# Patient Record
Sex: Female | Born: 1945 | Hispanic: Yes | Marital: Single | Smoking: Never smoker
Health system: Southern US, Community
[De-identification: ages and names within clinical notes are randomized; demographics above are authoritative.]

## PROBLEM LIST (undated history)

## (undated) DIAGNOSIS — E119 Type 2 diabetes mellitus without complications: Secondary | ICD-10-CM

## (undated) DIAGNOSIS — I10 Essential (primary) hypertension: Secondary | ICD-10-CM

## (undated) DIAGNOSIS — G40109 Localization-related (focal) (partial) symptomatic epilepsy and epileptic syndromes with simple partial seizures, not intractable, without status epilepticus: Secondary | ICD-10-CM

## (undated) DIAGNOSIS — E785 Hyperlipidemia, unspecified: Secondary | ICD-10-CM

## (undated) DIAGNOSIS — I5032 Chronic diastolic (congestive) heart failure: Secondary | ICD-10-CM

## (undated) DIAGNOSIS — J449 Chronic obstructive pulmonary disease, unspecified: Secondary | ICD-10-CM

## (undated) DIAGNOSIS — I495 Sick sinus syndrome: Secondary | ICD-10-CM

## (undated) DIAGNOSIS — I890 Lymphedema, not elsewhere classified: Secondary | ICD-10-CM

## (undated) DIAGNOSIS — I639 Cerebral infarction, unspecified: Secondary | ICD-10-CM

## (undated) HISTORY — PX: OTHER SURGICAL HISTORY: SHX169

## (undated) HISTORY — PX: APPENDECTOMY: SHX54

## (undated) HISTORY — PX: HERNIA REPAIR: SHX51

## (undated) HISTORY — PX: TONSILLECTOMY: SUR1361

## (undated) HISTORY — PX: BREAST SURGERY: SHX581

## (undated) HISTORY — PX: PACEMAKER IMPLANT: EP1218

---

## 2017-12-20 DIAGNOSIS — Z950 Presence of cardiac pacemaker: Secondary | ICD-10-CM | POA: Insufficient documentation

## 2017-12-20 DIAGNOSIS — Z8673 Personal history of transient ischemic attack (TIA), and cerebral infarction without residual deficits: Secondary | ICD-10-CM | POA: Insufficient documentation

## 2017-12-20 DIAGNOSIS — I10 Essential (primary) hypertension: Secondary | ICD-10-CM | POA: Insufficient documentation

## 2017-12-20 DIAGNOSIS — H409 Unspecified glaucoma: Secondary | ICD-10-CM | POA: Insufficient documentation

## 2017-12-20 DIAGNOSIS — G40909 Epilepsy, unspecified, not intractable, without status epilepticus: Secondary | ICD-10-CM | POA: Insufficient documentation

## 2017-12-20 DIAGNOSIS — R569 Unspecified convulsions: Secondary | ICD-10-CM | POA: Insufficient documentation

## 2017-12-20 DIAGNOSIS — G4730 Sleep apnea, unspecified: Secondary | ICD-10-CM | POA: Insufficient documentation

## 2018-12-12 ENCOUNTER — Other Ambulatory Visit: Payer: Self-pay | Admitting: Family Medicine

## 2018-12-12 DIAGNOSIS — Z13820 Encounter for screening for osteoporosis: Secondary | ICD-10-CM

## 2018-12-12 DIAGNOSIS — Z1231 Encounter for screening mammogram for malignant neoplasm of breast: Secondary | ICD-10-CM

## 2019-11-19 ENCOUNTER — Encounter: Payer: Medicare HMO | Attending: Physician Assistant | Admitting: Physician Assistant

## 2019-11-19 ENCOUNTER — Other Ambulatory Visit: Payer: Self-pay

## 2019-11-19 DIAGNOSIS — I872 Venous insufficiency (chronic) (peripheral): Secondary | ICD-10-CM | POA: Insufficient documentation

## 2019-11-19 DIAGNOSIS — L97822 Non-pressure chronic ulcer of other part of left lower leg with fat layer exposed: Secondary | ICD-10-CM | POA: Insufficient documentation

## 2019-11-19 DIAGNOSIS — L97812 Non-pressure chronic ulcer of other part of right lower leg with fat layer exposed: Secondary | ICD-10-CM | POA: Diagnosis not present

## 2019-11-19 DIAGNOSIS — E11622 Type 2 diabetes mellitus with other skin ulcer: Secondary | ICD-10-CM | POA: Diagnosis not present

## 2019-11-19 DIAGNOSIS — E669 Obesity, unspecified: Secondary | ICD-10-CM | POA: Insufficient documentation

## 2019-11-19 DIAGNOSIS — Z950 Presence of cardiac pacemaker: Secondary | ICD-10-CM | POA: Insufficient documentation

## 2019-11-19 DIAGNOSIS — Z6841 Body Mass Index (BMI) 40.0 and over, adult: Secondary | ICD-10-CM | POA: Insufficient documentation

## 2019-11-19 DIAGNOSIS — I110 Hypertensive heart disease with heart failure: Secondary | ICD-10-CM | POA: Insufficient documentation

## 2019-11-19 DIAGNOSIS — I890 Lymphedema, not elsewhere classified: Secondary | ICD-10-CM | POA: Insufficient documentation

## 2019-11-19 DIAGNOSIS — L97819 Non-pressure chronic ulcer of other part of right lower leg with unspecified severity: Secondary | ICD-10-CM | POA: Diagnosis present

## 2019-11-19 DIAGNOSIS — I5042 Chronic combined systolic (congestive) and diastolic (congestive) heart failure: Secondary | ICD-10-CM | POA: Insufficient documentation

## 2019-11-19 NOTE — Progress Notes (Signed)
SHUNTE, Grace Short ([id number]) Visit Report for 11/19/2019 Allergy List Details Patient Name: Grace, Short Date of Service: 11/19/2019 8:00 AM Medical Record Number: [id number] Patient Account Number: [id number] Date of Birth/Sex: 02-10-1946 (74 y.o. F) Treating RN: Huel Coventry Primary Care Eltha Tingley: Dala Dock Other Clinician: Referring Ellias Mcelreath: Dorothyann Peng Treating Ashtyn Freilich/Extender: STONE III, HOYT Weeks in Treatment: 0 Allergies Active Allergies Ativan Reaction: too sedating Allergy Notes Electronic Signature(s) Signed: 11/19/2019 11:49:13 AM By: Benna Dunks Entered By: Benna Dunks on 11/19/2019 09:16:18 Grace Short ([id number]) -------------------------------------------------------------------------------- Arrival Information Details Patient Name: Grace Short Date of Service: 11/19/2019 8:00 AM Medical Record Number: [id number] Patient Account Number: [id number] Date of Birth/Sex: 1945-05-26 (74 y.o. F) Treating RN: Huel Coventry Primary Care Massimiliano Rohleder: Dala Dock Other Clinician: Referring Rozetta Stumpp: Dorothyann Peng Treating Hansel Devan/Extender: Linwood Dibbles, HOYT Weeks in Treatment: 0 Visit Information Patient Arrived: Wheel Chair Arrival Time: 08:39 Accompanied By: friend Transfer Assistance: None Patient Identification Verified: Yes Secondary Verification Process Completed: Yes Electronic Signature(s) Signed: 11/19/2019 11:49:13 AM By: Benna Dunks Entered By: Benna Dunks on 11/19/2019 09:01:53 Grace Short ([id number]) -------------------------------------------------------------------------------- Clinic Level of Care Assessment Details Patient Name: Grace Short Date of Service: 11/19/2019 8:00 AM Medical Record Number: [id number] Patient Account Number: [id number] Date of Birth/Sex: Jun 03, 1945 (74 y.o. F) Treating RN: Tyler Aas Primary Care Ryon Layton: Dala Dock Other Clinician: Referring Dejan Angert: Dorothyann Peng Treating  Valicia Rief/Extender: Linwood Dibbles, HOYT Weeks in Treatment: 0 Clinic Level of Care Assessment Items TOOL 1 Quantity Score []  - Use when EandM and Procedure is performed on INITIAL visit 0 ASSESSMENTS - Nursing Assessment / Reassessment X - General Physical Exam (combine w/ comprehensive assessment (listed just below) when performed on new 1 20 pt. evals) X- 1 25 Comprehensive Assessment (HX, ROS, Risk Assessments, Wounds Hx, etc.) ASSESSMENTS - Wound and Skin Assessment / Reassessment []  - Dermatologic / Skin Assessment (not related to wound area) 0 ASSESSMENTS - Ostomy and/or Continence Assessment and Care []  - Incontinence Assessment and Management 0 []  - 0 Ostomy Care Assessment and Management (repouching, etc.) PROCESS - Coordination of Care []  - Simple Patient / Family Education for ongoing care 0 X- 1 20 Complex (extensive) Patient / Family Education for ongoing care X- 1 10 Staff obtains , Records, Test Results / Process Orders []  - 0 Staff telephones HHA, Nursing Homes / Clarify orders / etc []  - 0 Routine Transfer to another Facility (non-emergent condition) []  - 0 Routine Hospital Admission (non-emergent condition) X- 1 15 New Admissions / / Ordering NPWT, Apligraf, etc. []  - 0 Emergency Hospital Admission (emergent condition) PROCESS - Special Needs []  - Pediatric / Minor Patient Management 0 []  - 0 Isolation Patient Management []  - 0 Hearing / Language / Visual special needs []  - 0 Assessment of Community assistance (transportation, D/C planning, etc.) []  - 0 Additional assistance / Altered mentation []  - 0 Support Surface(s) Assessment (bed, cushion, seat, etc.) INTERVENTIONS - Miscellaneous []  - External ear exam 0 []  - 0 Patient Transfer (multiple staff / / Similar devices) []  - 0 Simple Staple / Suture removal (25 or less) []  - 0 Complex Staple / Suture removal (26 or more) []  - 0 Hypo/Hyperglycemic  Management (do not check if billed separately) []  - 0 Ankle / Brachial Index (ABI) - do not check if billed separately Has the patient been seen at the hospital within the last three years: Yes Total Score: 90 Level Of Care: New/Established - Level 3 Grace Short, Grace V. ( )  Electronic Signature(s) Signed: 11/19/2019 4:44:41 PM By: Tyler Aas Entered By: Tyler Aas on 11/19/2019 10:17:11 Grace Short ([id number]) -------------------------------------------------------------------------------- Compression Therapy Details Patient Name: Grace Short Date of Service: 11/19/2019 8:00 AM Medical Record Number: [id number] Patient Account Number: [id number] Date of Birth/Sex: March 16, 1945 (74 y.o. F) Treating RN: Tyler Aas Primary Care Jakeira Seeman: Dala Dock Other Clinician: Referring Tison Leibold: Dorothyann Peng Treating Shelva Hetzer/Extender: Linwood Dibbles, HOYT Weeks in Treatment: 0 Compression Therapy Performed for Wound Assessment: Wound #1 Right,Distal,Medial Lower Leg Performed By: Clinician Tyler Aas, RN Compression Type: Henriette Combs Post Procedure Diagnosis Same as Pre-procedure Electronic Signature(s) Signed: 11/19/2019 4:44:41 PM By: Tyler Aas Entered By: Tyler Aas on 11/19/2019 10:16:20 Grace Short ([id number]) -------------------------------------------------------------------------------- Compression Therapy Details Patient Name: Grace Short Date of Service: 11/19/2019 8:00 AM Medical Record Number: [id number] Patient Account Number: [id number] Date of Birth/Sex: 07/28/1945 (74 y.o. F) Treating RN: Tyler Aas Primary Care Timea Breed: Dala Dock Other Clinician: Referring Leotha Westermeyer: Dorothyann Peng Treating Madia Carvell/Extender: Linwood Dibbles, HOYT Weeks in Treatment: 0 Compression Therapy Performed for Wound Assessment: Wound #2 Left,Distal,Medial Lower Leg Performed By: Clinician Tyler Aas, RN Compression Type: Henriette Combs Post  Procedure Diagnosis Same as Pre-procedure Electronic Signature(s) Signed: 11/19/2019 4:44:41 PM By: Tyler Aas Entered By: Tyler Aas on 11/19/2019 10:16:21 Grace Short ([id number]) -------------------------------------------------------------------------------- Encounter Discharge Information Details Patient Name: Grace Short Date of Service: 11/19/2019 8:00 AM Medical Record Number: [id number] Patient Account Number: [id number] Date of Birth/Sex: Jul 06, 1945 (74 y.o. F) Treating RN: Tyler Aas Primary Care Parlee Amescua: Dala Dock Other Clinician: Referring Chipper Koudelka: Dorothyann Peng Treating Adon Gehlhausen/Extender: Linwood Dibbles, HOYT Weeks in Treatment: 0 Encounter Discharge Information Items Discharge Condition: Stable Ambulatory Status: Wheelchair Discharge Destination: Home Transportation: Other Accompanied By: caregiver Schedule Follow-up Appointment: Yes Clinical Summary of Care: Electronic Signature(s) Signed: 11/19/2019 4:44:41 PM By: Tyler Aas Entered By: Tyler Aas on 11/19/2019 10:18:35 Grace Short ([id number]) -------------------------------------------------------------------------------- Lower Extremity Assessment Details Patient Name: Grace Short Date of Service: 11/19/2019 8:00 AM Medical Record Number: [id number] Patient Account Number: [id number] Date of Birth/Sex: 11/30/1945 (74 y.o. F) Treating RN: Huel Coventry Primary Care Maryln Eastham: Dala Dock Other Clinician: Referring Shelba Susi: Dorothyann Peng Treating Keniel Ralston/Extender: Linwood Dibbles, HOYT Weeks in Treatment: 0 Edema Assessment Assessed: [Left: Yes] [Right: Yes] Edema: [Left: Yes] [Right: Yes] Calf Left: Right: Point of Measurement: 24 cm From Medial Instep 55 cm 54 cm Ankle Left: Right: Point of Measurement: 12 cm From Medial Instep 44 cm 49 cm Vascular Assessment Pulses: Dorsalis Pedis Palpable: [Left:No] [Right:No] Doppler Audible: [Left:Inaudible]  [Right:Inaudible] Posterior Tibial Palpable: [Left:No Inaudible] [Right:No Inaudible] Notes Unable to obtain ABIs due to significant edema and inaudible doppler pulses. Has referral with vascular from her cardiologist. Electronic Signature(s) Signed: 11/19/2019 11:49:13 AM By: Benna Dunks Signed: 11/19/2019 4:28:55 PM By: Elliot Gurney, BSN, RN, CWS, Kim RN, BSN Entered By: Benna Dunks on 11/19/2019 09:15:26 Grace Short ([id number]) -------------------------------------------------------------------------------- Multi Wound Chart Details Patient Name: Grace Short Date of Service: 11/19/2019 8:00 AM Medical Record Number: [id number] Patient Account Number: [id number] Date of Birth/Sex: [birth year] (74 y.o. F) Treating RN: Tyler Aas Primary Care Christabel Camire: Dala Dock Other Clinician: Referring Rozalynn Buege: Dorothyann Peng Treating Kentrail Shew/Extender: Linwood Dibbles, HOYT Weeks in Treatment: 0 Vital Signs Height(in): 62 Pulse(bpm): 73 Weight(lbs): 300 Blood Pressure(mmHg): 205/83 Body Mass Index(BMI): 55 Temperature(F): 98.3 Respiratory Rate(breaths/min): 20 Photos: [N/A:N/A] Wound Location: Right, Distal, Medial Lower Leg Left, Distal, Medial Lower Leg N/A Wounding Event: Gradually Appeared Gradually Appeared N/A Primary Etiology: Lymphedema Lymphedema N/A Date Acquired: 11/12/2019 11/12/2019 N/A  Weeks of Treatment: 0 0 N/A Wound Status: Open Open N/A Measurements L x W x D (cm) 5.5x10x0.1 6x6x0.1 N/A Area (cm) : 43.197 28.274 N/A Volume (cm) : 4.32 2.827 N/A % Reduction in Area: -52.80% N/A N/A % Reduction in Volume: -52.80% N/A N/A Classification: Partial Thickness Partial Thickness N/A Exudate Amount: Large Large N/A Exudate Type: Serous Serous N/A Exudate Color: amber amber N/A Foul Odor After Cleansing: Yes Yes N/A Odor Anticipated Due to Product No No N/A Use: Wound Margin: Distinct, outline attached Distinct, outline attached N/A Granulation Amount: Small  (1-33%) Small (1-33%) N/A Granulation Quality: Pink Pink N/A Necrotic Amount: Large (67-100%) Large (67-100%) N/A Exposed Structures: Fat Layer (Subcutaneous Tissue): Fat Layer (Subcutaneous Tissue): N/A Yes Yes Fascia: No Fascia: No Tendon: No Tendon: No Muscle: No Muscle: No Joint: No Joint: No Bone: No Bone: No Epithelialization: Small (1-33%) Small (1-33%) N/A Treatment Notes Electronic Signature(s) Signed: 11/19/2019 4:44:41 PM By: Tyler Aas Entered By: Tyler Aas on 11/19/2019 10:00:19 Grace Short ([id number]) -------------------------------------------------------------------------------- Multi-Disciplinary Care Plan Details Patient Name: Grace Short Date of Service: 11/19/2019 8:00 AM Medical Record Number: [id number] Patient Account Number: [id number] Date of Birth/Sex: 1946/02/06 (74 y.o. F) Treating RN: Huel Coventry Primary Care Merline Perkin: Dala Dock Other Clinician: Referring Josefina Rynders: Dorothyann Peng Treating Jonne Rote/Extender: Linwood Dibbles, HOYT Weeks in Treatment: 0 Active Inactive Orientation to the Wound Care Program Nursing Diagnoses: Knowledge deficit related to the wound healing center program Goals: Patient/caregiver will verbalize understanding of the Wound Healing Center Program Date Initiated: 11/19/2019 Target Resolution Date: 11/26/2019 Goal Status: Active Interventions: Provide education on orientation to the wound center Notes: Wound/Skin Impairment Nursing Diagnoses: Impaired tissue integrity Knowledge deficit related to ulceration/compromised skin integrity Goals: Patient/caregiver will verbalize understanding of skin care regimen Date Initiated: 11/19/2019 Target Resolution Date: 12/03/2019 Goal Status: Active Interventions: Assess patient/caregiver ability to obtain necessary supplies Assess patient/caregiver ability to perform ulcer/skin care regimen upon admission and as needed Assess ulceration(s) every  visit Provide education on ulcer and skin care Treatment Activities: Skin care regimen initiated : 11/19/2019 Topical wound management initiated : 11/19/2019 Notes: Electronic Signature(s) Signed: 11/19/2019 4:28:55 PM By: Elliot Gurney, BSN, RN, CWS, Kim RN, BSN Signed: 11/19/2019 4:44:41 PM By: Tyler Aas Entered By: Tyler Aas on 11/19/2019 09:59:11 Grace Short ([id number]) -------------------------------------------------------------------------------- Pain Assessment Details Patient Name: Grace Short Date of Service: 11/19/2019 8:00 AM Medical Record Number: [id number] Patient Account Number: [id number] Date of Birth/Sex: [birth year] (74 y.o. F) Treating RN: Huel Coventry Primary Care Brennin Durfee: Dala Dock Other Clinician: Referring Stevi Hollinshead: Dorothyann Peng Treating Haifa Hatton/Extender: Linwood Dibbles, HOYT Weeks in Treatment: 0 Active Problems Location of Pain Severity and Description of Pain Patient Has Paino Yes Site Locations Rate the pain. Current Pain Level: 5 Pain Management and Medication Current Pain Management: Electronic Signature(s) Signed: 11/19/2019 11:49:13 AM By: Benna Dunks Signed: 11/19/2019 4:28:55 PM By: Elliot Gurney, BSN, RN, CWS, Kim RN, BSN Entered By: Benna Dunks on 11/19/2019 09:02:02 Grace Short ([id number]) -------------------------------------------------------------------------------- Patient/Caregiver Education Details Patient Name: Grace Short Date of Service: 11/19/2019 8:00 AM Medical Record Number: [id number] Patient Account Number: [id number] Date of Birth/Gender: 24-Oct-1945 (74 y.o. F) Treating RN: Tyler Aas Primary Care Physician: Dala Dock Other Clinician: Referring Physician: Dorothyann Peng Treating Physician/Extender: Skeet Simmer in Treatment: 0 Education Assessment Education Provided To: Patient and Caregiver Education Topics Provided Welcome To The Wound Care Center: Handouts: Welcome To The Wound  Care Center Methods: Explain/Verbal Responses: State content correctly Wound/Skin Impairment: Handouts: Caring for Your Ulcer  Methods: Demonstration Responses: State content correctly Electronic Signature(s) Signed: 11/19/2019 4:44:41 PM By: Tyler AasButler, Michelle Entered By: Tyler AasButler, Michelle on 11/19/2019 09:59:46 Grace Short, Grace Short V. ([id number]) -------------------------------------------------------------------------------- Wound Assessment Details Patient Name: Grace Short, Grace V. Date of Service: 11/19/2019 8:00 AM Medical Record Number: [id number] Patient Account Number: [id number] Date of Birth/Sex: 20-Mar-1945 (74 y.o. F) Treating RN: Huel CoventryWoody, Kim Primary Care Magnum Lunde: Dala DockMARKLY, LINDA Other Clinician: Referring Heitor Steinhoff: Dorothyann PengALLWOOD, DWAYNE Treating Addie Alonge/Extender: STONE III, HOYT Weeks in Treatment: 0 Wound Status Wound Number: 1 Primary Lymphedema Etiology: Wound Location: Right, Distal, Medial Lower Leg Wound Open Wounding Event: Gradually Appeared Status: Date Acquired: 11/12/2019 Notes: Stated her legs started weeping about a week ago and Weeks Of Treatment: 0 ulceration appeared. Clustered Wound: No Photos Wound Measurements Length: (cm) 5.5 Width: (cm) 10 Depth: (cm) 0.1 Area: (cm) 43.197 Volume: (cm) 4.32 % Reduction in Area: -52.8% % Reduction in Volume: -52.8% Epithelialization: Small (1-33%) Tunneling: No Undermining: No Wound Description Classification: Partial Thickness Wound Margin: Distinct, outline attached Exudate Amount: Large Exudate Type: Serous Exudate Color: amber Foul Odor After Cleansing: Yes Due to Product Use: No Slough/Fibrino Yes Wound Bed Granulation Amount: Small (1-33%) Exposed Structure Granulation Quality: Pink Fascia Exposed: No Necrotic Amount: Large (67-100%) Fat Layer (Subcutaneous Tissue) Exposed: Yes Necrotic Quality: Adherent Slough Tendon Exposed: No Muscle Exposed: No Joint Exposed: No Bone Exposed: No Treatment Notes Wound  #1 (Right, Distal, Medial Lower Leg) Notes scell, xsorb (abd to bilateral lower extremity folds prior to unna boot application, unna bilateral Electronic Signature(s) Signed: 11/19/2019 11:49:13 AM By: Tommy RainwaterLineberry, Carol Rentz, Marchel V. ([id number]) Signed: 11/19/2019 4:28:55 PM By: Elliot GurneyWoody, BSN, RN, CWS, Kim RN, BSN Entered By: Benna DunksLineberry, Carol on 11/19/2019 09:10:56 Grace Short, Grace V. ([id number]) -------------------------------------------------------------------------------- Wound Assessment Details Patient Name: Grace Short, Malayjah V. Date of Service: 11/19/2019 8:00 AM Medical Record Number: [id number] Patient Account Number: [id number] Date of Birth/Sex: 20-Mar-1945 (74 y.o. F) Treating RN: Huel CoventryWoody, Kim Primary Care Joellyn Grandt: Dala DockMARKLY, LINDA Other Clinician: Referring Anyssa Sharpless: Dorothyann PengALLWOOD, DWAYNE Treating Ricca Melgarejo/Extender: Linwood DibblesSTONE III, HOYT Weeks in Treatment: 0 Wound Status Wound Number: 2 Primary Lymphedema Etiology: Wound Location: Left, Distal, Medial Lower Leg Wound Status: Open Wounding Event: Gradually Appeared Notes: states weeping started aprox one week ago and Date Acquired: 11/12/2019 development of ulcer Weeks Of Treatment: 0 Clustered Wound: No Photos Wound Measurements Length: (cm) 6 Width: (cm) 6 Depth: (cm) 0.1 Area: (cm) 28.274 Volume: (cm) 2.827 % Reduction in Area: % Reduction in Volume: Epithelialization: Small (1-33%) Tunneling: No Undermining: No Wound Description Classification: Partial Thickness Wound Margin: Distinct, outline attached Exudate Amount: Large Exudate Type: Serous Exudate Color: amber Foul Odor After Cleansing: Yes Due to Product Use: No Slough/Fibrino Yes Wound Bed Granulation Amount: Small (1-33%) Exposed Structure Granulation Quality: Pink Fascia Exposed: No Necrotic Amount: Large (67-100%) Fat Layer (Subcutaneous Tissue) Exposed: Yes Necrotic Quality: Adherent Slough Tendon Exposed: No Muscle Exposed: No Joint Exposed: No Bone Exposed:  No Treatment Notes Wound #2 (Left, Distal, Medial Lower Leg) Notes scell, xsorb (abd to bilateral lower extremity folds prior to unna boot application, unna bilateral Electronic Signature(s) Signed: 11/19/2019 11:49:13 AM By: Tommy RainwaterLineberry, Carol Mcgeachy, Shali V. ([id number]) Signed: 11/19/2019 4:28:55 PM By: Elliot GurneyWoody, BSN, RN, CWS, Kim RN, BSN Entered By: Benna DunksLineberry, Carol on 11/19/2019 09:10:20 Grace Short, Grace V. ([id number]) -------------------------------------------------------------------------------- Vitals Details Patient Name: Grace Short, Grace V. Date of Service: 11/19/2019 8:00 AM Medical Record Number: [id number] Patient Account Number: [id number] Date of Birth/Sex: 20-Mar-1945 (74 y.o. F) Treating RN: Huel CoventryWoody, Kim Primary Care Sheran Newstrom: Dala DockMARKLY, LINDA Other Clinician: Referring Nadya Hopwood: Juliann ParesALLWOOD,  DWAYNE Treating Dametria Tuzzolino/Extender: STONE III, HOYT Weeks in Treatment: 0 Vital Signs Time Taken: 08:35 Temperature (F): 98.3 Height (in): 62 Pulse (bpm): 73 Source: Stated Respiratory Rate (breaths/min): 20 Weight (lbs): 300 Blood Pressure (mmHg): 205/83 Source: Stated Reference Range: 80 - 120 mg / dl Body Mass Index (BMI): 54.9 Electronic Signature(s) Signed: 11/19/2019 11:49:13 AM By: Benna Dunks Entered By: Benna Dunks on 11/19/2019 09:02:07

## 2019-11-19 NOTE — Progress Notes (Signed)
CREEDENCE, HEISS ([id number]) Visit Report for 11/19/2019 Abuse/Suicide Risk Screen Details Patient Name: Grace Short, Grace Short Date of Service: 11/19/2019 8:00 AM Medical Record Number: [id number] Patient Account Number: [id number] Date of Birth/Sex: 24-Sep-1945 (74 y.o. F) Treating RN: Huel Coventry Primary Care Kimoni Pickerill: Dala Dock Other Clinician: Referring Tyler Cubit: Dorothyann Peng Treating Areej Tayler/Extender: Linwood Dibbles, HOYT Weeks in Treatment: 0 Abuse/Suicide Risk Screen Items Answer ABUSE RISK SCREEN: Has anyone close to you tried to hurt or harm you recentlyo No Do you feel uncomfortable with anyone in your familyo No Has anyone forced you do things that you didnot want to doo No Electronic Signature(s) Signed: 11/19/2019 11:49:13 AM By: Benna Dunks Signed: 11/19/2019 4:28:55 PM By: Elliot Gurney, BSN, RN, CWS, Kim RN, BSN Entered By: Benna Dunks on 11/19/2019 09:29:33 Grace Short ([id number]) -------------------------------------------------------------------------------- Activities of Daily Living Details Patient Name: Grace Short Date of Service: 11/19/2019 8:00 AM Medical Record Number: [id number] Patient Account Number: [id number] Date of Birth/Sex: [birth year] (74 y.o. F) Treating RN: Huel Coventry Primary Care Thierno Hun: Dala Dock Other Clinician: Referring Elyce Zollinger: Dorothyann Peng Treating Tashera Montalvo/Extender: Linwood Dibbles, HOYT Weeks in Treatment: 0 Activities of Daily Living Items Answer Activities of Daily Living (Please select one for each item) Drive Automobile Not Able Take Medications Need Assistance Use Telephone Completely Able Care for Appearance Need Assistance Use Toilet Need Assistance Bath / Shower Need Assistance Dress Self Need Assistance Feed Self Completely Able Walk Need Assistance Get In / Out Bed Need Assistance Housework Need Assistance Prepare Meals Need Assistance Handle Money Need Assistance Shop for Self Need Assistance Electronic  Signature(s) Signed: 11/19/2019 11:49:13 AM By: Benna Dunks Signed: 11/19/2019 4:28:55 PM By: Elliot Gurney, BSN, RN, CWS, Kim RN, BSN Entered By: Benna Dunks on 11/19/2019 09:31:34 Grace Short ([id number]) -------------------------------------------------------------------------------- Education Screening Details Patient Name: Grace Short Date of Service: 11/19/2019 8:00 AM Medical Record Number: [id number] Patient Account Number: [id number] Date of Birth/Sex: 1945/08/12 (74 y.o. F) Treating RN: Huel Coventry Primary Care Carrol Hougland: Dala Dock Other Clinician: Referring Darik Massing: Dorothyann Peng Treating Charnee Turnipseed/Extender: Linwood Dibbles, HOYT Weeks in Treatment: 0 Learning Preferences/Education Level/Primary Language Learning Preference: Explanation Highest Education Level: High School Preferred Language: English Cognitive Barrier Language Barrier: No Translator Needed: No Memory Deficit: No Emotional Barrier: No Cultural/Religious Beliefs Affecting Medical Care: No Physical Barrier Impaired Vision: No Impaired Hearing: No Decreased Hand dexterity: No Knowledge/Comprehension Knowledge Level: Medium Comprehension Level: Medium Ability to understand written instructions: Medium Ability to understand verbal instructions: Medium Motivation Anxiety Level: Calm Cooperation: Cooperative Education Importance: Acknowledges Need Interest in Health Problems: Asks Questions Perception: Coherent Willingness to Engage in Self-Management Medium Activities: Readiness to Engage in Self-Management Medium Activities: Electronic Signature(s) Signed: 11/19/2019 11:49:13 AM By: Benna Dunks Signed: 11/19/2019 4:28:55 PM By: Elliot Gurney, BSN, RN, CWS, Kim RN, BSN Entered By: Benna Dunks on 11/19/2019 09:32:30 Grace Short ([id number]) -------------------------------------------------------------------------------- Fall Risk Assessment Details Patient Name: Grace Short Date of  Service: 11/19/2019 8:00 AM Medical Record Number: [id number] Patient Account Number: [id number] Date of Birth/Sex: 06-25-1945 (74 y.o. F) Treating RN: Huel Coventry Primary Care Christopherjame Carnell: Dala Dock Other Clinician: Referring Harlo Fabela: Dorothyann Peng Treating Sira Adsit/Extender: Linwood Dibbles, HOYT Weeks in Treatment: 0 Fall Risk Assessment Items Have you had 2 or more falls in the last 12 monthso 0 Yes Have you had any fall that resulted in injury in the last 12 monthso 0 Yes FALLS RISK SCREEN History of falling - immediate or within 3 months 0 No Secondary diagnosis (Do you have 2 or more medical  diagnoseso) 0 No Ambulatory aid None/bed rest/wheelchair/nurse 0 Yes Crutches/cane/walker 0 No Furniture 0 No Intravenous therapy Access/Saline/Heparin Lock 0 No Gait/Transferring Normal/ bed rest/ wheelchair 0 Yes Weak (short steps with or without shuffle, stooped but able to lift head while walking, may 10 Yes seek support from furniture) Impaired (short steps with shuffle, may have difficulty arising from chair, head down, impaired 0 No balance) Mental Status Oriented to own ability 0 Yes Electronic Signature(s) Signed: 11/19/2019 11:49:13 AM By: Benna Dunks Signed: 11/19/2019 4:28:55 PM By: Elliot Gurney, BSN, RN, CWS, Kim RN, BSN Entered By: Benna Dunks on 11/19/2019 09:33:28 Grace Short ([id number]) -------------------------------------------------------------------------------- Foot Assessment Details Patient Name: Grace Short Date of Service: 11/19/2019 8:00 AM Medical Record Number: [id number] Patient Account Number: [id number] Date of Birth/Sex: [birth year] (74 y.o. F) Treating RN: Huel Coventry Primary Care Anees Vanecek: Dala Dock Other Clinician: Referring Ahmoni Edge: Dorothyann Peng Treating Damien Batty/Extender: Linwood Dibbles, HOYT Weeks in Treatment: 0 Foot Assessment Items Site Locations + = Sensation present, - = Sensation absent, C = Callus, U = Ulcer R = Redness, W =  Warmth, M = Maceration, PU = Pre-ulcerative lesion F = Fissure, S = Swelling, D = Dryness Assessment Right: Left: Other Deformity: No No Prior Foot Ulcer: No No Prior Amputation: No No Charcot Joint: No No Ambulatory Status: Ambulatory With Help Assistance Device: Walker Gait: Surveyor, mining) Signed: 11/19/2019 11:49:13 AM By: Benna Dunks Signed: 11/19/2019 4:28:55 PM By: Elliot Gurney, BSN, RN, CWS, Kim RN, BSN Entered By: Benna Dunks on 11/19/2019 09:35:59 Grace Short ([id number]) -------------------------------------------------------------------------------- Nutrition Risk Screening Details Patient Name: Grace Short Date of Service: 11/19/2019 8:00 AM Medical Record Number: [id number] Patient Account Number: [id number] Date of Birth/Sex: 1945/11/18 (74 y.o. F) Treating RN: Huel Coventry Primary Care Mckenzee Beem: Dala Dock Other Clinician: Referring Mithcell Schumpert: Dorothyann Peng Treating Sonita Michiels/Extender: Linwood Dibbles, HOYT Weeks in Treatment: 0 Height (in): 62 Weight (lbs): 300 Body Mass Index (BMI): 54.9 Nutrition Risk Screening Items Score Screening NUTRITION RISK SCREEN: I have an illness or condition that made me change the kind and/or amount of food I eat 0 No I eat fewer than two meals per day 0 No I eat few fruits and vegetables, or milk products 0 No I have three or more drinks of beer, liquor or wine almost every day 0 No I have tooth or mouth problems that make it hard for me to eat 0 No I don't always have enough money to buy the food I need 0 No I eat alone most of the time 0 No I take three or more different prescribed or over-the-counter drugs a day 1 Yes Without wanting to, I have lost or gained 10 pounds in the last six months 0 No I am not always physically able to shop, cook and/or feed myself 0 No Nutrition Protocols Good Risk Protocol 0 No interventions needed Moderate Risk Protocol High Risk Proctocol Risk Level: Good Risk Score:  1 Electronic Signature(s) Signed: 11/19/2019 11:49:13 AM By: Benna Dunks Signed: 11/19/2019 4:28:55 PM By: Elliot Gurney, BSN, RN, CWS, Kim RN, BSN Entered By: Benna Dunks on 11/19/2019 09:33:58

## 2019-11-19 NOTE — Progress Notes (Addendum)
JAMAIA, BRUM ([id number]) Visit Report for 11/19/2019 Chief Complaint Document Details Patient Name: Grace Short, Grace Short Date of Service: 11/19/2019 8:00 AM Medical Record Number: [id number] Patient Account Number: [id number] Date of Birth/Sex: [birth year] (74 y.o. F) Treating RN: Huel Coventry Primary Care Provider: Dala Dock Other Clinician: Referring Provider: Dala Dock Treating Provider/Extender: Linwood Dibbles, Ajiah Mcglinn Weeks in Treatment: 0 Information Obtained from: Patient Chief Complaint Bilateral LE Ulcers Electronic Signature(s) Signed: 11/19/2019 9:52:02 AM By: Lenda Kelp PA-C Entered By: Lenda Kelp on 11/19/2019 09:52:02 Grace Short ([id number]) -------------------------------------------------------------------------------- HPI Details Patient Name: Grace Short Date of Service: 11/19/2019 8:00 AM Medical Record Number: [id number] Patient Account Number: [id number] Date of Birth/Sex: October 10, 1945 (74 y.o. F) Treating RN: Tyler Aas Primary Care Provider: Dala Dock Other Clinician: Referring Provider: Dala Dock Treating Provider/Extender: Linwood Dibbles, Tona Qualley Weeks in Treatment: 0 History of Present Illness HPI Description: 11/19/19 upon evaluation today patient appears to be present for initial inspection here clinic due to bilateral lower extremity lymphedema where she has open areas noted on the medial aspects of her lower legs bilaterally. Fortunately there is no signs of active infection at this time. She's been tolerating the dressing changes without complication. With that being said I do feel like that the patient is somewhat uncontrolled in regard to her lymphedema I think there are a couple options we could consider in this regard will discuss those greater in the plan. She does have a history again of lymphedema, chronic venous insufficiency, diabetes mellitus type II, she has a cardiac pacemaker, hypertension, and chronic congestive heart failure.  Currently the patient has not had any compression of any kind she fairly recently over the past couple of years move from up [place]. She has not been taught lymphedema clinic up to this point. Electronic Signature(s) Signed: 11/30/2019 9:48:54 AM By: Lenda Kelp PA-C Entered By: Lenda Kelp on 11/30/2019 09:33:13 Grace Short ([id number]) -------------------------------------------------------------------------------- Physical Exam Details Patient Name: Grace Short Date of Service: 11/19/2019 8:00 AM Medical Record Number: [id number] Patient Account Number: [id number] Date of Birth/Sex: 02-17-1946 (74 y.o. F) Treating RN: Tyler Aas Primary Care Provider: Dala Dock Other Clinician: Referring Provider: Dala Dock Treating Provider/Extender: STONE III, Judit Awad Weeks in Treatment: 0 Constitutional patient is hypertensive.. pulse regular and within target range for patient.Marland Kitchen respirations regular, non-labored and within target range for patient.Marland Kitchen temperature within target range for patient.. Well-nourished and well-hydrated in no acute distress. Eyes conjunctiva clear no eyelid edema noted. pupils equal round and reactive to light and accommodation. Ears, Nose, Mouth, and Throat no gross abnormality of ear auricles or external auditory canals. normal hearing noted during conversation. mucus membranes moist. Respiratory normal breathing without difficulty. Cardiovascular Absent posterior tibial and dorsalis pedis pulses bilateral lower extremities The pulses were difficult to obtain due to the fact that the patient is so swollen.. Patient has bilateral lower extremity stage III lymphedema. Musculoskeletal Patient unable to walk without assistance. no significant deformity or arthritic changes, no loss or range of motion, no clubbing. Psychiatric this patient is able to make decisions and demonstrates good insight into disease process. Alert and Oriented x 3. pleasant and  cooperative. Notes Upon inspection patient had open areas on the insides of both lower extremities at this point which is secondary to the chronic lymphedema. I do believe that her swelling is a little bit worse more recently which is probably why the area started opening and draining. Nonetheless I think this does need to be addressed as  soon as possible to try to keep the edema down and the wounds healed. Electronic Signature(s) Signed: 11/30/2019 9:48:54 AM By: Lenda Kelp PA-C Entered By: Lenda Kelp on 11/30/2019 09:35:17 Grace Short ([id number]) -------------------------------------------------------------------------------- Physician Orders Details Patient Name: Grace Short Date of Service: 11/19/2019 8:00 AM Medical Record Number: [id number] Patient Account Number: [id number] Date of Birth/Sex: 07-Jun-1945 (74 y.o. F) Treating RN: Tyler Aas Primary Care Provider: Dala Dock Other Clinician: Referring Provider: Dala Dock Treating Provider/Extender: STONE III, Raymon Schlarb Weeks in Treatment: 0 Verbal / Phone Orders: No Diagnosis Coding ICD-10 Coding Code Description I89.0 Lymphedema, not elsewhere classified I87.2 Venous insufficiency (chronic) (peripheral) E11.622 Type 2 diabetes mellitus with other skin ulcer L97.812 Non-pressure chronic ulcer of other part of right lower leg with fat layer exposed L97.822 Non-pressure chronic ulcer of other part of left lower leg with fat layer exposed Z95.0 Presence of cardiac pacemaker I10 Essential (primary) hypertension I50.42 Chronic combined systolic (congestive) and diastolic (congestive) heart failure Wound Cleansing Wound #1 Right,Distal,Medial Lower Leg o Clean wound with Normal Saline. Wound #2 Left,Distal,Medial Lower Leg o Clean wound with Normal Saline. Primary Wound Dressing Wound #1 Right,Distal,Medial Lower Leg o Silver Alginate Wound #2 Left,Distal,Medial Lower Leg o Silver Alginate Secondary  Dressing Wound #1 Right,Distal,Medial Lower Leg o ABD pad - use abd pad in folds on right and left lower legs prior to unna boot wrap o XtraSorb Wound #2 Left,Distal,Medial Lower Leg o ABD pad - use abd pad in folds on right and left lower legs prior to unna boot wrap o XtraSorb Dressing Change Frequency Wound #1 Right,Distal,Medial Lower Leg o Change Dressing Monday, Wednesday, Friday - HH to change M, W. F unless pt is in the [facility]. Wound #2 Left,Distal,Medial Lower Leg o Change Dressing Monday, Wednesday, Friday - HH to change M, W. F unless pt is in the [facility]. Follow-up Appointments Wound #1 Right,Distal,Medial Lower Leg o Return Appointment in 1 week. o Nurse Visit as needed Wound #2 Left,Distal,Medial Lower Leg o Return Appointment in 1 week. o Nurse Visit as needed Edema Control MICAILA, ZIEMBA ([id number]) Wound #1 Right,Distal,Medial Lower Leg o Unna Boots Bilaterally o Elevate legs to the level of the heart and pump ankles as often as possible Wound #2 Left,Distal,Medial Lower Leg o Unna Boots Bilaterally o Elevate legs to the level of the heart and pump ankles as often as possible Home Health Wound #1 Right,Distal,Medial Lower Leg o Initiate Home Health for Skilled Nursing - M, W, HH to change dressing. On Fridays [facility] to change o Continue Home Health Visits o Home Health Nurse may visit PRN to address patientos wound care needs. o FACE TO FACE ENCOUNTER: MEDICARE and MEDICAID PATIENTS: I certify that this patient is under my care and that I had a face-to-face encounter that meets the physician face-to-face encounter requirements with this patient on this date. The encounter with the patient was in whole or in part for the following MEDICAL CONDITION: (primary reason for Home Healthcare) MEDICAL NECESSITY: I certify, that based on my findings, NURSING services are a medically necessary home health service. HOME BOUND STATUS: I certify that  my clinical findings support that this patient is homebound (i.e., Due to illness or injury, pt requires aid of supportive devices such as crutches, cane, wheelchairs, walkers, the use of special transportation or the assistance of another person to leave their place of residence. There is a normal inability to leave the home and doing so requires considerable and taxing effort.  Other absences are for medical reasons / religious services and are infrequent or of short duration when for other reasons). o If current dressing causes regression in wound condition, may D/C ordered dressing product/s and apply Normal Saline Moist Dressing daily until next Wound Healing Center / Other MD appointment. Notify Wound Healing Center of regression in wound condition at [phone]. o Please direct any NON-WOUND related issues/requests for orders to patient's Primary Care Physician Wound #2 Left,Distal,Medial Lower Leg o Initiate Home Health for Skilled Nursing - M, W, [facility] to change dressing. On Fridays [facility] to change o Continue Home Health Visits o Home Health Nurse may visit PRN to address patientos wound care needs. o FACE TO FACE ENCOUNTER: MEDICARE and MEDICAID PATIENTS: I certify that this patient is under my care and that I had a face-to-face encounter that meets the physician face-to-face encounter requirements with this patient on this date. The encounter with the patient was in whole or in part for the following MEDICAL CONDITION: (primary reason for Home Healthcare) MEDICAL NECESSITY: I certify, that based on my findings, NURSING services are a medically necessary home health service. HOME BOUND STATUS: I certify that my clinical findings support that this patient is homebound (i.e., Due to illness or injury, pt requires aid of supportive devices such as crutches, cane, wheelchairs, walkers, the use of special transportation or the assistance of another person to leave their place of  residence. There is a normal inability to leave the home and doing so requires considerable and taxing effort. Other absences are for medical reasons / religious services and are infrequent or of short duration when for other reasons). o If current dressing causes regression in wound condition, may D/C ordered dressing product/s and apply Normal Saline Moist Dressing daily until next Wound Healing Center / Other MD appointment. Notify Wound Healing Center of regression in wound condition at [phone]. o Please direct any NON-WOUND related issues/requests for orders to patient's Primary Care Physician Services and Therapies o Arterial Studies- Bilateral - Arterial studies with ABI and TBI Electronic Signature(s) Signed: 11/19/2019 4:44:41 PM By: Tyler Aas Signed: 11/21/2019 5:15:44 PM By: Lenda Kelp PA-C Entered By: Tyler Aas on 11/19/2019 10:15:39 Grace Short ([id number]) -------------------------------------------------------------------------------- Problem List Details Patient Name: Grace Short Date of Service: 11/19/2019 8:00 AM Medical Record Number: [id number] Patient Account Number: [id number] Date of Birth/Sex: 28-Oct-1945 (74 y.o. F) Treating RN: Huel Coventry Primary Care Provider: Dala Dock Other Clinician: Referring Provider: Dala Dock Treating Provider/Extender: Linwood Dibbles, Kesleigh Morson Weeks in Treatment: 0 Active Problems ICD-10 Encounter Code Description Active Date MDM Diagnosis I89.0 Lymphedema, not elsewhere classified 11/19/2019 No Yes I87.2 Venous insufficiency (chronic) (peripheral) 11/19/2019 No Yes E11.622 Type 2 diabetes mellitus with other skin ulcer 11/19/2019 No Yes L97.812 Non-pressure chronic ulcer of other part of right lower leg with fat layer 11/19/2019 No Yes exposed L97.822 Non-pressure chronic ulcer of other part of left lower leg with fat layer 11/19/2019 No Yes exposed Z95.0 Presence of cardiac pacemaker 11/19/2019 No  Yes I10 Essential (primary) hypertension 11/19/2019 No Yes I50.42 Chronic combined systolic (congestive) and diastolic (congestive) heart 11/19/2019 No Yes failure Inactive Problems Resolved Problems Electronic Signature(s) Signed: 11/19/2019 9:49:52 AM By: Lenda Kelp PA-C Previous Signature: 11/19/2019 9:26:49 AM Version By: Lenda Kelp PA-C Entered By: Lenda Kelp on 11/19/2019 09:49:51 Grace Short ([id number]) -------------------------------------------------------------------------------- Progress Note Details Patient Name: Grace Short Date of Service: 11/19/2019 8:00 AM Medical Record Number: [id number] Patient Account Number: [id number] Date of  Birth/Sex: [birth year] (74 y.o. F) Treating RN: Tyler Aas Primary Care Provider: Dala Dock Other Clinician: Referring Provider: Dala Dock Treating Provider/Extender: Linwood Dibbles, Dicie Edelen Weeks in Treatment: 0 Subjective Chief Complaint Information obtained from Patient Bilateral LE Ulcers History of Present Illness (HPI) 11/19/19 upon evaluation today patient appears to be present for initial inspection here clinic due to bilateral lower extremity lymphedema where she has open areas noted on the medial aspects of her lower legs bilaterally. Fortunately there is no signs of active infection at this time. She's been tolerating the dressing changes without complication. With that being said I do feel like that the patient is somewhat uncontrolled in regard to her lymphedema I think there are a couple options we could consider in this regard will discuss those greater in the plan. She does have a history again of lymphedema, chronic venous insufficiency, diabetes mellitus type II, she has a cardiac pacemaker, hypertension, and chronic congestive heart failure. Currently the patient has not had any compression of any kind she fairly recently over the past couple of years move from up [place]. She has not been taught  lymphedema clinic up to this point. Patient History Information obtained from Patient. Allergies Ativan (Reaction: too sedating) Family History Diabetes - Father, Heart Disease - Father, Hypertension - Father, No family history of Cancer, Hereditary Spherocytosis, Kidney Disease, Lung Disease, Seizures, Stroke, Thyroid Problems, Tuberculosis. Social History Never smoker, Marital Status - Single, Alcohol Use - Never, Drug Use - No History, Caffeine Use - Daily. Medical History Eyes Patient has history of Glaucoma Denies history of Cataracts, Optic Neuritis Cardiovascular Patient has history of Congestive Heart Failure, Hypertension Denies history of Angina, Arrhythmia, Coronary Artery Disease, Deep Vein Thrombosis, Hypotension, Myocardial Infarction, Peripheral Arterial Disease, Peripheral Venous Disease, Phlebitis, Vasculitis Gastrointestinal Denies history of Cirrhosis , Colitis, Crohn s, Hepatitis A, Hepatitis B, Hepatitis C Endocrine Patient has history of Type I Diabetes Immunological Denies history of Lupus Erythematosus, Raynaud s, Scleroderma Integumentary (Skin) Denies history of History of Burn, History of pressure wounds Neurologic 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 Blood sugar is tested. Blood sugar results noted at the following times: Breakfast - 131, Lunch - 123, Dinner - 180. Medical And Surgical History Notes Cardiovascular Pacemaker since 02/2014. Had a CVA in 2017 Review of Systems (ROS) Constitutional Symptoms (General Health) Denies complaints or symptoms of Fatigue, Fever, Chills, Marked Weight Change. Eyes Complains or has symptoms of Vision Changes, Glasses / Contacts - wears glasses to read. Denies complaints or symptoms of Dry  Eyes. Ear/Nose/Mouth/Throat Denies complaints or symptoms of Difficult clearing ears, Sinusitis. Respiratory Lippens, Rashad V. ([id number]) Denies complaints or symptoms of Chronic or frequent coughs, Shortness of Breath. Cardiovascular Complains or has symptoms of LE edema. Denies complaints or symptoms of Chest pain. Gastrointestinal Denies complaints or symptoms of Frequent diarrhea, Nausea, Vomiting, Colorectal surgery 03/2014 Endocrine Denies complaints or symptoms of Hepatitis, Thyroid disease, Polydypsia (Excessive Thirst). Genitourinary Denies complaints or symptoms of Kidney failure/ Dialysis, Incontinence/dribbling. Immunological Denies complaints or symptoms of Hives, Itching. Integumentary (Skin) Complains or has symptoms of Wounds - lymphadema ulcers on bilateral lower legs, Breakdown, Swelling. Denies complaints or symptoms of Bleeding or bruising tendency. Musculoskeletal Denies complaints or symptoms of Muscle Pain, Muscle Weakness. Neurologic Denies complaints or symptoms of Numbness/parasthesias, Focal/Weakness. Oncologic Had colorectal cancer in 2016. No chemo or radiation Psychiatric Denies  complaints or symptoms of Anxiety, Claustrophobia. Objective Constitutional patient is hypertensive.. pulse regular and within target range for patient.Marland Kitchen respirations regular, non-labored and within target range for patient.Marland Kitchen temperature within target range for patient.. Well-nourished and well-hydrated in no acute distress. Vitals Time Taken: 8:35 AM, Height: 62 in, Source: Stated, Weight: 300 lbs, Source: Stated, BMI: 54.9, Temperature: 98.3 F, Pulse: 73 bpm, Respiratory Rate: 20 breaths/min, Blood Pressure: 205/83 mmHg. Eyes conjunctiva clear no eyelid edema noted. pupils equal round and reactive to light and accommodation. Ears, Nose, Mouth, and Throat no gross abnormality of ear auricles or external auditory canals. normal hearing noted during conversation. mucus  membranes moist. Respiratory normal breathing without difficulty. Cardiovascular Absent posterior tibial and dorsalis pedis pulses bilateral lower extremities The pulses were difficult to obtain due to the fact that the patient is so swollen.. Patient has bilateral lower extremity stage III lymphedema. Musculoskeletal Patient unable to walk without assistance. no significant deformity or arthritic changes, no loss or range of motion, no clubbing. Psychiatric this patient is able to make decisions and demonstrates good insight into disease process. Alert and Oriented x 3. pleasant and cooperative. General Notes: Upon inspection patient had open areas on the insides of both lower extremities at this point which is secondary to the chronic lymphedema. I do believe that her swelling is a little bit worse more recently which is probably why the area started opening and draining. Nonetheless I think this does need to be addressed as soon as possible to try to keep the edema down and the wounds healed. Integumentary (Hair, Skin) Wound #1 status is Open. Original cause of wound was Gradually Appeared. The wound is located on the Right,Distal,Medial Lower Leg. The wound measures 5.5cm length x 10cm width x 0.1cm depth; 43.197cm^2 area and 4.32cm^3 volume. There is Fat Layer (Subcutaneous Tissue) exposed. There is no tunneling or undermining noted. There is a large amount of serous drainage noted. Foul odor after cleansing was noted. The wound margin is distinct with the outline attached to the wound base. There is small (1-33%) pink granulation within the wound bed. There is a large (67-100%) amount of necrotic tissue within the wound bed including Adherent Slough. Wound #2 status is Open. Original cause of wound was Gradually Appeared. The wound is located on the Left,Distal,Medial Lower Leg. The wound measures 6cm length x 6cm width x 0.1cm depth; 28.274cm^2 area and 2.827cm^3 volume. There is Fat Layer  (Subcutaneous Tissue) exposed. There is no tunneling or undermining noted. There is a large amount of serous drainage noted. Foul odor after cleansing was noted. The wound margin is distinct with the outline attached to the wound base. There is small (1-33%) pink granulation within the wound bed. There is a large Grace Short, Grace V. [phone]) (67-100%) amount of necrotic tissue within the wound bed including Adherent Slough. Assessment Active Problems ICD-10 Lymphedema, not elsewhere classified Venous insufficiency (chronic) (peripheral) Type 2 diabetes mellitus with other skin ulcer Non-pressure chronic ulcer of other part of right lower leg with fat layer exposed Non-pressure chronic ulcer of other part of left lower leg with fat layer exposed Presence of cardiac pacemaker Essential (primary) hypertension Chronic combined systolic (congestive) and diastolic (congestive) heart failure Procedures Wound #1 Pre-procedure diagnosis of Wound #1 is a Lymphedema located on the Right,Distal,Medial Lower Leg . There was a Radio broadcast assistant Compression Therapy Procedure by Tyler Aas, RN. Post procedure Diagnosis Wound #1: Same as Pre-Procedure Wound #2 Pre-procedure diagnosis of Wound #2 is a Lymphedema located on  the Left,Distal,Medial Lower Leg . There was a Radio broadcast assistant Compression Therapy Procedure by Tyler Aas, RN. Post procedure Diagnosis Wound #2: Same as Pre-Procedure Plan Wound Cleansing: Wound #1 Right,Distal,Medial Lower Leg: Clean wound with Normal Saline. Wound #2 Left,Distal,Medial Lower Leg: Clean wound with Normal Saline. Primary Wound Dressing: Wound #1 Right,Distal,Medial Lower Leg: Silver Alginate Wound #2 Left,Distal,Medial Lower Leg: Silver Alginate Secondary Dressing: Wound #1 Right,Distal,Medial Lower Leg: ABD pad - use abd pad in folds on right and left lower legs prior to unna boot wrap XtraSorb Wound #2 Left,Distal,Medial Lower Leg: ABD pad - use abd pad in  folds on right and left lower legs prior to unna boot wrap XtraSorb Dressing Change Frequency: Wound #1 Right,Distal,Medial Lower Leg: Change Dressing Monday, Wednesday, Friday - HH to change M, W. F unless pt is in the [facility]. Wound #2 Left,Distal,Medial Lower Leg: Change Dressing Monday, Wednesday, Friday - HH to change M, W. F unless pt is in the [facility]. Follow-up Appointments: Wound #1 Right,Distal,Medial Lower Leg: Return Appointment in 1 week. Nurse Visit as needed Wound #2 Left,Distal,Medial Lower Leg: Return Appointment in 1 week. Nurse Visit as needed Edema Control: Wound #1 Right,Distal,Medial Lower Leg: Grace Short, Grace Short ([id number]) Unna Boots Bilaterally Elevate legs to the level of the heart and pump ankles as often as possible Wound #2 Left,Distal,Medial Lower Leg: Unna Boots Bilaterally Elevate legs to the level of the heart and pump ankles as often as possible Home Health: Wound #1 Right,Distal,Medial Lower Leg: Initiate Home Health for Skilled Nursing - M, W, [facility] to change dressing. On Fridays [facility] to change Continue Home Health Visits Home Health Nurse may visit PRN to address patient s wound care needs. FACE TO FACE ENCOUNTER: MEDICARE and MEDICAID PATIENTS: I certify that this patient is under my care and that I had a face-to-face encounter that meets the physician face-to-face encounter requirements with this patient on this date. The encounter with the patient was in whole or in part for the following MEDICAL CONDITION: (primary reason for Home Healthcare) MEDICAL NECESSITY: I certify, that based on my findings, NURSING services are a medically necessary home health service. HOME BOUND STATUS: I certify that my clinical findings support that this patient is homebound (i.e., Due to illness or injury, pt requires aid of supportive devices such as crutches, cane, wheelchairs, walkers, the use of special transportation or the assistance of another person to leave their place of  residence. There is a normal inability to leave the home and doing so requires considerable and taxing effort. Other absences are for medical reasons / religious services and are infrequent or of short duration when for other reasons). If current dressing causes regression in wound condition, may D/C ordered dressing product/s and apply Normal Saline Moist Dressing daily until next Wound Healing Center / Other MD appointment. Notify Wound Healing Center of regression in wound condition at [phone]. Please direct any NON-WOUND related issues/requests for orders to patient's Primary Care Physician Wound #2 Left,Distal,Medial Lower Leg: Initiate Home Health for Skilled Nursing - M, W, [facility] to change dressing. On Fridays [place] [facility] to change Continue Home Health Visits Home Health Nurse may visit PRN to address patient s wound care needs. FACE TO FACE ENCOUNTER: MEDICARE and MEDICAID PATIENTS: I certify that this patient is under my care and that I had a face-to-face encounter that meets the physician face-to-face encounter requirements with this patient on this date. The encounter with the patient was in whole or in part for the following MEDICAL  CONDITION: (primary reason for Home Healthcare) MEDICAL NECESSITY: I certify, that based on my findings, NURSING services are a medically necessary home health service. HOME BOUND STATUS: I certify that my clinical findings support that this patient is homebound (i.e., Due to illness or injury, pt requires aid of supportive devices such as crutches, cane, wheelchairs, walkers, the use of special transportation or the assistance of another person to leave their place of residence. There is a normal inability to leave the home and doing so requires considerable and taxing effort. Other absences are for medical reasons / religious services and are infrequent or of short duration when for other reasons). If current dressing causes regression in wound condition, may  D/C ordered dressing product/s and apply Normal Saline Moist Dressing daily until next Wound Healing Center / Other MD appointment. Notify Wound Healing Center of regression in wound condition at [phone]. Please direct any NON-WOUND related issues/requests for orders to patient's Primary Care Physician Services and Therapies ordered were: Arterial Studies- Bilateral - Arterial studies with ABI and TBI 1. At this point I'm gonna suggest currently that we go ahead and initiate treatment with a silver alginate dressing followed by Anola GurneyXtrasorb which I think will do well as far as helping the patient to heal general. 2. I'm also gonna recommend that we go ahead and initiate treatment with a little bit route to the bilateral lower extremities which I think will help her skin as well as helping to get some of the edema down. Patients in agreement with that plan. 3. With regard to the elevation the patient does need to try to elevate her legs as much as possible to keep edema in the control. She also needs to try and keep her feet off the floor and elevated as much as possible obscene the more of that she can do the better off you will likely be in the long run as far as her edema is concerned. 4. I also think the patient may benefit from lifting the pump she actually is been receiving vascular shortly but a lot of times she does need ongoing compression for several weeks prior to the pumps being ordered and approved by insurance. 5. With regard to the referral to the lymphedema clinic I do believe this could be beneficial for the patient as well. That was discussed with the patient and her daughter during the office visit today although we do need to get her feel before we can make that referral. Please see above for specific wound care orders. We will see patient for re-evaluation in 1 week(s) here in the clinic. If anything worsens or changes patient will contact our office for additional  recommendations. Electronic Signature(s) Signed: 11/30/2019 9:48:54 AM By: Lenda KelpStone III, Braxtyn Dorff PA-C Entered By: Lenda KelpStone III, Lovetta Condie on 11/30/2019 09:38:17 Grace Short, Grace V. ([id number]) -------------------------------------------------------------------------------- ROS/PFSH Details Patient Name: Grace Short, Grace V. Date of Service: 11/19/2019 8:00 AM Medical Record Number: [id number] Patient Account Number: [id number] Date of Birth/Sex: 1945/04/17 (74 y.o. F) Treating RN: Huel CoventryWoody, Kim Primary Care Provider: Dala DockMARKLY, LINDA Other Clinician: Referring Provider: Dala DockMARKLY, LINDA Treating Provider/Extender: STONE III, Hridaan Bouse Weeks in Treatment: 0 Information Obtained From Patient Constitutional Symptoms (General Health) Complaints and Symptoms: Negative for: Fatigue; Fever; Chills; Marked Weight Change Eyes Complaints and Symptoms: Positive for: Vision Changes; Glasses / Contacts - wears glasses to read Negative for: Dry Eyes Medical History: Positive for: Glaucoma Negative for: Cataracts; Optic Neuritis Ear/Nose/Mouth/Throat Complaints and Symptoms: Negative for: Difficult clearing ears; Sinusitis Respiratory Complaints and Symptoms:  Negative for: Chronic or frequent coughs; Shortness of Breath Cardiovascular Complaints and Symptoms: Positive for: LE edema Negative for: Chest pain Medical History: Positive for: Congestive Heart Failure; Hypertension Negative for: Angina; Arrhythmia; Coronary Artery Disease; Deep Vein Thrombosis; Hypotension; Myocardial Infarction; Peripheral Arterial Disease; Peripheral Venous Disease; Phlebitis; Vasculitis Past Medical History Notes: Pacemaker since 02/2014. Had a CVA in 2017 Gastrointestinal Complaints and Symptoms: Negative for: Frequent diarrhea; Nausea; Vomiting Review of System Notes: Colorectal surgery 03/2014 Medical History: Negative for: Cirrhosis ; Colitis; Crohnos; Hepatitis A; Hepatitis B; Hepatitis C Endocrine Complaints and Symptoms: Negative for:  Hepatitis; Thyroid disease; Polydypsia (Excessive Thirst) Medical History: Positive for: Type I Diabetes Time with diabetes: over 20 years Treated with: Insulin Blood sugar tested every day: Yes Tested Grace Short, VARGUS. 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; Breakdown; Swelling Negative for: Bleeding or bruising tendency Medical History: Negative for: History of Burn; History of pressure wounds Musculoskeletal Complaints and Symptoms: Negative for: Muscle Pain; Muscle Weakness Neurologic Complaints and Symptoms: Negative for: Numbness/parasthesias; Focal/Weakness Medical History: 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 No chemo or radiation Medical History: Negative for: Received Chemotherapy; Received Radiation HBO Extended History Items Eyes: Glaucoma Immunizations Pneumococcal Vaccine: Received Pneumococcal Vaccination: Yes Implantable Devices Yes Family and Social History Cancer: No; Diabetes: Yes - Father; Heart Disease: Yes - Father; Hereditary Spherocytosis: No; Hypertension: Yes - Father; Kidney Disease: No; Lung Disease: No; Seizures: No; Stroke: No; Thyroid Problems: No; Tuberculosis: No; Never smoker; Marital Status - Single; Alcohol Use: Grace Short, Grace Short ([id number]) Never; Drug Use: No History; Caffeine Use: Daily;  Financial Concerns: No; Food, Clothing or Shelter Needs: No; Support System Lacking: No; Transportation Concerns: No Electronic Signature(s) Signed: 11/19/2019 11:49:13 AM By: Benna Dunks Signed: 11/19/2019 4:28:55 PM By: Elliot Gurney BSN, RN, CWS, Kim RN, BSN Signed: 11/21/2019 5:15:44 PM By: Lenda Kelp PA-C Entered By: Benna Dunks on 11/19/2019 09:29:04 Grace Short ([id number]) -------------------------------------------------------------------------------- SuperBill Details Patient Name: Grace Short Date of Service: 11/19/2019 Medical Record Number: [id number] Patient Account Number: [id number] Date of Birth/Sex: [birth year] (74 y.o. 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.822 Non-pressure chronic ulcer of other part of left lower leg with fat layer exposed Z95.0 Presence of cardiac pacemaker I10 Essential (primary) hypertension I50.42 Chronic combined systolic (congestive) and diastolic (congestive) heart failure Facility Procedures CPT4 Code: [id number] Description: 99213 - WOUND CARE VISIT-LEV 3 EST PT Modifier: Quantity: 1 Physician Procedures CPT4 Code: 3244010 Description: WC PHYS LEVEL 3 o NEW PT Modifier: Quantity: 1 CPT4 Code: Description: ICD-10 Diagnosis Description I89.0 Lymphedema, not elsewhere classified I87.2 Venous insufficiency (chronic) (peripheral) E11.622 Type 2 diabetes mellitus with other skin ulcer L97.812 Non-pressure chronic ulcer of other part of right lower  leg with  fat Modifier: layer  exposed Quantity: Electronic Signature(s) Signed: 11/19/2019 5:48:46 PM By: Lenda Kelp PA-C Entered By: Lenda Kelp on 11/19/2019 17:48:46

## 2019-11-26 ENCOUNTER — Ambulatory Visit: Payer: 59 | Admitting: Physician Assistant

## 2019-11-29 ENCOUNTER — Other Ambulatory Visit: Payer: Self-pay

## 2019-11-29 ENCOUNTER — Encounter: Payer: Medicare HMO | Admitting: Physician Assistant

## 2019-11-29 DIAGNOSIS — I890 Lymphedema, not elsewhere classified: Secondary | ICD-10-CM | POA: Diagnosis not present

## 2019-11-29 NOTE — Progress Notes (Addendum)
Grace Short, Grace Short ([id number]) Visit Report for 11/29/2019 Chief Complaint Document Details Patient Name: Grace Short Date of Service: 11/29/2019 10:45 AM Medical Record Number: [id number] Patient Account Number: [id number] Date of Birth/Sex: 02-Mar-1946 (74 y.o. F) Treating RN: Grace Short Primary Care Provider: Dala Short Other Clinician: Referring Provider: Dala Short Treating Provider/Extender: Grace Short, Grace Short in Treatment: 1 Information Obtained from: Patient Chief Complaint Bilateral LE Ulcers Electronic Signature(s) Signed: 11/29/2019 10:53:00 AM By: Grace Kelp PA-C Entered By: Grace Short on 11/29/2019 10:53:00 Grace Short ([id number]) -------------------------------------------------------------------------------- HPI Details Patient Name: Grace Short Date of Service: 11/29/2019 10:45 AM Medical Record Number: [id number] Patient Account Number: [id number] Date of Birth/Sex: May 19, 1945 (74 y.o. F) Treating RN: Grace Short Primary Care Provider: Dala Short Other Clinician: Referring Provider: Dala Short Treating Provider/Extender: Grace Short, Grace Short Short in Treatment: 1 History of Present Illness HPI Description: 11/19/19 upon evaluation today patient appears to be present for initial inspection here clinic due to bilateral lower extremity lymphedema where she has open areas noted on the medial aspects of her lower legs bilaterally. Fortunately there is no signs of active infection at this time. She's been tolerating the dressing changes without complication. With that being said I do feel like that the patient is somewhat uncontrolled in regard to her lymphedema I think there are a couple options we could consider in this regard will discuss those greater in the plan. She does have a history again of lymphedema, chronic venous insufficiency, diabetes mellitus type II, she has a cardiac pacemaker, hypertension, and chronic congestive heart  failure. Currently the patient has not had any compression of any kind she fairly recently over the past couple of years move from up [place]. She has not been taught lymphedema clinic up to this point. 11/29/19 I evaluation today patient appears to be doing well with regard to her wounds. The left side appears completely healed the right side I think is likely healed but I cannot be completely sure there is no drainage still occurring here. Fortunately there's no signs of active infection which is great news. No fevers, chills, nausea, or vomiting noted at this time. Electronic Signature(s) Signed: 11/30/2019 9:48:54 AM By: Grace Kelp PA-C Entered By: Grace Short on 11/30/2019 09:43:35 Grace Short ([id number]) -------------------------------------------------------------------------------- Physical Exam Details Patient Name: Grace Short Date of Service: 11/29/2019 10:45 AM Medical Record Number: [id number] Patient Account Number: [id number] Date of Birth/Sex: 1945-11-22 (74 y.o. F) Treating RN: Grace Short Primary Care Provider: Dala Short Other Clinician: Referring Provider: Dala Short Treating Provider/Extender: Grace Short Short in Treatment: 1 Constitutional Obese and well-hydrated in no acute distress. Respiratory normal breathing without difficulty. Psychiatric this patient is able to make decisions and demonstrates good insight into disease process. Alert and Oriented x 3. pleasant and cooperative. Notes Upon inspection patients when bad actually showed signs of good regulation at this time there does not appear to be evidence of active infection which is also great news and in general I feel like the patient is managing quite nicely with regard to her legs. [employer] seem to be helping though I am going to wrap her legs for one more time I will also see about making referral to lymphedema clinic. Right now again there does not appear to be  anything open which is great news I'm just more wrapping out of caution especially on the right to make sure nothing reopens. Electronic Signature(s) Signed: 11/30/2019 9:48:54 AM By: Grace Bras  III, Keyri Salberg PA-C Entered By: Grace Short on 11/30/2019 09:45:57 Grace Short ([id number]) -------------------------------------------------------------------------------- Physician Orders Details Patient Name: Grace Short Date of Service: 11/29/2019 10:45 AM Medical Record Number: [id number] Patient Account Number: [id number] Date of Birth/Sex: 1945/11/15 (74 y.o. F) Treating RN: Grace Short Primary Care Provider: Dala Short Other Clinician: Referring Provider: Dala Short Treating Provider/Extender: Grace Short, Tiahna Cure Short in Treatment: 1 Verbal / Phone Orders: No Diagnosis Coding ICD-10 Coding Code Description I89.0 Lymphedema, not elsewhere classified I87.2 Venous insufficiency (chronic) (peripheral) E11.622 Type 2 diabetes mellitus with other skin ulcer L97.812 Non-pressure chronic ulcer of other part of right lower leg with fat layer exposed L97.822 Non-pressure chronic ulcer of other part of left lower leg with fat layer exposed Z95.0 Presence of cardiac pacemaker I10 Essential (primary) hypertension I50.42 Chronic combined systolic (congestive) and diastolic (congestive) heart failure Wound Cleansing Wound #1 Right,Distal,Medial Lower Leg o Clean wound with Normal Saline. Primary Wound Dressing Wound #1 Right,Distal,Medial Lower Leg o Silver Alginate Secondary Dressing Wound #1 Right,Distal,Medial Lower Leg o ABD pad - use abd pad in folds on right and left lower legs prior to unna boot wrap Dressing Change Frequency Wound #1 Right,Distal,Medial Lower Leg o Change Dressing Monday, Wednesday, Friday - HH to change M, W. F unless pt is in the [facility]. Follow-up Appointments Wound #1 Right,Distal,Medial Lower Leg o Return Appointment in 2 Short. o Nurse Visit as  needed Edema Control Wound #1 Right,Distal,Medial Lower Leg o Unna Boots Bilaterally o Elevate legs to the level of the heart and pump ankles as often as possible Home Health Wound #1 Right,Distal,Medial Lower Leg o Initiate Home Health for Skilled Nursing - M, W, HH to change dressing. Every other week on Fridays [facility] to change o Continue Home Health Visits o Home Health Nurse may visit PRN to address patientos wound care needs. o FACE TO FACE ENCOUNTER: MEDICARE and MEDICAID PATIENTS: I certify that this patient is under my care and that I had a face-to-face encounter that meets the physician face-to-face encounter requirements with this patient on this date. The encounter with the patient was in whole or in part for the following MEDICAL CONDITION: (primary reason for Home Healthcare) MEDICAL NECESSITY: I certify, that based on my findings, NURSING services are a medically necessary home health service. HOME BOUND STATUS: I certify that my clinical findings support that this patient is homebound (i.e., Due to illness or injury, pt requires aid of supportive devices such as crutches, cane, wheelchairs, walkers, the use of special transportation or the assistance of another person to leave their place of residence. There is a normal inability to leave the Grace Short, Grace Short. ([id number]) home and doing so requires considerable and taxing effort. Other absences are for medical reasons / religious services and are infrequent or of short duration when for other reasons). o If current dressing causes regression in wound condition, may D/C ordered dressing product/s and apply Normal Saline Moist Dressing daily until next Wound Healing Center / Other MD appointment. Notify Wound Healing Center of regression in wound condition at [phone]. o Please direct any NON-WOUND related issues/requests for orders to patient's Primary Care Physician Services and Therapies o Lymphedema Clinic  - bilateral lower ext lymphadema Electronic Signature(s) Signed: 11/29/2019 4:17:03 PM By: Grace Short Signed: 11/30/2019 9:48:54 AM By: Grace Kelp PA-C Entered By: Grace Short on 11/29/2019 11:40:33 Grace Short ([id number]) -------------------------------------------------------------------------------- Problem List Details Patient Name: Grace Short Date of Service: 11/29/2019 10:45 AM Medical Record  Number: [id number] Patient Account Number: [id number] Date of Birth/Sex: 11/11/1945 (75 y.o. F) Treating RN: Grace Short Primary Care Provider: Dala Short Other Clinician: Referring Provider: Dala Short Treating Provider/Extender: Grace Short, Jonny Dearden Short in Treatment: 1 Active Problems ICD-10 Encounter Code Description Active Date MDM Diagnosis I89.0 Lymphedema, not elsewhere classified 11/19/2019 No Yes I87.2 Venous insufficiency (chronic) (peripheral) 11/19/2019 No Yes E11.622 Type 2 diabetes mellitus with other skin ulcer 11/19/2019 No Yes L97.812 Non-pressure chronic ulcer of other part of right lower leg with fat layer 11/19/2019 No Yes exposed L97.822 Non-pressure chronic ulcer of other part of left lower leg with fat layer 11/19/2019 No Yes exposed Z95.0 Presence of cardiac pacemaker 11/19/2019 No Yes I10 Essential (primary) hypertension 11/19/2019 No Yes I50.42 Chronic combined systolic (congestive) and diastolic (congestive) heart 11/19/2019 No Yes failure Inactive Problems Resolved Problems Electronic Signature(s) Signed: 11/29/2019 10:52:50 AM By: Grace Kelp PA-C Entered By: Grace Short on 11/29/2019 10:52:49 Grace Short ([id number]) -------------------------------------------------------------------------------- Progress Note Details Patient Name: Grace Short Date of Service: 11/29/2019 10:45 AM Medical Record Number: [id number] Patient Account Number: [id number] Date of Birth/Sex: [birth year] (74 y.o. F) Treating RN: Grace Short Primary Care Provider: Dala Short Other Clinician: Referring Provider: Dala Short Treating Provider/Extender: Grace Short, Alayah Knouff Short in Treatment: 1 Subjective Chief Complaint Information obtained from Patient Bilateral LE Ulcers History of Present Illness (HPI) 11/19/19 upon evaluation today patient appears to be present for initial inspection here clinic due to bilateral lower extremity lymphedema where she has open areas noted on the medial aspects of her lower legs bilaterally. Fortunately there is no signs of active infection at this time. She's been tolerating the dressing changes without complication. With that being said I do feel like that the patient is somewhat uncontrolled in regard to her lymphedema I think there are a couple options we could consider in this regard will discuss those greater in the plan. She does have a history again of lymphedema, chronic venous insufficiency, diabetes mellitus type II, she has a cardiac pacemaker, hypertension, and chronic congestive heart failure. Currently the patient has not had any compression of any kind she fairly recently over the past couple of years move from up [place]. She has not been taught lymphedema clinic up to this point. 11/29/19 I evaluation today patient appears to be doing well with regard to her wounds. The left side appears completely healed the right side I think is likely healed but I cannot be completely sure there is no drainage still occurring here. Fortunately there's no signs of active infection which is great news. No fevers, chills, nausea, or vomiting noted at this time. Objective Constitutional Obese and well-hydrated in no acute distress. Vitals Time Taken: 11:00 AM, Height: 62 in, Weight: 300 lbs, BMI: 54.9, Temperature: 98.2 F, Pulse: 69 bpm, Respiratory Rate: 20 breaths/min, Blood Pressure: 197/70 mmHg. Respiratory normal breathing without difficulty. Psychiatric this patient is able to  make decisions and demonstrates good insight into disease process. Alert and Oriented x 3. pleasant and cooperative. General Notes: Upon inspection patients when bad actually showed signs of good regulation at this time there does not appear to be evidence of active infection which is also great news and in general I feel like the patient is managing quite nicely with regard to her legs. [employer] seem to be helping though I am going to wrap her legs for one more time I will also see about making referral to lymphedema clinic. Right now again there does  not appear to be anything open which is great news I'm just more wrapping out of caution especially on the right to make sure nothing reopens. Integumentary (Hair, Skin) Wound #1 status is Open. Original cause of wound was Gradually Appeared. The wound is located on the Right,Distal,Medial Lower Leg. The wound measures 0.1cm length x 0.1cm width x 0.1cm depth; 0.008cm^2 area and 0.001cm^3 volume. There is Fat Layer (Subcutaneous Tissue) exposed. There is a large amount of serous drainage noted. Foul odor after cleansing was noted. The wound margin is distinct with the outline attached to the wound base. There is small (1-33%) pink granulation within the wound bed. There is a large (67-100%) amount of necrotic tissue within the wound bed including Adherent Slough. Wound #2 status is Healed - Epithelialized. Original cause of wound was Gradually Appeared. The wound is located on the Left,Distal,Medial Lower Leg. The wound measures 0cm length x 0cm width x 0cm depth; 0cm^2 area and 0cm^3 volume. There is Fat Layer (Subcutaneous Tissue) exposed. There is a large amount of serous drainage noted. Foul odor after cleansing was noted. The wound margin is distinct with the outline attached to the wound base. There is small (1-33%) pink granulation within the wound bed. There is a large (67-100%) amount of necrotic tissue within the wound bed including  Adherent Slough. Grace Short, Grace Short ([id number]) Assessment Active Problems ICD-10 Lymphedema, not elsewhere classified Venous insufficiency (chronic) (peripheral) Type 2 diabetes mellitus with other skin ulcer Non-pressure chronic ulcer of other part of right lower leg with fat layer exposed Non-pressure chronic ulcer of other part of left lower leg with fat layer exposed Presence of cardiac pacemaker Essential (primary) hypertension Chronic combined systolic (congestive) and diastolic (congestive) heart failure Procedures Wound #1 Pre-procedure diagnosis of Wound #1 is a Lymphedema located on the Right,Distal,Medial Lower Leg . There was a Radio broadcast assistant Compression Therapy Procedure by Grace Aas, RN. Post procedure Diagnosis Wound #1: Same as Pre-Procedure Plan Wound Cleansing: Wound #1 Right,Distal,Medial Lower Leg: Clean wound with Normal Saline. Primary Wound Dressing: Wound #1 Right,Distal,Medial Lower Leg: Silver Alginate Secondary Dressing: Wound #1 Right,Distal,Medial Lower Leg: ABD pad - use abd pad in folds on right and left lower legs prior to unna boot wrap Dressing Change Frequency: Wound #1 Right,Distal,Medial Lower Leg: Change Dressing Monday, Wednesday, Friday - HH to change M, W. F unless pt is in the [facility]. Follow-up Appointments: Wound #1 Right,Distal,Medial Lower Leg: Return Appointment in 2 Short. Nurse Visit as needed Edema Control: Wound #1 Right,Distal,Medial Lower Leg: Unna Boots Bilaterally Elevate legs to the level of the heart and pump ankles as often as possible Home Health: Wound #1 Right,Distal,Medial Lower Leg: Initiate Home Health for Skilled Nursing - M, W, [facility] to change dressing. Every other week on Fridays [facility] to change Continue Home Health Visits Home Health Nurse may visit PRN to address patient s wound care needs. FACE TO FACE ENCOUNTER: MEDICARE and MEDICAID PATIENTS: I certify that this patient is under my care and that I had a  face-to-face encounter that meets the physician face-to-face encounter requirements with this patient on this date. The encounter with the patient was in whole or in part for the following MEDICAL CONDITION: (primary reason for Home Healthcare) MEDICAL NECESSITY: I certify, that based on my findings, NURSING services are a medically necessary home health service. HOME BOUND STATUS: I certify that my clinical findings support that this patient is homebound (i.e., Due to illness or injury, pt requires aid of supportive devices such  as crutches, cane, wheelchairs, walkers, the use of special transportation or the assistance of another person to leave their place of residence. There is a normal inability to leave the home and doing so requires considerable and taxing effort. Other absences are for medical reasons / religious services and are infrequent or of short duration when for other reasons). If current dressing causes regression in wound condition, may D/C ordered dressing product/s and apply Normal Saline Moist Dressing daily until next Wound Healing Center / Other MD appointment. Notify Wound Healing Center of regression in wound condition at [phone]. Please direct any NON-WOUND related issues/requests for orders to patient's Primary Care Physician Services and Therapies ordered were: Lymphedema Clinic - bilateral lower ext lymphadema Grace Short, Grace V. ([id number]) I do recommend at this point that we go ahead and continue with the silver alginate dressing to the right leg. The leopard is good used in a beating Pat. 2. I'm also going to suggest that this time that we continue with the compression wraps I do feel like the wood wraps are doing a good job for the patient. 3. I will go ahead and make referral damaging the clinic since the patient doesn't appear to have anything significant open or draining at this point. I think she's demanding ongoing care and some type of wraps to be used at  home. We will get this scheduled for her as soon as we can. Please see above for specific wound care orders. We will see patient for re-evaluation in 2 week(s) here in the clinic. If anything worsens or changes patient will contact our office for additional recommendations. Electronic Signature(s) Signed: 11/30/2019 9:48:54 AM By: Grace KelpStone III, Mattix Imhof PA-C Entered By: Grace KelpStone III, Diedre Maclellan on 11/30/2019 09:47:42 Grace Short, Grace V. ([id number]) -------------------------------------------------------------------------------- SuperBill Details Patient Name: Grace Short, Grace V. Date of Service: 11/29/2019 Medical Record Number: [id number] Patient Account Number: [id number] Date of Birth/Sex: [birth year] (74 y.o. F) Treating RN: Grace AasButler, Michelle Primary Care Provider: Dala DockMARKLY, LINDA Other Clinician: Referring Provider: Dala DockMARKLY, LINDA Treating Provider/Extender: Grace DibblesSTONE III, Saphia Vanderford Short in Treatment: 1 Diagnosis Coding ICD-10 Codes Code Description I89.0 Lymphedema, not elsewhere classified I87.2 Venous insufficiency (chronic) (peripheral) E11.622 Type 2 diabetes mellitus with other skin ulcer L97.812 Non-pressure chronic ulcer of other part of right lower leg with fat layer exposed L97.822 Non-pressure chronic ulcer of other part of left lower leg with fat layer exposed Z95.0 Presence of cardiac pacemaker I10 Essential (primary) hypertension I50.42 Chronic combined systolic (congestive) and diastolic (congestive) heart failure Facility Procedures CPT4: Description Modifier Quantity Code [id number] BILATERAL: Application of multi-layer venous compression system; leg (below knee), including 1 ankle and foot. Physician Procedures CPT4 Code: [id number] Description: 99214 - WC PHYS LEVEL 4 - EST PT Modifier: Quantity: 1 CPT4 Code: Description: ICD-10 Diagnosis Description I89.0 Lymphedema, not elsewhere classified I87.2 Venous insufficiency (chronic) (peripheral) E11.622 Type 2 diabetes mellitus with other skin ulcer  L97.812 Non-pressure chronic ulcer of other part of right lower  leg with fat la Modifier: yer exposed Quantity: Electronic Signature(s) Signed: 11/30/2019 9:48:54 AM By: Grace KelpStone III, Iven Earnhart PA-C Previous Signature: 11/29/2019 4:17:03 PM Version By: Grace AasButler, Michelle Entered By: Grace KelpStone III, Loree Shehata on 11/30/2019 09:47:57

## 2019-12-07 ENCOUNTER — Inpatient Hospital Stay: Payer: Medicare HMO

## 2019-12-07 ENCOUNTER — Emergency Department: Payer: Medicare HMO

## 2019-12-07 ENCOUNTER — Inpatient Hospital Stay
Admission: EM | Admit: 2019-12-07 | Discharge: 2019-12-14 | DRG: 177 | Disposition: A | Discharge status: Skilled Nursing Facility | Payer: Medicare HMO | Attending: Internal Medicine | Admitting: Internal Medicine

## 2019-12-07 DIAGNOSIS — E785 Hyperlipidemia, unspecified: Secondary | ICD-10-CM | POA: Diagnosis present

## 2019-12-07 DIAGNOSIS — J440 Chronic obstructive pulmonary disease with acute lower respiratory infection: Secondary | ICD-10-CM | POA: Diagnosis present

## 2019-12-07 DIAGNOSIS — R059 Cough, unspecified: Secondary | ICD-10-CM

## 2019-12-07 DIAGNOSIS — G4733 Obstructive sleep apnea (adult) (pediatric): Secondary | ICD-10-CM | POA: Diagnosis present

## 2019-12-07 DIAGNOSIS — F0390 Unspecified dementia without behavioral disturbance: Secondary | ICD-10-CM | POA: Diagnosis present

## 2019-12-07 DIAGNOSIS — J9601 Acute respiratory failure with hypoxia: Secondary | ICD-10-CM | POA: Diagnosis present

## 2019-12-07 DIAGNOSIS — L97919 Non-pressure chronic ulcer of unspecified part of right lower leg with unspecified severity: Secondary | ICD-10-CM | POA: Diagnosis not present

## 2019-12-07 DIAGNOSIS — I110 Hypertensive heart disease with heart failure: Secondary | ICD-10-CM | POA: Diagnosis present

## 2019-12-07 DIAGNOSIS — R319 Hematuria, unspecified: Secondary | ICD-10-CM | POA: Diagnosis not present

## 2019-12-07 DIAGNOSIS — E876 Hypokalemia: Secondary | ICD-10-CM | POA: Diagnosis not present

## 2019-12-07 DIAGNOSIS — I10 Essential (primary) hypertension: Secondary | ICD-10-CM | POA: Diagnosis present

## 2019-12-07 DIAGNOSIS — I890 Lymphedema, not elsewhere classified: Secondary | ICD-10-CM

## 2019-12-07 DIAGNOSIS — R531 Weakness: Secondary | ICD-10-CM | POA: Diagnosis present

## 2019-12-07 DIAGNOSIS — L97929 Non-pressure chronic ulcer of unspecified part of left lower leg with unspecified severity: Secondary | ICD-10-CM

## 2019-12-07 DIAGNOSIS — J441 Chronic obstructive pulmonary disease with (acute) exacerbation: Secondary | ICD-10-CM

## 2019-12-07 DIAGNOSIS — U071 COVID-19: Principal | ICD-10-CM | POA: Diagnosis present

## 2019-12-07 DIAGNOSIS — I495 Sick sinus syndrome: Secondary | ICD-10-CM | POA: Diagnosis present

## 2019-12-07 DIAGNOSIS — E1169 Type 2 diabetes mellitus with other specified complication: Secondary | ICD-10-CM | POA: Diagnosis present

## 2019-12-07 DIAGNOSIS — N200 Calculus of kidney: Secondary | ICD-10-CM | POA: Diagnosis present

## 2019-12-07 DIAGNOSIS — Z6841 Body Mass Index (BMI) 40.0 and over, adult: Secondary | ICD-10-CM

## 2019-12-07 DIAGNOSIS — I5032 Chronic diastolic (congestive) heart failure: Secondary | ICD-10-CM | POA: Diagnosis present

## 2019-12-07 DIAGNOSIS — I87333 Chronic venous hypertension (idiopathic) with ulcer and inflammation of bilateral lower extremity: Secondary | ICD-10-CM | POA: Diagnosis not present

## 2019-12-07 DIAGNOSIS — J1282 Pneumonia due to coronavirus disease 2019: Secondary | ICD-10-CM | POA: Diagnosis present

## 2019-12-07 DIAGNOSIS — J449 Chronic obstructive pulmonary disease, unspecified: Secondary | ICD-10-CM | POA: Diagnosis not present

## 2019-12-07 DIAGNOSIS — I872 Venous insufficiency (chronic) (peripheral): Secondary | ICD-10-CM | POA: Diagnosis present

## 2019-12-07 HISTORY — DX: Essential (primary) hypertension: I10

## 2019-12-07 HISTORY — DX: Hyperlipidemia, unspecified: E78.5

## 2019-12-07 HISTORY — DX: Lymphedema, not elsewhere classified: I89.0

## 2019-12-07 HISTORY — DX: Localization-related (focal) (partial) symptomatic epilepsy and epileptic syndromes with simple partial seizures, not intractable, without status epilepticus: G40.109

## 2019-12-07 HISTORY — DX: Chronic obstructive pulmonary disease, unspecified: J44.9

## 2019-12-07 HISTORY — DX: Cerebral infarction, unspecified: I63.9

## 2019-12-07 HISTORY — DX: Morbid (severe) obesity due to excess calories: E66.01

## 2019-12-07 HISTORY — DX: Type 2 diabetes mellitus without complications: E11.9

## 2019-12-07 HISTORY — DX: Sick sinus syndrome: I49.5

## 2019-12-07 HISTORY — DX: Chronic diastolic (congestive) heart failure: I50.32

## 2019-12-07 LAB — COMPREHENSIVE METABOLIC PANEL
ALT: 16 U/L (ref 0–44)
AST: 32 U/L (ref 15–41)
Albumin: 3.7 g/dL (ref 3.5–5.0)
Alkaline Phosphatase: 54 U/L (ref 38–126)
Anion gap: 12 (ref 5–15)
BUN: 8 mg/dL (ref 8–23)
CO2: 27 mmol/L (ref 22–32)
Calcium: 8.4 mg/dL — ABNORMAL LOW (ref 8.9–10.3)
Chloride: 98 mmol/L (ref 98–111)
Creatinine, Ser: 0.85 mg/dL (ref 0.44–1.00)
GFR calc Af Amer: >60 mL/min (ref >60)
GFR calc non Af Amer: >60 mL/min (ref >60)
Glucose, Bld: 255 mg/dL — ABNORMAL HIGH (ref 70–99)
Potassium: 4 mmol/L (ref 3.5–5.1)
Sodium: 137 mmol/L (ref 135–145)
Total Bilirubin: 0.9 mg/dL (ref 0.3–1.2)
Total Protein: 7.7 g/dL (ref 6.5–8.1)

## 2019-12-07 LAB — CBC WITH DIFFERENTIAL/PLATELET
Abs Immature Granulocytes: 0.03 10*3/uL (ref 0.00–0.07)
Basophils Absolute: 0 10*3/uL (ref 0.0–0.1)
Basophils Relative: 0 %
Eosinophils Absolute: 0 10*3/uL (ref 0.0–0.5)
Eosinophils Relative: 0 %
HCT: 38.8 % (ref 36.0–46.0)
Hemoglobin: 12.4 g/dL (ref 12.0–15.0)
Immature Granulocytes: 0 %
Lymphocytes Relative: 6 %
Lymphs Abs: 0.4 10*3/uL — ABNORMAL LOW (ref 0.7–4.0)
MCH: 25.6 pg — ABNORMAL LOW (ref 26.0–34.0)
MCHC: 32 g/dL (ref 30.0–36.0)
MCV: 80.2 fL (ref 80.0–100.0)
Monocytes Absolute: 0.7 10*3/uL (ref 0.1–1.0)
Monocytes Relative: 10 %
Neutro Abs: 5.7 10*3/uL (ref 1.7–7.7)
Neutrophils Relative %: 84 %
Platelets: 175 10*3/uL (ref 150–400)
RBC: 4.84 MIL/uL (ref 3.87–5.11)
RDW: 15.6 % — ABNORMAL HIGH (ref 11.5–15.5)
WBC: 6.8 10*3/uL (ref 4.0–10.5)
nRBC: 0 % (ref 0.0–0.2)

## 2019-12-07 LAB — PROCALCITONIN: Procalcitonin: 0.59 ng/mL

## 2019-12-07 LAB — FERRITIN: Ferritin: 62 ng/mL (ref 11–307)

## 2019-12-07 LAB — MAGNESIUM: Magnesium: 1.8 mg/dL (ref 1.7–2.4)

## 2019-12-07 LAB — BRAIN NATRIURETIC PEPTIDE: B Natriuretic Peptide: 152.7 pg/mL — ABNORMAL HIGH (ref 0.0–100.0)

## 2019-12-07 LAB — GLUCOSE, CAPILLARY: Glucose-Capillary: 235 mg/dL — ABNORMAL HIGH (ref 70–99)

## 2019-12-07 LAB — LACTIC ACID, PLASMA: Lactic Acid, Venous: 1.5 mmol/L (ref 0.5–1.9)

## 2019-12-07 LAB — RESPIRATORY PANEL BY RT PCR (FLU A&B, COVID)
Influenza A by PCR: NEGATIVE
Influenza B by PCR: NEGATIVE
SARS Coronavirus 2 by RT PCR: POSITIVE — AB

## 2019-12-07 LAB — FIBRIN DERIVATIVES D-DIMER (ARMC ONLY): Fibrin derivatives D-dimer (ARMC): 1719.42 ng/mL (FEU) — ABNORMAL HIGH (ref 0.00–499.00)

## 2019-12-07 MED ORDER — LEVETIRACETAM 750 MG PO TABS
750.0000 mg | ORAL_TABLET | Freq: Two times a day (BID) | ORAL | Status: DC
  Administered 2019-12-07 – 2019-12-14 (×14): 750 mg via ORAL
  Filled 2019-12-07 (×16): qty 1

## 2019-12-07 MED ORDER — METOPROLOL SUCCINATE ER 25 MG PO TB24
25.0000 mg | ORAL_TABLET | Freq: Every day | ORAL | Status: DC
  Administered 2019-12-07 – 2019-12-14 (×8): 25 mg via ORAL
  Filled 2019-12-07 (×8): qty 1

## 2019-12-07 MED ORDER — SODIUM CHLORIDE 0.45 % IV SOLN: INTRAVENOUS | Status: DC

## 2019-12-07 MED ORDER — INSULIN ASPART 100 UNIT/ML ~~LOC~~ SOLN
0.0000 [IU] | Freq: Three times a day (TID) | SUBCUTANEOUS | Status: DC
  Administered 2019-12-08: 15 [IU] via SUBCUTANEOUS
  Filled 2019-12-07: qty 1

## 2019-12-07 MED ORDER — SODIUM CHLORIDE 0.9 % IV SOLN
1200.0000 mg | Freq: Once | INTRAVENOUS | Status: DC
  Filled 2019-12-07: qty 10

## 2019-12-07 MED ORDER — EPINEPHRINE 0.3 MG/0.3ML IJ SOAJ
0.3000 mg | Freq: Once | INTRAMUSCULAR | Status: DC | PRN
  Filled 2019-12-07: qty 0.3

## 2019-12-07 MED ORDER — IOHEXOL 350 MG/ML SOLN
100.0000 mL | Freq: Once | INTRAVENOUS | Status: AC | PRN
  Administered 2019-12-07: 100 mL via INTRAVENOUS

## 2019-12-07 MED ORDER — SODIUM CHLORIDE 0.9 % IV SOLN
100.0000 mg | Freq: Every day | INTRAVENOUS | Status: AC
  Administered 2019-12-08 – 2019-12-11 (×4): 100 mg via INTRAVENOUS
  Filled 2019-12-07 (×4): qty 20

## 2019-12-07 MED ORDER — DIPHENHYDRAMINE HCL 50 MG/ML IJ SOLN: 50.0000 mg | Freq: Once | INTRAMUSCULAR | Status: DC | PRN

## 2019-12-07 MED ORDER — ALBUTEROL SULFATE HFA 108 (90 BASE) MCG/ACT IN AERS
2.0000 | INHALATION_SPRAY | Freq: Once | RESPIRATORY_TRACT | Status: DC | PRN
  Filled 2019-12-07: qty 6.7

## 2019-12-07 MED ORDER — SENNA 8.6 MG PO TABS
1.0000 | ORAL_TABLET | Freq: Two times a day (BID) | ORAL | Status: DC
  Administered 2019-12-07 – 2019-12-12 (×6): 8.6 mg via ORAL
  Filled 2019-12-07 (×11): qty 1

## 2019-12-07 MED ORDER — SODIUM CHLORIDE 0.9 % IV SOLN
200.0000 mg | Freq: Once | INTRAVENOUS | Status: AC
  Administered 2019-12-07: 200 mg via INTRAVENOUS
  Filled 2019-12-07: qty 200

## 2019-12-07 MED ORDER — METHYLPREDNISOLONE SODIUM SUCC 125 MG IJ SOLR
60.0000 mg | Freq: Two times a day (BID) | INTRAMUSCULAR | Status: AC
  Administered 2019-12-08 – 2019-12-10 (×6): 60 mg via INTRAVENOUS
  Filled 2019-12-07 (×6): qty 2

## 2019-12-07 MED ORDER — ENOXAPARIN SODIUM 40 MG/0.4ML ~~LOC~~ SOLN
40.0000 mg | SUBCUTANEOUS | Status: DC
  Administered 2019-12-07 – 2019-12-08 (×2): 40 mg via SUBCUTANEOUS
  Filled 2019-12-07 (×2): qty 0.4

## 2019-12-07 MED ORDER — ATORVASTATIN CALCIUM 20 MG PO TABS
20.0000 mg | ORAL_TABLET | Freq: Every day | ORAL | Status: DC
  Administered 2019-12-07 – 2019-12-14 (×8): 20 mg via ORAL
  Filled 2019-12-07 (×8): qty 1

## 2019-12-07 MED ORDER — FAMOTIDINE IN NACL 20-0.9 MG/50ML-% IV SOLN: 20.0000 mg | Freq: Once | INTRAVENOUS | Status: DC | PRN

## 2019-12-07 MED ORDER — METHYLPREDNISOLONE SODIUM SUCC 125 MG IJ SOLR: 125.0000 mg | Freq: Once | INTRAMUSCULAR | Status: DC | PRN

## 2019-12-07 MED ORDER — ACETAMINOPHEN 325 MG PO TABS
650.0000 mg | ORAL_TABLET | Freq: Four times a day (QID) | ORAL | Status: DC | PRN
  Administered 2019-12-08: 01:00:00 650 mg via ORAL
  Filled 2019-12-07: qty 2

## 2019-12-07 MED ORDER — HYDROCOD POLST-CPM POLST ER 10-8 MG/5ML PO SUER
5.0000 mL | Freq: Two times a day (BID) | ORAL | Status: DC | PRN
  Administered 2019-12-07 – 2019-12-08 (×2): 5 mL via ORAL
  Filled 2019-12-07 (×2): qty 5

## 2019-12-07 MED ORDER — PREDNISONE 50 MG PO TABS
50.0000 mg | ORAL_TABLET | Freq: Every day | ORAL | Status: DC
  Administered 2019-12-11 – 2019-12-14 (×4): 50 mg via ORAL
  Filled 2019-12-07 (×4): qty 1

## 2019-12-07 MED ORDER — GUAIFENESIN-DM 100-10 MG/5ML PO SYRP
10.0000 mL | ORAL_SOLUTION | ORAL | Status: DC | PRN
  Administered 2019-12-13 (×2): 10 mL via ORAL
  Filled 2019-12-07 (×5): qty 10

## 2019-12-07 MED ORDER — SODIUM CHLORIDE 0.9 % IV SOLN: INTRAVENOUS | Status: DC | PRN

## 2019-12-07 MED ORDER — SACUBITRIL-VALSARTAN 49-51 MG PO TABS
1.0000 | ORAL_TABLET | Freq: Two times a day (BID) | ORAL | Status: DC
  Administered 2019-12-07 – 2019-12-14 (×14): 1 via ORAL
  Filled 2019-12-07 (×15): qty 1

## 2019-12-07 NOTE — Consult Note (Addendum)
Pharmacy COVID-19 Monoclonal Antibody Screening  Grace Short   This patient does NOT meet the FDA criteria for Emergency Use Authorization of casirivimab/imdevimab or bamlanivimab/etesevimab.  Has a (+) direct SARS-CoV-2 viral test result BUT  IS being hospitalized due to COVID-19   This eligibility and indication for treatment was discussed with the patient's physician Dr Delton Prairie who confirmed pt was hypoxic per most recent update and will likely be admitted FOR COVID on remdesivir and dexamethasone.  Plan: Based on the above discussion, it was decided that the patient will NOT receive a dose of mAB combination.   Albina Billet, PharmD, BCPS Clinical Pharmacist 12/07/2019 3:54 PM

## 2019-12-07 NOTE — Social Work (Signed)
TOC CM/SW consult received for workup on possible SNF placement.  Social worker following.

## 2019-12-07 NOTE — H&P (Signed)
History and Physical    Grace Short IRC:[id number] DOB: 1945/07/22 DOA: 12/07/2019  PCP: Shane Crutch, PA (Confirm with patient/family/NH records and if not entered, this has to be entered at [facility] point of entry) Patient coming from: home  I have personally briefly reviewed patient's old medical records in [facility]  Chief Complaint: increased confusion  HPI: Grace Short is a 74 y.o. female with medical history significant of obesity, OSA, sick sinus syndrome s/p pacer, DM, HTN, HLD, chronic lymphedema, chronic HFpEF. Patient presents to the ED with her roommate and medical POA who provides the majority of history as patient has some baseline dementia with worsening confusion the past 2 days and is unable to provide significant history.  POA indicates that they recently moved to this area from [place] 2-3 years ago. Patient was on 2-3 L home O2 then, she has not been on home oxygen for the past 2 years. She is typically ambulatory with a walker at baseline. Lives at home with POA and her family.  POA reports 2 days of generalized weakness for the patient and bilateral leg weakness such that she is unable to walk at her baseline. POA denies any discrete falls or injuries. She reports the patient has slid partway down her recliner 2 times in the past 2 days, but no discrete falls or injuries.  Patient is pleasantly disoriented and denies pain or complaints.  ED Course: Tmax 99.3  161/68  HR 90 RR 18  O2 sat RA 88%, on 3 L 100%. Lab notable for Mg 1.8, glucose 255, BNP 152.7, lactic acid 1.5, CBC w/ diff normal. CXR clear. Covid +. Patient received monoclonal antibody infusion in ED. TRH called to admit for further evaluation and management for acute hypoxic respiratory failure in Covid Positive patient.   Review of Systems: As per HPI otherwise 10 point review of systems negative.    Past Medical History:  Diagnosis Date  . Chronic acquired lymphedema   . Chronic heart  failure with preserved ejection fraction (HFpEF) (HCC)   . COPD (chronic obstructive pulmonary disease) (HCC)   . CVA (cerebral vascular accident) (HCC)   . DM (diabetes mellitus) (HCC)   . HLD (hyperlipidemia)   . Hypertension   . Morbid obesity with BMI of 50.0-59.9, adult (HCC)   . Partial seizure disorder (HCC)   . SSS (sick sinus syndrome) [facility])     Past Surgical History:  Procedure Laterality Date  . APPENDECTOMY    . BREAST SURGERY    . colectomy     rectal surgery  . HERNIA REPAIR    . PACEMAKER IMPLANT    . TONSILLECTOMY      Soc Hx - caveat 2/2 dementia: single, no children. Did work for [employer] but cannot remember which agency. Lives with friend who hold POA -Grace Short   has no history on file for tobacco use, alcohol use, and drug use.  No Known Allergies  History reviewed. No pertinent family history.   Prior to Admission medications   Medication Sig Start Date End Date Taking? Authorizing Provider  atorvastatin (LIPITOR) 20 MG tablet Take 20 mg by mouth daily. 08/22/19  Yes [provider]  ENTRESTO 49-51 MG Take 1 tablet by mouth 2 (two) times daily. 11/18/19  Yes [provider]  levETIRAcetam (KEPPRA) 750 MG tablet Take 750 mg by mouth 2 (two) times daily. 10/31/19  Yes [provider]  metoprolol succinate (TOPROL-XL) 25 MG 24 hr tablet Take 25 mg  by mouth daily. 10/15/19  Yes [provider]    Physical Exam: Vitals:   12/07/19 1328 12/07/19 1546 12/07/19 1745  BP: (!) 165/70 (!) 161/68 (!) 163/77  Pulse: 95 90 89  Resp: 17 18 19   Temp: 99.3 F (37.4 C)    TempSrc: Oral    SpO2: 100% 95% 98%     Vitals:   12/07/19 1328 12/07/19 1546 12/07/19 1745  BP: (!) 165/70 (!) 161/68 (!) 163/77  Pulse: 95 90 89  Resp: 17 18 19   Temp: 99.3 F (37.4 C)    TempSrc: Oral    SpO2: 100% 95% 98%   General: morbidly obese woman in no distress Eyes: PERRL, lids and conjunctivae normal, mild conjunctival  exudate ENMT: Mucous membranes are moist. Posterior pharynx clear of any exudate or lesions.Normal dentition.  Neck: obese normal, supple, palpation hindered by obesity Respiratory: decreased air movement, clear to auscultation bilaterally, no wheezing, no crackles. Normal respiratory effort. No accessory muscle use.  Cardiovascular: Regular rate and rhythm, no murmurs / rubs / gallops. No extremity edema. 2+ pedal pulses. No carotid bruits.  Abdomen: morbidly obese,no tenderness. Bowel sounds positive.  Musculoskeletal: no clubbing. Feet are  Cyanotic.  No joint deformity upper and lower extremities. Good ROM, no contractures. poor muscle tone.  Skin: mycotic, long toenails. Unna boots with self-adherent wrap on both legs to level of knees.  Neurologic: CN 2-12 grossly intact.   Psychiatric: Poor insight. Alert and oriented to self. Normal mood.     Labs on Admission: I have personally reviewed following labs and imaging studies  CBC: Recent Labs  Lab 12/07/19 1340  WBC 6.8  NEUTROABS 5.7  HGB 12.4  HCT 38.8  MCV 80.2  PLT 175   Basic Metabolic Panel: Recent Labs  Lab 12/07/19 1337 12/07/19 1340  NA  --  137  K  --  4.0  CL  --  98  CO2  --  27  GLUCOSE  --  255*  BUN  --  8  CREATININE  --  0.85  CALCIUM  --  8.4*  MG 1.8  --    GFR: CrCl cannot be calculated (Unknown ideal weight.). Liver Function Tests: Recent Labs  Lab 12/07/19 1340  AST 32  ALT 16  ALKPHOS 54  BILITOT 0.9  PROT 7.7  ALBUMIN 3.7   No results for input(s): LIPASE, AMYLASE in the last 168 hours. No results for input(s): AMMONIA in the last 168 hours. Coagulation Profile: No results for input(s): INR, PROTIME in the last 168 hours. Cardiac Enzymes: No results for input(s): CKTOTAL, CKMB, CKMBINDEX, TROPONINI in the last 168 hours. BNP (last 3 results) No results for input(s): PROBNP in the last 8760 hours. HbA1C: No results for input(s): HGBA1C in the last 72 hours. CBG: No results  for input(s): GLUCAP in the last 168 hours. Lipid Profile: No results for input(s): CHOL, HDL, LDLCALC, TRIG, CHOLHDL, LDLDIRECT in the last 72 hours. Thyroid Function Tests: No results for input(s): TSH, T4TOTAL, FREET4, T3FREE, THYROIDAB in the last 72 hours. Anemia Panel: No results for input(s): VITAMINB12, FOLATE, FERRITIN, TIBC, IRON, RETICCTPCT in the last 72 hours. Urine analysis: No results found for: COLORURINE, APPEARANCEUR, LABSPEC, PHURINE, GLUCOSEU, HGBUR, BILIRUBINUR, KETONESUR, PROTEINUR, UROBILINOGEN, NITRITE, LEUKOCYTESUR  Radiological Exams on Admission: DG Chest 1 View  Result Date: 12/07/2019 CLINICAL DATA:  Status post fall x2 today. EXAM: CHEST  1 VIEW COMPARISON:  None. FINDINGS: Pacing device in place. Heart size is normal. Lungs clear. No  pneumothorax or pleural fluid. No acute or focal bony abnormality. IMPRESSION: No acute disease. Electronically Signed   By: Drusilla Kanner M.D.   On: 12/07/2019 14:57   DG Pelvis 1-2 Views  Result Date: 12/07/2019 CLINICAL DATA:  Pelvic pain after a fall today.  Initial encounter. EXAM: PELVIS - 1-2 VIEW COMPARISON:  None. FINDINGS: There is no evidence of pelvic fracture or diastasis. No pelvic bone lesions are seen. Lower lumbar degenerative change and mild to moderate osteoarthritis about the hips noted. IMPRESSION: No acute abnormality. Electronically Signed   By: Drusilla Kanner M.D.   On: 12/07/2019 14:59   DG Knee 2 Views Left  Result Date: 12/07/2019 CLINICAL DATA:  Status post fall today.  Right knee pain. EXAM: LEFT KNEE - 1-2 VIEW COMPARISON:  None. FINDINGS: No acute bony or joint abnormality. Mild to moderate degenerative change about the knee noted. No joint effusion is seen. No chondrocalcinosis. IMPRESSION: No acute abnormality. Mild to moderate appearing osteoarthritis. Electronically Signed   By: Drusilla Kanner M.D.   On: 12/07/2019 14:57   DG Knee 2 Views Right  Result Date: 12/07/2019 CLINICAL DATA:   Status post fall today.  Initial encounter. EXAM: RIGHT KNEE - 1-2 VIEW COMPARISON:  None. FINDINGS: There is no acute bony or joint abnormality. Tricompartmental degenerative disease is worst medially. No joint effusion or chondrocalcinosis. IMPRESSION: No acute abnormality. 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 Electronically Signed   By: Drusilla Kanner M.D.   On: 12/07/2019 14:58   CT Head Wo Contrast  Result Date: 12/07/2019 CLINICAL DATA:  Mental status change.  Falls.  Weakness. EXAM: CT HEAD WITHOUT CONTRAST TECHNIQUE: Contiguous axial images were obtained from the base of the skull through the vertex without intravenous contrast. COMPARISON:  None. FINDINGS: Brain: No evidence of acute large vascular territory infarction, hemorrhage, hydrocephalus, extra-axial collection or mass lesion/mass effect. Patchy white matter hypoattenuation, which is nonspecific, but most likely secondary to chronic microvascular ischemic disease. Mild diffuse cerebral volume loss. Vascular: Calcific intracranial atherosclerosis. Skull: No acute fracture. Sinuses/Orbits: Mild right sphenoid sinus mucosal thickening. Otherwise, the sinuses are clear. No acute orbital abnormality. Other: No mastoid effusion. IMPRESSION: 1. No evidence of acute intracranial abnormality. 2. 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 Electronically Signed   By: Feliberto Harts MD   On: 12/07/2019 14:29    EKG: Independently reviewed. Atrial sensed paced rhythm  Assessment/Plan Active Problems:   COVID-19 virus infection   Chronic heart failure with preserved ejection fraction (HFpEF) (HCC)   Edema of both lower extremities due to peripheral venous insufficiency   Stasis edema with ulcer and inflammation, bilateral (HCC)   SSS (sick sinus syndrome) (HCC)   COPD (chronic obstructive pulmonary disease) (HCC)   HTN (hypertension)   Acute respiratory failure with hypoxemia (HCC)     1. Covid 19 with  acute respiratory failure with hypoxemia - patient with COPD and h/o home oxygen use but not since 2019 now with increased confusion, hypoxemia with sat of 87-88% on room air. Suboptimal exam due to obesity and immobility. Plan Med-surg admit  CTA to r/o PE as cause of hypoxemia in high risk patient, also for more sensitive eval   For covid pneumonia  Acute covid treatment: remdesivir and IV steroids  Oxygen therapy to maintain O2 sat 92%  Covid admission protocols  2. HFpEF - mild elevation in BNP. Takes cardiac meds. No evidence of acute decompensation. Last echo  June 28, 2019 with EF 55% and grade I diastolic  dysfxn Plan Continue home meds: Entresto, Toprol XL  3. Chronic lymphedema - patient with venous insufficiency related to HF with stasis ulcers. Recently seen wound care clinic Spet 14, 2021 and started with Unna Boot compression therapy. Plan  continue [employer] with dressing changes M,W, Sat. (did have dressing change today by   home health)  4. DM - on no medication at time of admission Plan A1C  Sliding scale coverage.   5. COPD - long standing problem but no accessible history  Plan Albuterol MDI prn  6. Disposition - difficult to manage at home. TOC consult placed.  DVT prophylaxis: lovenox  Code Status: full code Family Communication: left message for [employer], [place]. Advised her to be covid tested.  Disposition Plan: TBD  Consults called: TOC  Admission status: inpatient    Illene Regulus MD [facility] Pager [phone]  If 7PM-7AM, please contact night-coverage [email or web address] Password [facility]  12/07/2019, 5:55 PM

## 2019-12-07 NOTE — ED Provider Notes (Signed)
[facility] Emergency Department Provider Note ____________________________________________   First MD Initiated Contact with Patient 12/07/19 1315     (approximate)  I have reviewed the triage vital signs and the nursing notes.  HISTORY  Chief Complaint Weakness and Fall   HPI Grace Short is a 74 y.o. femalewho presents to the ED for evaluation of weakness.   Chart review indicates history of obesity, OSA, sick sinus syndrome s/p pacer, DM, HTN, HLD, chronic lymphedema. Patient presents to the ED with her roommate and medical POA who provides the majority of history as patient has some baseline dementia with worsening confusion the past 2 days and is unable to provide significant history.  POA indicates that they recently moved to this area from [place] 2-3 years ago. Patient was on 2-3 L home O2 then, she has not been on home oxygen for the past 2 years. She is typically ambulatory with a walker at baseline. Lives at home with POA and her family.  POA reports 2 days of generalized weakness for the patient and bilateral leg weakness such that she is unable to walk at her baseline. POA denies any discrete falls or injuries. She reports the patient has slid partway down her recliner 2 times in the past 2 days, but no discrete falls or injuries.  Patient is pleasantly disoriented and denies pain or complaints.   Past Medical History:  Diagnosis Date  . COPD (chronic obstructive pulmonary disease) (HCC)   . Hypertension     There are no problems to display for this patient.   History reviewed. No pertinent surgical history.  Prior to Admission medications   Not on File    Allergies Patient has no known allergies.  History reviewed. No pertinent family history.  Social History Social History   Tobacco Use  . Smoking status: Unknown If Ever Smoked  Substance Use Topics  . Alcohol use: Not on file  . Drug use: Not on file    Review of  Systems  Unable to be accurately obtained due to patient's confusion ____________________________________________   PHYSICAL EXAM:  VITAL SIGNS: Vitals:   12/07/19 1328  BP: (!) 165/70  Pulse: 95  Resp: 17  Temp: 99.3 F (37.4 C)  SpO2: 100%      Constitutional: Alert and oriented to person and place. Disoriented to situation and time. Obese. Supine in bed Eyes: Conjunctivae are normal. PERRL. EOMI. Head: Atraumatic. Copious hirsutism. Nose: No congestion/rhinnorhea. Mouth/Throat: Mucous membranes are moist.  Oropharynx non-erythematous. Neck: No stridor. No cervical spine tenderness to palpation. Cardiovascular: Normal rate, regular rhythm. Grossly normal heart sounds.  Good peripheral circulation. Respiratory: Normal respiratory effort.  No retractions. Lungs CTAB. Gastrointestinal: Soft , nondistended, nontender to palpation. No abdominal bruits. No CVA tenderness. Musculoskeletal:  No signs of acute trauma or deformity.  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 Neurologic:  Normal speech and language. No gross focal neurologic deficits are appreciated. No gait instability noted. Skin:  Skin is warm, dry and intact. No rash noted. Psychiatric: Mood and affect are normal. Speech and behavior are normal.  ____________________________________________   LABS (all labs ordered are listed, but only abnormal results are displayed)  Labs Reviewed  RESPIRATORY PANEL BY RT PCR (FLU A&B, COVID) - Abnormal; Notable for the following components:      Result Value   SARS Coronavirus 2 by RT PCR POSITIVE (*)  All other components within normal limits  CBC WITH DIFFERENTIAL/PLATELET - Abnormal; Notable for the following components:   MCH 25.6 (*)    RDW 15.6 (*)    Lymphs Abs 0.4 (*)    All other components within normal limits  COMPREHENSIVE METABOLIC PANEL - Abnormal; Notable  for the following components:   Glucose, Bld 255 (*)    Calcium 8.4 (*)    All other components within normal limits  BRAIN NATRIURETIC PEPTIDE - Abnormal; Notable for the following components:   B Natriuretic Peptide 152.7 (*)    All other components within normal limits  LACTIC ACID, PLASMA  MAGNESIUM  URINALYSIS, COMPLETE (UACMP) WITH MICROSCOPIC   ____________________________________________  12 Lead EKG   ____________________________________________  RADIOLOGY  ED MD interpretation: 1 view CXR without evidence of acute cardiopulmonary pathology.  1 view pelvis, bilateral knees without evidence of acute fracture or bony abnormality.  Official radiology report(s): DG Chest 1 View  Result Date: 12/07/2019 CLINICAL DATA:  Status post fall x2 today. EXAM: CHEST  1 VIEW COMPARISON:  None. FINDINGS: Pacing device in place. Heart size is normal. Lungs clear. No pneumothorax or pleural fluid. No acute or focal bony abnormality. IMPRESSION: No acute disease. Electronically Signed   By: Drusilla Kanner M.D.   On: 12/07/2019 14:57   DG Pelvis 1-2 Views  Result Date: 12/07/2019 CLINICAL DATA:  Pelvic pain after a fall today.  Initial encounter. EXAM: PELVIS - 1-2 VIEW COMPARISON:  None. FINDINGS: There is no evidence of pelvic fracture or diastasis. No pelvic bone lesions are seen. Lower lumbar degenerative change and mild to moderate osteoarthritis about the hips noted. IMPRESSION: No acute abnormality. Electronically Signed   By: Drusilla Kanner M.D.   On: 12/07/2019 14:59   DG Knee 2 Views Left  Result Date: 12/07/2019 CLINICAL DATA:  Status post fall today.  Right knee pain. EXAM: LEFT KNEE - 1-2 VIEW COMPARISON:  None. FINDINGS: No acute bony or joint abnormality. Mild to moderate degenerative change about the knee noted. No joint effusion is seen. No chondrocalcinosis. IMPRESSION: No acute abnormality. Mild to moderate appearing osteoarthritis. Electronically Signed   By: Drusilla Kanner M.D.   On: 12/07/2019 14:57   DG Knee 2 Views Right  Result Date: 12/07/2019 CLINICAL DATA:  Status post fall today.  Initial encounter. EXAM: RIGHT KNEE - 1-2 VIEW COMPARISON:  None. FINDINGS: There is no acute bony or joint abnormality. Tricompartmental degenerative disease is worst medially. No joint effusion or chondrocalcinosis. IMPRESSION: No acute abnormality. 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 Electronically Signed   By: Drusilla Kanner M.D.   On: 12/07/2019 14:58   CT Head Wo Contrast  Result Date: 12/07/2019 CLINICAL DATA:  Mental status change.  Falls.  Weakness. EXAM: CT HEAD WITHOUT CONTRAST TECHNIQUE: Contiguous axial images were obtained from the base of the skull through the vertex without intravenous contrast. COMPARISON:  None. FINDINGS: Brain: No evidence of acute large vascular territory infarction, hemorrhage, hydrocephalus, extra-axial collection or mass lesion/mass effect. Patchy white matter hypoattenuation, which is nonspecific, but most likely secondary to chronic microvascular ischemic disease. Mild diffuse cerebral volume loss. Vascular: Calcific intracranial atherosclerosis. Skull: No acute fracture. Sinuses/Orbits: Mild right sphenoid sinus mucosal thickening. Otherwise, the sinuses are clear. No acute orbital abnormality. Other: No mastoid effusion. IMPRESSION: 1. No evidence of acute intracranial abnormality. 2. 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 Electronically Signed   By: Feliberto Harts MD   On: 12/07/2019 14:29    ____________________________________________  PROCEDURES and INTERVENTIONS  Procedure(s) performed (including Critical Care):  Procedures  Medications - No data to display  ____________________________________________   [facility]M / ED COURSE  74 year old female presents from home with generalized weakness and inability to ambulate at baseline, found to have evidence of COVID-19 and requiring  medical observation admission. Vital signs normal on room air. Exam with weak and pleasantly disoriented patient without evidence of distress or focal neurologic deficits. While she has no signs of discrete trauma, her POA is quite concerned about her lower extremities in the setting of sliding off of her recliner, and so plain films of the pelvis and knees obtained without evidence of acute bony abnormality. Blood work is at her baseline. CXR without infiltrates. Patient's Covid swab returns positive for COVID-19, as a likely source of her progressively worsening generalized weakness. Due to patient's inability to care for herself and ambulate at her baseline, will consult hospitalist for observation admission for further work-up and management.   Clinical Course as of Dec 07 1518  Sat Dec 07, 2019  1317 Called roommate and [place], Lady Gary walker, who is out front.  She reports that she has bag full of patient's medications.  She is coming into the ED now to sit with the patient.   [DS]  1420 MCV: 80.2 [EU]    Clinical Course User Index [DS] Delton Prairie, MD [EU] Gigi Gin, Student-PA     ____________________________________________   FINAL CLINICAL IMPRESSION(S) / ED DIAGNOSES  Final diagnoses:  COVID-19  Generalized weakness     ED Discharge Orders    None       Nichlas Pitera   Note:  This document was prepared using Dragon voice recognition software and may include unintentional dictation errors.   Delton Prairie, MD 12/07/19 [phone]

## 2019-12-07 NOTE — ED Triage Notes (Signed)
Per [facility], called to pt's home d/t pt "falling twice" today. No complaints of injury. Family stated to EMS that pt has "been weak in the legs for about 2 days". Pt is Alert and oriented, but states the year is "19 something". Per EMS, pt has COPD and was on home O2 while living in [place], but since moving here they d/c'ed o2, reason unknown.

## 2019-12-07 NOTE — ED Notes (Signed)
Pt SPO2 88% on RA. Placed pt on 2LNC. SPO2 increased to 95%. Provider made aware.

## 2019-12-08 ENCOUNTER — Other Ambulatory Visit: Payer: Self-pay

## 2019-12-08 ENCOUNTER — Encounter: Payer: Self-pay | Admitting: Internal Medicine

## 2019-12-08 DIAGNOSIS — E785 Hyperlipidemia, unspecified: Secondary | ICD-10-CM

## 2019-12-08 DIAGNOSIS — Z6841 Body Mass Index (BMI) 40.0 and over, adult: Secondary | ICD-10-CM

## 2019-12-08 DIAGNOSIS — E1169 Type 2 diabetes mellitus with other specified complication: Secondary | ICD-10-CM

## 2019-12-08 DIAGNOSIS — R531 Weakness: Secondary | ICD-10-CM

## 2019-12-08 DIAGNOSIS — I890 Lymphedema, not elsewhere classified: Secondary | ICD-10-CM

## 2019-12-08 LAB — COMPREHENSIVE METABOLIC PANEL
ALT: 18 U/L (ref 0–44)
AST: 43 U/L — ABNORMAL HIGH (ref 15–41)
Albumin: 3.2 g/dL — ABNORMAL LOW (ref 3.5–5.0)
Alkaline Phosphatase: 51 U/L (ref 38–126)
Anion gap: 10 (ref 5–15)
BUN: 8 mg/dL (ref 8–23)
CO2: 28 mmol/L (ref 22–32)
Calcium: 8.2 mg/dL — ABNORMAL LOW (ref 8.9–10.3)
Chloride: 99 mmol/L (ref 98–111)
Creatinine, Ser: 0.72 mg/dL (ref 0.44–1.00)
GFR calc Af Amer: >60 mL/min (ref >60)
GFR calc non Af Amer: >60 mL/min (ref >60)
Glucose, Bld: 272 mg/dL — ABNORMAL HIGH (ref 70–99)
Potassium: 3.7 mmol/L (ref 3.5–5.1)
Sodium: 137 mmol/L (ref 135–145)
Total Bilirubin: 0.7 mg/dL (ref 0.3–1.2)
Total Protein: 7.4 g/dL (ref 6.5–8.1)

## 2019-12-08 LAB — C-REACTIVE PROTEIN
CRP: 14.8 mg/dL — ABNORMAL HIGH (ref <1.0)
CRP: 19.4 mg/dL — ABNORMAL HIGH (ref <1.0)

## 2019-12-08 LAB — GLUCOSE, CAPILLARY
Glucose-Capillary: 333 mg/dL — ABNORMAL HIGH (ref 70–99)
Glucose-Capillary: 325 mg/dL — ABNORMAL HIGH (ref 70–99)
Glucose-Capillary: 315 mg/dL — ABNORMAL HIGH (ref 70–99)
Glucose-Capillary: 304 mg/dL — ABNORMAL HIGH (ref 70–99)

## 2019-12-08 LAB — HEMOGLOBIN A1C
Hgb A1c MFr Bld: 7.6 % — ABNORMAL HIGH (ref 4.8–5.6)
Mean Plasma Glucose: 171.42 mg/dL

## 2019-12-08 MED ORDER — BARICITINIB 2 MG PO TABS
4.0000 mg | ORAL_TABLET | Freq: Every day | ORAL | Status: DC
  Administered 2019-12-08 – 2019-12-14 (×7): 4 mg via ORAL
  Filled 2019-12-08 (×7): qty 2

## 2019-12-08 MED ORDER — FUROSEMIDE 20 MG PO TABS
20.0000 mg | ORAL_TABLET | Freq: Two times a day (BID) | ORAL | Status: DC
  Administered 2019-12-08 – 2019-12-10 (×4): 20 mg via ORAL
  Filled 2019-12-08 (×4): qty 1

## 2019-12-08 MED ORDER — INSULIN ASPART 100 UNIT/ML ~~LOC~~ SOLN
3.0000 [IU] | Freq: Three times a day (TID) | SUBCUTANEOUS | Status: DC
  Administered 2019-12-08 – 2019-12-09 (×4): 3 [IU] via SUBCUTANEOUS
  Filled 2019-12-08 (×4): qty 1

## 2019-12-08 MED ORDER — INSULIN ASPART 100 UNIT/ML ~~LOC~~ SOLN
0.0000 [IU] | Freq: Every day | SUBCUTANEOUS | Status: DC
  Administered 2019-12-08: 4 [IU] via SUBCUTANEOUS
  Administered 2019-12-09: 22:00:00 3 [IU] via SUBCUTANEOUS
  Administered 2019-12-12: 23:00:00 2 [IU] via SUBCUTANEOUS
  Administered 2019-12-13: 5 [IU] via SUBCUTANEOUS
  Filled 2019-12-08 (×4): qty 1

## 2019-12-08 MED ORDER — INSULIN ASPART 100 UNIT/ML ~~LOC~~ SOLN
0.0000 [IU] | Freq: Three times a day (TID) | SUBCUTANEOUS | Status: DC
  Administered 2019-12-08 (×2): 11 [IU] via SUBCUTANEOUS
  Administered 2019-12-09: 08:00:00 8 [IU] via SUBCUTANEOUS
  Administered 2019-12-09: 12:00:00 15 [IU] via SUBCUTANEOUS
  Administered 2019-12-09 – 2019-12-10 (×3): 8 [IU] via SUBCUTANEOUS
  Administered 2019-12-10: 17:00:00 5 [IU] via SUBCUTANEOUS
  Administered 2019-12-11: 17:00:00 3 [IU] via SUBCUTANEOUS
  Administered 2019-12-11 (×2): 2 [IU] via SUBCUTANEOUS
  Administered 2019-12-12: 5 [IU] via SUBCUTANEOUS
  Administered 2019-12-12 – 2019-12-13 (×2): 8 [IU] via SUBCUTANEOUS
  Administered 2019-12-13: 17:00:00 15 [IU] via SUBCUTANEOUS
  Filled 2019-12-08 (×15): qty 1

## 2019-12-08 MED ORDER — INSULIN GLARGINE 100 UNIT/ML ~~LOC~~ SOLN
20.0000 [IU] | Freq: Two times a day (BID) | SUBCUTANEOUS | Status: DC
  Administered 2019-12-08 – 2019-12-09 (×3): 20 [IU] via SUBCUTANEOUS
  Filled 2019-12-08 (×4): qty 0.2

## 2019-12-08 NOTE — Evaluation (Signed)
Physical Therapy Evaluation Patient Details Name: Grace Short. Veracruz MRN: [id number] DOB: 08-18-1945 Today's Date: 12/08/2019   History of Present Illness  Patient is a 74 year old female admitted for evaluation of weakness with fall at home. Found to have Covid 19 with acute respiratory failure with hypoxemia  with history of COPD and home oxygen use. Increased confusion with baseline dementia. Chronic lymphedema with venous insufficiency related to HF with stasis ulcers with unna boot therapy. Patient is typically ambulatory with a walker at home.    Clinical Impression  PT evaluation completed. Patient is cooperative and able to follow single step commands without difficulty. Patient participated with LE strengthening therapeutic exercises in bed. Max A - Total A for bed mobility with verbal cues for sequencing and technique. Poor sitting balance with sitting activity tolerance limited to 2 minutes. Posterior lean that required Min A - Mod A to maintain sitting balance. Patient has generalized weakness and is unable to attempt standing at this time. Recommend PT to maximize independence and to address functional limitations listed below. Patient is not at her baseline level of functional mobility as she was ambulatory for short distance with a walker before admission. Recommend SNF placement at discharge.     Follow Up Recommendations SNF    Equipment Recommendations   (to be determined at next level of care )    Recommendations for Other Services       Precautions / Restrictions Precautions Precautions: Fall Restrictions Weight Bearing Restrictions: No      Mobility  Bed Mobility Overal bed mobility: Needs Assistance Bed Mobility: Supine to Sit;Sit to Supine     Supine to sit: Max assist Sit to supine: Total assist   General bed mobility comments: verbal cues for sequencing and technique. Head of bed was elevated and bed rails used to facilitate independence. Significant assistance  required for bed mobility   Transfers                 General transfer comment: transfers not assessed due to limited activity tolerance in sitting position, generalized weakness, and impaired sitting balance.   Ambulation/Gait                Stairs            Wheelchair Mobility    Modified Rankin (Stroke Patients Only)       Balance Overall balance assessment: Needs assistance Sitting-balance support: Bilateral upper extremity supported;Feet unsupported Sitting balance-Leahy Scale: Poor Sitting balance - Comments: patient needs Min A- Mod A for posteior lean in sitting position. sitting tolerance less than 2 minutes                                      Pertinent Vitals/Pain Pain Assessment: Faces Faces Pain Scale: Hurts a little bit Pain Location: left leg  Pain Descriptors / Indicators: Sore Pain Intervention(s): Monitored during session    Home Living Family/patient expects to be discharged to:: Private residence Living Arrangements: Other (Comment) (lives with roommates) Available Help at Discharge: Friend(s) Type of Home: House Home Access: Stairs to enter     Home Layout: Able to live on main level with bedroom/bathroom Home Equipment: Dan Humphreys - 2 wheels Additional Comments: patient is a poor historian.     Prior Function Level of Independence: Needs assistance   Gait / Transfers Assistance Needed: patient reports she walks short distance with supervision from roommate  using a walker      Comments: patient reports she sleeps in a recliner chair and needs assistance for transfers      Hand Dominance   Dominant Hand: Right    Extremity/Trunk Assessment   Upper Extremity Assessment Upper Extremity Assessment: RUE deficits/detail;LUE deficits/detail RUE Deficits / Details: active shoulder flexion to less than 90 degrees (patient states chronic issues) with generalized weakness throughout  LUE Deficits / Details: AROM  shoulder flexion is [facility] for functional activity. strength grossly 4/5 throughout     Lower Extremity Assessment Lower Extremity Assessment: RLE deficits/detail;LLE deficits/detail RLE Deficits / Details: AROM limited by generalized weakness and body habitus. Unna boots in place  RLE Sensation: WNL LLE Deficits / Details: AROM limited by generalized weakness and body habitus. Unna boots in place  LLE Sensation: WNL       Communication   Communication: No difficulties  Cognition Arousal/Alertness: Awake/alert Behavior During Therapy: WFL for tasks assessed/performed Overall Cognitive Status: No family/caregiver present to determine baseline cognitive functioning                                 General Comments: patient is able to follow all single step commands without difficulty. patient is disoriented to time       General Comments      Exercises General Exercises - Lower Extremity Ankle Circles/Pumps: AAROM;Strengthening;Both;10 reps;Supine Heel Slides: AAROM;Strengthening;Both;10 reps;Supine Hip ABduction/ADduction: AAROM;Strengthening;Both;10 reps;Supine Straight Leg Raises: AAROM;Strengthening;Both;10 reps;Supine Other Exercises Other Exercises: verbal cues for technique    Assessment/Plan    PT Assessment Patient needs continued PT services  PT Problem List Decreased strength;Decreased activity tolerance;Decreased balance;Decreased mobility;Decreased safety awareness;Obesity       PT Treatment Interventions DME instruction;Gait training;Stair training;Functional mobility training;Therapeutic activities;Therapeutic exercise;Balance training;Neuromuscular re-education;Patient/family education    PT Goals (Current goals can be found in the Care Plan section)  Acute Rehab PT Goals Patient Stated Goal: to get stronger  PT Goal Formulation: With patient Time For Goal Achievement: 12/22/19 Potential to Achieve Goals: Fair    Frequency Min 2X/week    Barriers to discharge        Co-evaluation               AM-PAC PT "6 Clicks" Mobility  Outcome Measure Help needed turning from your back to your side while in a flat bed without using bedrails?: A Lot Help needed moving from lying on your back to sitting on the side of a flat bed without using bedrails?: Total Help needed moving to and from a bed to a chair (including a wheelchair)?: Total Help needed standing up from a chair using your arms (e.g., wheelchair or bedside chair)?: Total Help needed to walk in hospital room?: Total Help needed climbing 3-5 steps with a railing? : Total 6 Click Score: 7    End of Session Equipment Utilized During Treatment: Oxygen Activity Tolerance: Patient limited by fatigue Patient left: in bed;with call bell/phone within reach;with bed alarm set Nurse Communication: Mobility status PT Visit Diagnosis: Unsteadiness on feet (R26.81);Muscle weakness (generalized) (M62.81);Difficulty in walking, not elsewhere classified (R26.2)    Time: 1610-9604 PT Time Calculation (min) (ACUTE ONLY): 24 min   Charges:   PT Evaluation $PT Eval Moderate Complexity: 1 Mod PT Treatments $Therapeutic Exercise: 8-22 mins        Donna Bernard, PT, MPT   Ina Homes 12/08/2019, 11:30 AM

## 2019-12-08 NOTE — Progress Notes (Signed)
   12/08/19 0052  Assess: MEWS Score  Temp (!) 102.2 F (39 C)  BP (!) 163/75  Pulse Rate 85  Resp 20  SpO2 90 %  O2 Device Nasal Cannula  O2 Flow Rate (L/min) 2 L/min  Assess: MEWS Score  MEWS Temp 2  MEWS Systolic 0  MEWS Pulse 0  MEWS RR 0  MEWS LOC 0  MEWS Score 2  MEWS Score Color Yellow  Assess: if the MEWS score is Yellow or Red  Were vital signs taken at a resting state? Yes  Focused Assessment No change from prior assessment  Early Detection of Sepsis Score *See Row Information* Medium  MEWS guidelines implemented *See Row Information* Yes  Treat  MEWS Interventions Administered prn meds/treatments  Pain Scale 0-10  Pain Score 0  Patients response to intervention  (Tylenol given )  Take Vital Signs  Increase Vital Sign Frequency  Yellow: Q 2hr X 2 then Q 4hr X 2, if remains yellow, continue Q 4hrs  Notify: Charge Nurse/RN  Name of Charge Nurse/RN Notified Brandi RN  Date Charge Nurse/RN Notified 12/08/19  Time Charge Nurse/RN Notified 0052  Document  Patient Outcome Stabilized after interventions

## 2019-12-08 NOTE — Plan of Care (Signed)
  Problem: Education: Goal: Knowledge of General Education information will improve Description Including pain rating scale, medication(s)/side effects and non-pharmacologic comfort measures Outcome: Progressing   

## 2019-12-08 NOTE — Consult Note (Signed)
WOC Nurse Consult Note: Reason for Consult: Consult received for management of lymphedema with Unna's Boots.  WOC Nurse will see on Monday, 12/09/19. Wound type:lymphedema, venous insufficiency with wound  WOC nursing team will follow, and will remain available to this patient, the nursing and medical teams.  Please re-consult if needed. Thanks, Ladona Mow, MSN, RN, GNP, [place]  Pager# [phone]

## 2019-12-08 NOTE — Progress Notes (Addendum)
Patient ID: Grace Short, female   DOB: 14-Apr-1945, 74 y.o.   MRN: [id number] [facility] PROGRESS NOTE  [place] WUJ:[id number] DOB: 14-Feb-1946 DOA: 12/07/2019 PCP: Shane Crutch, PA  HPI/Subjective: Patient with some cough and shortness of breath.  Felt a little bit better than when she came in.  Feels weak.  Noted with COVID-19 pneumonia and acute hypoxic respiratory failure.  Objective: Vitals:   12/08/19 0827 12/08/19 1149  BP: (!) 150/70 (!) 153/67  Pulse: 80 67  Resp: 20 18  Temp: (!) 97.5 F (36.4 C) 98.4 F (36.9 C)  SpO2: 93% 96%    Intake/Output Summary (Last 24 hours) at 12/08/2019 1222 Last data filed at 12/08/2019 9562 Gross per 24 hour  Intake 240 ml  Output 650 ml  Net -410 ml   There were no vitals filed for this visit.  ROS: Review of Systems  Respiratory: Positive for cough and shortness of breath.   Cardiovascular: Negative for chest pain.  Gastrointestinal: Negative for abdominal pain, nausea and vomiting.   Exam: Physical Exam HENT:     Head: Normocephalic.     Mouth/Throat:     Pharynx: No oropharyngeal exudate.  Eyes:     General: Lids are normal.     Conjunctiva/sclera: Conjunctivae normal.  Cardiovascular:     Rate and Rhythm: Normal rate and regular rhythm.     Heart sounds: Normal heart sounds, S1 normal and S2 normal.  Pulmonary:     Breath sounds: Examination of the right-lower field reveals decreased breath sounds and rhonchi. Examination of the left-lower field reveals decreased breath sounds and rhonchi. Decreased breath sounds and rhonchi present. No wheezing or rales.  Abdominal:     Palpations: Abdomen is soft.     Tenderness: There is no abdominal tenderness.  Musculoskeletal:     Right ankle: Swelling present.     Left ankle: Swelling present.  Skin:    General: Skin is warm.     Findings: No rash.     Comments: Lower legs covered by wraps.  Neurological:     Mental Status: She is alert and oriented to person,  place, and time.       Data Reviewed: Basic Metabolic Panel: Recent Labs  Lab 12/07/19 1337 12/07/19 1340 12/08/19 0422  NA  --  137 137  K  --  4.0 3.7  CL  --  98 99  CO2  --  27 28  GLUCOSE  --  255* 272*  BUN  --  8 8  CREATININE  --  0.85 0.72  CALCIUM  --  8.4* 8.2*  MG 1.8  --   --    Liver Function Tests: Recent Labs  Lab 12/07/19 1340 12/08/19 0422  AST 32 43*  ALT 16 18  ALKPHOS 54 51  BILITOT 0.9 0.7  PROT 7.7 7.4  ALBUMIN 3.7 3.2*   CBC: Recent Labs  Lab 12/07/19 1340  WBC 6.8  NEUTROABS 5.7  HGB 12.4  HCT 38.8  MCV 80.2  PLT 175   BNP (last 3 results) Recent Labs    12/07/19 1337  BNP 152.7*    CBG: Recent Labs  Lab 12/07/19 2041 12/08/19 0757 12/08/19 1146  GLUCAP 235* 333* 325*    Recent Results (from the past 240 hour(s))  Respiratory Panel by RT PCR (Flu A&B, Covid) - Nasopharyngeal Swab     Status: Abnormal   Collection Time: 12/07/19  1:37 PM   Specimen: Nasopharyngeal Swab  Result Value  Ref Range Status   SARS Coronavirus 2 by RT PCR POSITIVE (A) NEGATIVE Final    Comment: RESULT CALLED TO, READ BACK BY AND VERIFIED WITH: AMBER REDAMCE @ 1516 ON 12/07/2019 BY CAF (NOTE) SARS-CoV-2 target nucleic acids are DETECTED.  SARS-CoV-2 RNA is generally detectable in upper respiratory specimens  during the acute phase of infection. Positive results are indicative of the presence of the identified virus, but do not rule out bacterial infection or co-infection with other pathogens not detected by the test. Clinical correlation with patient history and other diagnostic information is necessary to determine patient infection status. The expected result is Negative.  Fact Sheet for Patients:  https://www.moore.com/  Fact Sheet for Healthcare Providers: https://www.young.biz/  This test is not yet approved or cleared by the [place] FDA and  has been authorized for detection and/or  diagnosis of SARS-CoV-2 by FDA under an Emergency Use Authorization (EUA).  This EUA will remain in effect (meaning this test  can be used) for the duration of  the COVID-19 declaration under Section 564(b)(1) of the Act, 21 U.S.C. section 360bbb-3(b)(1), unless the authorization is terminated or revoked sooner.      Influenza A by PCR NEGATIVE NEGATIVE Final   Influenza B by PCR NEGATIVE NEGATIVE Final    Comment: (NOTE) The Xpert Xpress SARS-CoV-2/FLU/RSV assay is intended as an aid in  the diagnosis of influenza from Nasopharyngeal swab specimens and  should not be used as a sole basis for treatment. Nasal washings and  aspirates are unacceptable for Xpert Xpress SARS-CoV-2/FLU/RSV  testing.  Fact Sheet for Patients: https://www.moore.com/  Fact Sheet for Healthcare Providers: https://www.young.biz/  This test is not yet approved or cleared by the [place] FDA and  has been authorized for detection and/or diagnosis of SARS-CoV-2 by  FDA under an Emergency Use Authorization (EUA). This EUA will remain  in effect (meaning this test can be used) for the duration of the  Covid-19 declaration under Section 564(b)(1) of the Act, 21  U.S.C. section 360bbb-3(b)(1), unless the authorization is  terminated or revoked. Performed at [facility] [address] [place], [place] [address]      Studies: DG Chest 1 View  Result Date: 12/07/2019 CLINICAL DATA:  Status post fall x2 today. EXAM: CHEST  1 VIEW COMPARISON:  None. FINDINGS: Pacing device in place. Heart size is normal. Lungs clear. No pneumothorax or pleural fluid. No acute or focal bony abnormality. IMPRESSION: No acute disease. Electronically Signed   By: Drusilla Kanner M.D.   On: 12/07/2019 14:57   DG Pelvis 1-2 Views  Result Date: 12/07/2019 CLINICAL DATA:  Pelvic pain after a fall today.  Initial encounter. EXAM: PELVIS - 1-2 VIEW COMPARISON:  None. FINDINGS: There  is no evidence of pelvic fracture or diastasis. No pelvic bone lesions are seen. Lower lumbar degenerative change and mild to moderate osteoarthritis about the hips noted. IMPRESSION: No acute abnormality. Electronically Signed   By: Drusilla Kanner M.D.   On: 12/07/2019 14:59   DG Knee 2 Views Left  Result Date: 12/07/2019 CLINICAL DATA:  Status post fall today.  Right knee pain. EXAM: LEFT KNEE - 1-2 VIEW COMPARISON:  None. FINDINGS: No acute bony or joint abnormality. Mild to moderate degenerative change about the knee noted. No joint effusion is seen. No chondrocalcinosis. IMPRESSION: No acute abnormality. Mild to moderate appearing osteoarthritis. Electronically Signed   By: Drusilla Kanner M.D.   On: 12/07/2019 14:57   DG Knee 2 Views Right  Result Date: 12/07/2019 CLINICAL DATA:  Status post fall today.  Initial encounter. EXAM: RIGHT KNEE - 1-2 VIEW COMPARISON:  None. FINDINGS: There is no acute bony or joint abnormality. Tricompartmental degenerative disease is worst medially. No joint effusion or chondrocalcinosis. IMPRESSION: No acute abnormality. 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 Electronically Signed   By: Drusilla Kanner M.D.   On: 12/07/2019 14:58   CT Head Wo Contrast  Result Date: 12/07/2019 CLINICAL DATA:  Mental status change.  Falls.  Weakness. EXAM: CT HEAD WITHOUT CONTRAST TECHNIQUE: Contiguous axial images were obtained from the base of the skull through the vertex without intravenous contrast. COMPARISON:  None. FINDINGS: Brain: No evidence of acute large vascular territory infarction, hemorrhage, hydrocephalus, extra-axial collection or mass lesion/mass effect. Patchy white matter hypoattenuation, which is nonspecific, but most likely secondary to chronic microvascular ischemic disease. Mild diffuse cerebral volume loss. Vascular: Calcific intracranial atherosclerosis. Skull: No acute fracture. Sinuses/Orbits: Mild right sphenoid sinus mucosal  thickening. Otherwise, the sinuses are clear. No acute orbital abnormality. Other: No mastoid effusion. IMPRESSION: 1. No evidence of acute intracranial abnormality. 2. 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 Electronically Signed   By: Feliberto Harts MD   On: 12/07/2019 14:29   CT ANGIO CHEST PE W OR WO CONTRAST  Result Date: 12/07/2019 CLINICAL DATA:  COVID pneumonia, weakness EXAM: CT ANGIOGRAPHY CHEST WITH CONTRAST TECHNIQUE: Multidetector CT imaging of the chest was performed using the standard protocol during bolus administration of intravenous contrast. Multiplanar CT image reconstructions and MIPs were obtained to evaluate the vascular anatomy. CONTRAST:  OMNIPAQUE IOHEXOL 350 MG/ML SOLN COMPARISON:  Chest radiograph dated 12/07/2019 FINDINGS: Cardiovascular: Satisfactory opacification the bilateral pulmonary arteries to the segmental level. Evaluation of the bilateral lower lobes is mildly constrained by respiratory motion. Within that constraint, there is no evidence of pulmonary embolism. Although not tailored for evaluation of the thoracic aorta, there is no evidence of thoracic aortic aneurysm or dissection. Mild cardiomegaly. No pericardial effusion. Left subclavian pacemaker. Coronary atherosclerosis of the LAD and right coronary artery. Mediastinum/Nodes: No suspicious mediastinal lymphadenopathy. Visualized thyroid is unremarkable. Lungs/Pleura: Evaluation of the lung parenchyma is constrained by respiratory motion. Within that constraint, there are no suspicious pulmonary nodules. Scattered lung cysts. Mild linear/patchy left lower lobe opacity (series 6/image 60), possibly reflecting pneumonia in this patient with known COVID. Mild linear atelectasis in the medial right middle lobe. No pleural effusion or pneumothorax. Upper Abdomen: Visualized upper abdomen is notable for numerous layering gallstones (series 4/image 88), without associated inflammatory changes. Mild vascular  calcifications. Musculoskeletal: Degenerative changes of the visualized thoracolumbar spine. No rib fracture is seen. Review of the MIP images confirms the above findings. IMPRESSION: No evidence of pulmonary embolism. Possible mild left lower lobe pneumonia in this patient with known COVID. Cholelithiasis, without associated inflammatory changes. Aortic Atherosclerosis (ICD10-I70.0). Electronically Signed   By: Charline Bills M.D.   On: 12/07/2019 18:45    Scheduled Meds: . atorvastatin  20 mg Oral Daily  . baricitinib  4 mg Oral Daily  . enoxaparin (LOVENOX) injection  40 mg Subcutaneous Q24H  . insulin aspart  0-15 Units Subcutaneous TID WC  . insulin aspart  0-5 Units Subcutaneous QHS  . insulin aspart  3 Units Subcutaneous TID WC  . insulin glargine  20 Units Subcutaneous BID  . levETIRAcetam  750 mg Oral BID  . methylPREDNISolone (SOLU-MEDROL) injection  60 mg Intravenous Q12H   Followed by  . [START ON 12/11/2019] predniSONE  50 mg Oral  Daily  . metoprolol succinate  25 mg Oral Daily  . sacubitril-valsartan  1 tablet Oral BID  . senna  1 tablet Oral BID   Continuous Infusions: . sodium chloride    . remdesivir 100 mg in NS 100 mL 100 mg (12/08/19 0905)    Assessment/Plan:  1. Acute hypoxic respiratory failure secondary to COVID-19 pneumonia.  Pulse ox 87 to 88% on room air.  Currently on 2 L of oxygen.  Day 2 of remdesivir.  Continue IV Solu-Medrol.  Add baricitinib with CRP being high. 2. Chronic diastolic congestive heart failure on Entresto, Toprol XL. 3. Lower extremity weeping, chronic lymphedema.  Will start low-dose Lasix.  Wound care to change his wraps Monday Wednesday and Friday. 4. Type 2 diabetes mellitus with hyperlipidemia.  Continue atorvastatin restart Lantus insulin sliding scale and short acting insulin prior to meals. 5. Morbid obesity.  Needed patient weight. 6. COPD 7. Weakness.  Physical therapy recommending rehab.  Transitional care team consult.      Code Status:     Code Status Orders  (From admission, onward)         Start     Ordered   12/07/19 1734  Full code  Continuous        12/07/19 1742        Code Status History    This patient has a current code status but no historical code status.   Advance Care Planning Activity     Family Communication: Spoke with roommate Disposition Plan: Status is: Inpatient  Dispo: The patient is from: Home              Anticipated d/c is to: Rehab              Anticipated d/c date is: 12/11/2019              Patient currently being treated for acute hypoxic respiratory failure and COVID-19 pneumonia.  Time spent: 28 minutes  Laquinn Shippy [employer]  [facility]

## 2019-12-09 DIAGNOSIS — Z6841 Body Mass Index (BMI) 40.0 and over, adult: Secondary | ICD-10-CM

## 2019-12-09 DIAGNOSIS — J1282 Pneumonia due to coronavirus disease 2019: Secondary | ICD-10-CM

## 2019-12-09 LAB — COMPREHENSIVE METABOLIC PANEL
ALT: 19 U/L (ref 0–44)
AST: 34 U/L (ref 15–41)
Albumin: 2.8 g/dL — ABNORMAL LOW (ref 3.5–5.0)
Alkaline Phosphatase: 42 U/L (ref 38–126)
Anion gap: 8 (ref 5–15)
BUN: 23 mg/dL (ref 8–23)
CO2: 31 mmol/L (ref 22–32)
Calcium: 8.5 mg/dL — ABNORMAL LOW (ref 8.9–10.3)
Chloride: 102 mmol/L (ref 98–111)
Creatinine, Ser: 0.8 mg/dL (ref 0.44–1.00)
GFR calc Af Amer: >60 mL/min (ref >60)
GFR calc non Af Amer: >60 mL/min (ref >60)
Glucose, Bld: 289 mg/dL — ABNORMAL HIGH (ref 70–99)
Potassium: 4.2 mmol/L (ref 3.5–5.1)
Sodium: 141 mmol/L (ref 135–145)
Total Bilirubin: 0.5 mg/dL (ref 0.3–1.2)
Total Protein: 6.8 g/dL (ref 6.5–8.1)

## 2019-12-09 LAB — GLUCOSE, CAPILLARY
Glucose-Capillary: 277 mg/dL — ABNORMAL HIGH (ref 70–99)
Glucose-Capillary: 353 mg/dL — ABNORMAL HIGH (ref 70–99)
Glucose-Capillary: 288 mg/dL — ABNORMAL HIGH (ref 70–99)
Glucose-Capillary: 277 mg/dL — ABNORMAL HIGH (ref 70–99)

## 2019-12-09 LAB — FIBRIN DERIVATIVES D-DIMER (ARMC ONLY): Fibrin derivatives D-dimer (ARMC): 1427.6 ng/mL (FEU) — ABNORMAL HIGH (ref 0.00–499.00)

## 2019-12-09 LAB — C-REACTIVE PROTEIN: CRP: 13.1 mg/dL — ABNORMAL HIGH (ref <1.0)

## 2019-12-09 MED ORDER — INSULIN GLARGINE 100 UNIT/ML ~~LOC~~ SOLN
25.0000 [IU] | Freq: Two times a day (BID) | SUBCUTANEOUS | Status: DC
  Administered 2019-12-09 – 2019-12-12 (×6): 25 [IU] via SUBCUTANEOUS
  Filled 2019-12-09 (×7): qty 0.25

## 2019-12-09 MED ORDER — INSULIN ASPART 100 UNIT/ML ~~LOC~~ SOLN
10.0000 [IU] | Freq: Three times a day (TID) | SUBCUTANEOUS | Status: DC
  Administered 2019-12-09 – 2019-12-12 (×8): 10 [IU] via SUBCUTANEOUS
  Filled 2019-12-09 (×8): qty 1

## 2019-12-09 MED ORDER — ENOXAPARIN SODIUM 40 MG/0.4ML ~~LOC~~ SOLN
40.0000 mg | Freq: Two times a day (BID) | SUBCUTANEOUS | Status: DC
  Administered 2019-12-09 – 2019-12-10 (×2): 40 mg via SUBCUTANEOUS
  Filled 2019-12-09 (×2): qty 0.4

## 2019-12-09 NOTE — Progress Notes (Signed)
Inpatient Diabetes Program Recommendations  AACE/ADA: New Consensus Statement on Inpatient Glycemic Control   Target Ranges:  Prepandial:   less than 140 mg/dL      Peak postprandial:   less than 180 mg/dL (1-2 hours)      Critically ill patients:  140 - 180 mg/dL   Results for KAI, RAILSBACK (MRN [id number]) as of 12/09/2019 09:54  Ref. Range 12/08/2019 07:57 12/08/2019 11:46 12/08/2019 16:34 12/08/2019 23:11 12/09/2019 07:33  Glucose-Capillary Latest Ref Range: 70 - 99 mg/dL 469 (H)  Novolog 15 units 325 (H)  Novolog 14 units  Lantus 20 units 315 (H)  Novolog 14 units 304 (H)  Novolog 4 units  Lantus 20 units 277 (H)  Novolog 11 units  Results for SONITA, MICHIELS (MRN [id number]) as of 12/09/2019 09:54  Ref. Range 12/08/2019 04:22  Hemoglobin A1C Latest Ref Range: 4.8 - 5.6 % 7.6 (H)   Review of Glycemic Control  Diabetes history: DM2 Outpatient Diabetes medications: Lantus 20 units BID, Humalog 10 units TID with meals Current orders for Inpatient glycemic control: Lantus 20 units BID, Novolog 0-15 units TID with meals, Novolog 0-5 units QHS, Novolog 3 units TID with meals; Solumedrol 60 mg Q12H  Inpatient Diabetes Program Recommendations:    Insulin: If steroids are continued as ordered, please consider increasing Lantus to 25 units BID and meal coverage to Novolog 10 units TID with meals.  Thanks, Orlando Penner, RN, MSN, CDE Diabetes Coordinator Inpatient Diabetes Program [phone] (Team Pager from 8am to 5pm)

## 2019-12-09 NOTE — Progress Notes (Signed)
Anticoagulation monitoring(Lovenox):  74yo  female ordered Lovenox 40 mg Q24h for DVT prophylaxis  Filed Weights   12/08/19 1149  Weight: (!) 142 kg (313 lb 0.9 oz)   BMI 57.26   Lab Results  Component Value Date   CREATININE 0.80 12/09/2019   CREATININE 0.72 12/08/2019   CREATININE 0.85 12/07/2019   Estimated Creatinine Clearance: 84.6 mL/min (by C-G formula based on SCr of 0.8 mg/dL). Hemoglobin & Hematocrit     Component Value Date/Time   HGB 12.4 12/07/2019 1340   HCT 38.8 12/07/2019 1340     Per Protocol for Patient with estCrcl > 30 ml/min and BMI > 40, will transition to Lovenox 40 mg Q12h.     Clovia Cuff, PharmD, BCPS 12/09/2019 3:28 PM

## 2019-12-09 NOTE — [place] FL2 (Signed)
[place] MEDICAID FL2 LEVEL OF CARE SCREENING TOOL     IDENTIFICATION  Patient Name: Grace Short. Sperry Birthdate: Oct 08, 1945 Sex: female Admission Date (Current Location): 12/07/2019  [place] and [place] Number:  Chiropodist and Address:  [facility], [address] [place], [place] [address]      Provider Number: [id number]  Attending Physician Name and Address:  Alford Highland, MD  Relative Name and Phone Number:  Threasa Beards (friend) [phone]    Current Level of Care: Hospital Recommended Level of Care: Skilled Nursing Facility Prior Approval Number:    Date Approved/Denied:   PASRR Number: [id number]  Discharge Plan: SNF    Current Diagnoses: Patient Active Problem List   Diagnosis Date Noted  . Lymphedema   . Type 2 diabetes mellitus with hyperlipidemia (HCC)   . Morbid obesity (HCC)   . Weakness   . Chronic heart failure with preserved ejection fraction (HFpEF) (HCC) 12/07/2019  . Edema of both lower extremities due to peripheral venous insufficiency 12/07/2019  . Stasis edema with ulcer and inflammation, bilateral (HCC) 12/07/2019  . SSS (sick sinus syndrome) (HCC) 12/07/2019  . COVID-19 virus infection 12/07/2019  . HTN (hypertension) 12/07/2019  . COPD (chronic obstructive pulmonary disease) (HCC) 12/07/2019  . Acute respiratory failure with hypoxemia (HCC) 12/07/2019    Orientation RESPIRATION BLADDER Height & Weight     Self, Time, Situation, Place  O2 (2L [place]) Continent Weight: (!) 142 kg Height:  5\' 2"  (157.5 cm)  BEHAVIORAL SYMPTOMS/MOOD NEUROLOGICAL BOWEL NUTRITION STATUS      Continent Diet (Heart Healthy Carb modified)  AMBULATORY STATUS COMMUNICATION OF NEEDS Skin   Limited Assist Verbally Normal                       Personal Care Assistance Level of Assistance  Bathing, Feeding, Dressing Bathing Assistance: Limited assistance Feeding assistance: Limited assistance Dressing Assistance:  Limited assistance     Functional Limitations Info             SPECIAL CARE FACTORS FREQUENCY  PT (By licensed PT), OT (By licensed OT)     PT Frequency: 5 times per week OT Frequency: 5 times per week            Contractures Contractures Info: Not present    Additional Factors Info  Code Status, Allergies, Isolation Precautions Code Status Info: Full Allergies Info: NKA     Isolation Precautions Info: COVID 19     Current Medications (12/09/2019):  This is the current hospital active medication list Current Facility-Administered Medications  Medication Dose Route Frequency Provider Last Rate Last Admin  . 0.9 %  sodium chloride infusion   Intravenous PRN 02/08/2020, MD      . acetaminophen (TYLENOL) tablet 650 mg  650 mg Oral Q6H PRN Delton Prairie, MD   650 mg at 12/08/19 0058  . albuterol (VENTOLIN HFA) 108 (90 Base) MCG/ACT inhaler 2 puff  2 puff Inhalation Once PRN 02/07/20, MD      . atorvastatin (LIPITOR) tablet 20 mg  20 mg Oral Daily Norins, Delton Prairie, MD   20 mg at 12/09/19 0810  . baricitinib (OLUMIANT) tablet 4 mg  4 mg Oral Daily 02/08/20, MD   4 mg at 12/09/19 0809  . chlorpheniramine-HYDROcodone (TUSSIONEX) 10-8 MG/5ML suspension 5 mL  5 mL Oral Q12H PRN Norins, 02/08/20, MD   5 mL at 12/08/19 2109  . enoxaparin (LOVENOX) injection 40 mg  40 mg Subcutaneous Q24H Jacques Navy, MD   40 mg at 12/08/19 2109  . EPINEPHrine (EPI-PEN) injection 0.3 mg  0.3 mg Intramuscular Once PRN Delton Prairie, MD      . furosemide (LASIX) tablet 20 mg  20 mg Oral BID Alford Highland, MD   20 mg at 12/09/19 0810  . guaiFENesin-dextromethorphan (ROBITUSSIN DM) 100-10 MG/5ML syrup 10 mL  10 mL Oral Q4H PRN Norins, Rosalyn Gess, MD      . insulin aspart (novoLOG) injection 0-15 Units  0-15 Units Subcutaneous TID WC Alford Highland, MD   15 Units at 12/09/19 1220  . insulin aspart (novoLOG) injection 0-5 Units  0-5 Units Subcutaneous QHS Alford Highland, MD   4  Units at 12/08/19 2200  . insulin aspart (novoLOG) injection 3 Units  3 Units Subcutaneous TID WC Alford Highland, MD   3 Units at 12/09/19 1221  . insulin glargine (LANTUS) injection 20 Units  20 Units Subcutaneous BID Alford Highland, MD   20 Units at 12/09/19 1220  . levETIRAcetam (KEPPRA) tablet 750 mg  750 mg Oral BID Jacques Navy, MD   750 mg at 12/09/19 1219  . methylPREDNISolone sodium succinate (SOLU-MEDROL) 125 mg/2 mL injection 60 mg  60 mg Intravenous Q12H Norins, Rosalyn Gess, MD   60 mg at 12/09/19 1221   Followed by  . [START ON 12/11/2019] predniSONE (DELTASONE) tablet 50 mg  50 mg Oral Daily Norins, Rosalyn Gess, MD      . metoprolol succinate (TOPROL-XL) 24 hr tablet 25 mg  25 mg Oral Daily Norins, Rosalyn Gess, MD   25 mg at 12/09/19 1220  . remdesivir 100 mg in sodium chloride 0.9 % 100 mL IVPB  100 mg Intravenous Daily Norins, Rosalyn Gess, MD 200 mL/hr at 12/09/19 0813 100 mg at 12/09/19 0813  . sacubitril-valsartan (ENTRESTO) 49-51 mg per tablet  1 tablet Oral BID Norins, Rosalyn Gess, MD   1 tablet at 12/09/19 1220  . senna (SENOKOT) tablet 8.6 mg  1 tablet Oral BID Norins, Rosalyn Gess, MD   8.6 mg at 12/09/19 2376     Discharge Medications: Please see discharge summary for a list of discharge medications.  Relevant Imaging Results:  Relevant Lab Results:   Additional Information SS# [id number]  Allayne Butcher, RN

## 2019-12-09 NOTE — Progress Notes (Signed)
Patient ID: Grace Short, female   DOB: [birth year], 74 y.o.   MRN: [id number] [facility] PROGRESS NOTE  Kobi V. Cheever CLE:[id number] DOB: Sep 12, 1945 DOA: 12/07/2019 PCP: Shane Crutch, PA  HPI/Subjective: Patient feels little bit better.  Still weak.  Still with some cough and some shortness of breath and occasional wheeze.  Admitted with COVID-19 pneumonia.  Objective: Vitals:   12/09/19 0733 12/09/19 1156  BP: 129/67 (!) 153/67  Pulse: 68 75  Resp: 18 18  Temp: 98.8 F (37.1 C) 98.8 F (37.1 C)  SpO2: 96% 97%    Intake/Output Summary (Last 24 hours) at 12/09/2019 1345 Last data filed at 12/08/2019 2100 Gross per 24 hour  Intake 340 ml  Output --  Net 340 ml   Filed Weights   12/08/19 1149  Weight: (!) 142 kg    ROS: Review of Systems  Respiratory: Positive for cough, shortness of breath and wheezing.   Cardiovascular: Negative for chest pain.  Gastrointestinal: Negative for abdominal pain, nausea and vomiting.   Exam: Physical Exam HENT:     Mouth/Throat:     Pharynx: No oropharyngeal exudate.  Eyes:     General: Lids are normal.     Conjunctiva/sclera: Conjunctivae normal.  Cardiovascular:     Rate and Rhythm: Normal rate and regular rhythm.     Heart sounds: Normal heart sounds, S1 normal and S2 normal.  Pulmonary:     Breath sounds: Examination of the right-lower field reveals decreased breath sounds. Examination of the left-lower field reveals decreased breath sounds. Decreased breath sounds present. No wheezing, rhonchi or rales.  Abdominal:     Palpations: Abdomen is soft.     Tenderness: There is no abdominal tenderness.  Musculoskeletal:     Right lower leg: Swelling present.     Left lower leg: Swelling present.  Skin:    General: Skin is warm.     Findings: No rash.     Comments: Lower extremities covered with wraps.  Neurological:     Mental Status: She is alert and oriented to person, place, and time.       Data Reviewed: Basic  Metabolic Panel: Recent Labs  Lab 12/07/19 1337 12/07/19 1340 12/08/19 0422 12/09/19 0453  NA  --  137 137 141  K  --  4.0 3.7 4.2  CL  --  98 99 102  CO2  --  27 28 31   GLUCOSE  --  255* 272* 289*  BUN  --  8 8 23   CREATININE  --  0.85 0.72 0.80  CALCIUM  --  8.4* 8.2* 8.5*  MG 1.8  --   --   --    Liver Function Tests: Recent Labs  Lab 12/07/19 1340 12/08/19 0422 12/09/19 0453  AST 32 43* 34  ALT 16 18 19   ALKPHOS 54 51 42  BILITOT 0.9 0.7 0.5  PROT 7.7 7.4 6.8  ALBUMIN 3.7 3.2* 2.8*   CBC: Recent Labs  Lab 12/07/19 1340  WBC 6.8  NEUTROABS 5.7  HGB 12.4  HCT 38.8  MCV 80.2  PLT 175   BNP (last 3 results) Recent Labs    12/07/19 1337  BNP 152.7*    CBG: Recent Labs  Lab 12/08/19 1146 12/08/19 1634 12/08/19 2311 12/09/19 0733 12/09/19 1153  GLUCAP 325* 315* 304* 277* 353*    Recent Results (from the past 240 hour(s))  Respiratory Panel by RT PCR (Flu A&B, Covid) - Nasopharyngeal Swab     Status: Abnormal  Collection Time: 12/07/19  1:37 PM   Specimen: Nasopharyngeal Swab  Result Value Ref Range Status   SARS Coronavirus 2 by RT PCR POSITIVE (A) NEGATIVE Final    Comment: RESULT CALLED TO, READ BACK BY AND VERIFIED WITH: AMBER REDAMCE @ 1516 ON 12/07/2019 BY [facility] (NOTE) SARS-CoV-2 target nucleic acids are DETECTED.  SARS-CoV-2 RNA is generally detectable in upper respiratory specimens  during the acute phase of infection. Positive results are indicative of the presence of the identified virus, but do not rule out bacterial infection or co-infection with other pathogens not detected by the test. Clinical correlation with patient history and other diagnostic information is necessary to determine patient infection status. The expected result is Negative.  Fact Sheet for Patients:  https://www.moore.com/https://www.fda.gov/media/142436/download  Fact Sheet for Healthcare Providers: https://www.young.biz/https://www.fda.gov/media/142435/download  This test is not yet approved or  cleared by the [place] FDA and  has been authorized for detection and/or diagnosis of SARS-CoV-2 by FDA under an Emergency Use Authorization (EUA).  This EUA will remain in effect (meaning this test  can be used) for the duration of  the COVID-19 declaration under Section 564(b)(1) of the Act, 21 U.S.C. section 360bbb-3(b)(1), unless the authorization is terminated or revoked sooner.      Influenza A by PCR NEGATIVE NEGATIVE Final   Influenza B by PCR NEGATIVE NEGATIVE Final    Comment: (NOTE) The Xpert Xpress SARS-CoV-2/FLU/RSV assay is intended as an aid in  the diagnosis of influenza from Nasopharyngeal swab specimens and  should not be used as a sole basis for treatment. Nasal washings and  aspirates are unacceptable for Xpert Xpress SARS-CoV-2/FLU/RSV  testing.  Fact Sheet for Patients: https://www.moore.com/https://www.fda.gov/media/142436/download  Fact Sheet for Healthcare Providers: https://www.young.biz/https://www.fda.gov/media/142435/download  This test is not yet approved or cleared by the [place] FDA and  has been authorized for detection and/or diagnosis of SARS-CoV-2 by  FDA under an Emergency Use Authorization (EUA). This EUA will remain  in effect (meaning this test can be used) for the duration of the  Covid-19 declaration under Section 564(b)(1) of the Act, 21  U.S.C. section 360bbb-3(b)(1), unless the authorization is  terminated or revoked. Performed at [facility], [address] [place], [place] [address]      Studies: DG Chest 1 View  Result Date: 12/07/2019 CLINICAL DATA:  Status post fall x2 today. EXAM: CHEST  1 VIEW COMPARISON:  None. FINDINGS: Pacing device in place. Heart size is normal. Lungs clear. No pneumothorax or pleural fluid. No acute or focal bony abnormality. IMPRESSION: No acute disease. Electronically Signed   By: Drusilla Kannerhomas  Dalessio M.D.   On: 12/07/2019 14:57   DG Pelvis 1-2 Views  Result Date: 12/07/2019 CLINICAL DATA:  Pelvic pain after a fall  today.  Initial encounter. EXAM: PELVIS - 1-2 VIEW COMPARISON:  None. FINDINGS: There is no evidence of pelvic fracture or diastasis. No pelvic bone lesions are seen. Lower lumbar degenerative change and mild to moderate osteoarthritis about the hips noted. IMPRESSION: No acute abnormality. Electronically Signed   By: Drusilla Kannerhomas  Dalessio M.D.   On: 12/07/2019 14:59   DG Knee 2 Views Left  Result Date: 12/07/2019 CLINICAL DATA:  Status post fall today.  Right knee pain. EXAM: LEFT KNEE - 1-2 VIEW COMPARISON:  None. FINDINGS: No acute bony or joint abnormality. Mild to moderate degenerative change about the knee noted. No joint effusion is seen. No chondrocalcinosis. IMPRESSION: No acute abnormality. Mild to moderate appearing osteoarthritis. Electronically Signed   By: Drusilla Kannerhomas  Dalessio  M.D.   On: 12/07/2019 14:57   DG Knee 2 Views Right  Result Date: 12/07/2019 CLINICAL DATA:  Status post fall today.  Initial encounter. EXAM: RIGHT KNEE - 1-2 VIEW COMPARISON:  None. FINDINGS: There is no acute bony or joint abnormality. Tricompartmental degenerative disease is worst medially. No joint effusion or chondrocalcinosis. IMPRESSION: No acute abnormality. 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 Electronically Signed   By: Drusilla Kanner M.D.   On: 12/07/2019 14:58   CT Head Wo Contrast  Result Date: 12/07/2019 CLINICAL DATA:  Mental status change.  Falls.  Weakness. EXAM: CT HEAD WITHOUT CONTRAST TECHNIQUE: Contiguous axial images were obtained from the base of the skull through the vertex without intravenous contrast. COMPARISON:  None. FINDINGS: Brain: No evidence of acute large vascular territory infarction, hemorrhage, hydrocephalus, extra-axial collection or mass lesion/mass effect. Patchy white matter hypoattenuation, which is nonspecific, but most likely secondary to chronic microvascular ischemic disease. Mild diffuse cerebral volume loss. Vascular: Calcific intracranial  atherosclerosis. Skull: No acute fracture. Sinuses/Orbits: Mild right sphenoid sinus mucosal thickening. Otherwise, the sinuses are clear. No acute orbital abnormality. Other: No mastoid effusion. IMPRESSION: 1. No evidence of acute intracranial abnormality. 2. 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 Electronically Signed   By: Feliberto Harts MD   On: 12/07/2019 14:29   CT ANGIO CHEST PE W OR WO CONTRAST  Result Date: 12/07/2019 CLINICAL DATA:  COVID pneumonia, weakness EXAM: CT ANGIOGRAPHY CHEST WITH CONTRAST TECHNIQUE: Multidetector CT imaging of the chest was performed using the standard protocol during bolus administration of intravenous contrast. Multiplanar CT image reconstructions and MIPs were obtained to evaluate the vascular anatomy. CONTRAST:  OMNIPAQUE IOHEXOL 350 MG/ML SOLN COMPARISON:  Chest radiograph dated 12/07/2019 FINDINGS: Cardiovascular: Satisfactory opacification the bilateral pulmonary arteries to the segmental level. Evaluation of the bilateral lower lobes is mildly constrained by respiratory motion. Within that constraint, there is no evidence of pulmonary embolism. Although not tailored for evaluation of the thoracic aorta, there is no evidence of thoracic aortic aneurysm or dissection. Mild cardiomegaly. No pericardial effusion. Left subclavian pacemaker. Coronary atherosclerosis of the LAD and right coronary artery. Mediastinum/Nodes: No suspicious mediastinal lymphadenopathy. Visualized thyroid is unremarkable. Lungs/Pleura: Evaluation of the lung parenchyma is constrained by respiratory motion. Within that constraint, there are no suspicious pulmonary nodules. Scattered lung cysts. Mild linear/patchy left lower lobe opacity (series 6/image 60), possibly reflecting pneumonia in this patient with known COVID. Mild linear atelectasis in the medial right middle lobe. No pleural effusion or pneumothorax. Upper Abdomen: Visualized upper abdomen is notable for numerous  layering gallstones (series 4/image 88), without associated inflammatory changes. Mild vascular calcifications. Musculoskeletal: Degenerative changes of the visualized thoracolumbar spine. No rib fracture is seen. Review of the MIP images confirms the above findings. IMPRESSION: No evidence of pulmonary embolism. Possible mild left lower lobe pneumonia in this patient with known COVID. Cholelithiasis, without associated inflammatory changes. Aortic Atherosclerosis (ICD10-I70.0). Electronically Signed   By: Charline Bills M.D.   On: 12/07/2019 18:45    Scheduled Meds:  atorvastatin  20 mg Oral Daily   baricitinib  4 mg Oral Daily   enoxaparin (LOVENOX) injection  40 mg Subcutaneous Q24H   furosemide  20 mg Oral BID   insulin aspart  0-15 Units Subcutaneous TID WC   insulin aspart  0-5 Units Subcutaneous QHS   insulin aspart  10 Units Subcutaneous TID WC   insulin glargine  25 Units Subcutaneous BID   levETIRAcetam  750 mg Oral BID  methylPREDNISolone (SOLU-MEDROL) injection  60 mg Intravenous Q12H   Followed by   Melene Muller ON 12/11/2019] predniSONE  50 mg Oral Daily   metoprolol succinate  25 mg Oral Daily   sacubitril-valsartan  1 tablet Oral BID   senna  1 tablet Oral BID   Continuous Infusions:  sodium chloride     remdesivir 100 mg in NS 100 mL 100 mg (12/09/19 0813)    Assessment/Plan:  1. Acute hypoxic respiratory failure secondary to COVID-19 pneumonia.  Initial pulse ox 87 to 88% on room air.  Try to taper from 2 L of oxygen.  The patient did have oxygen in the past but not currently.  Continue day 3 of remdesivir.  Continue IV Solu-Medrol.  Continue baricitinib with CRP being high. 2. Chronic diastolic congestive heart failure on Entresto and Toprol-XL.  Add low-dose Lasix for lower extremity edema. 3. Lower extremity weeping edema and chronic lymphedema.  Continue low-dose Lasix.  Wound care evaluation to change wraps 4. Type 2 diabetes mellitus with  hyperlipidemia.  Increase Lantus insulin to 25 units twice daily and increase short acting insulin 10 units 3 times daily.  Continue atorvastatin. 5. Morbid obesity with a BMI of 57.26 6. COPD 7. Weakness.  Physical therapy recommends rehab.     Code Status:     Code Status Orders  (From admission, onward)         Start     Ordered   12/07/19 1734  Full code  Continuous        12/07/19 1742        Code Status History    This patient has a current code status but no historical code status.   Advance Care Planning Activity     Family Communication: Updated roommate on the phone Disposition Plan: Status is: Inpatient  Dispo: The patient is from: Home              Anticipated d/c is to: Rehab              Anticipated d/c date is: 12/11/2019              Patient currently being treated with steroids IV, IV remdesivir and baricitinib for COVID-19 pneumonia and acute hypoxic respiratory failure  Time spent: 28 minutes  Zaryia Markel [employer]

## 2019-12-09 NOTE — TOC Initial Note (Signed)
Transition of Care (TOC) - Initial/Assessment Note    Patient Details  Name: Grace Short. Blankenbaker MRN: [id number] Date of Birth: 28-Mar-1945  Transition of Care [facility]) CM/SW Contact:    Allayne Butcher, RN Phone Number: 12/09/2019, 12:36 PM  Clinical Narrative:                 Patient admitted to the hospital with COVID requiring acute O2 at 2L.  Patient reports that she does not wear oxygen at home.  RNCM was able to speak with patient via phone.  Patient is from home where she lives with a roommate, Katrina.  She and Katrina have been living together for some time and Katrina has 2 sons that live with them also.  Patient is able to complete ADL's independently and uses a walker.  Katrina provides transportation patient does not drive.    PT has recommended SNF and patient agrees.  Patient is good with [facility] if they can offer a bed.  Bed request sent to [facility].   TOC team will cont to follow.   Expected Discharge Plan: Skilled Nursing Facility Barriers to Discharge: Continued Medical Work up   Patient Goals and CMS Choice Patient states their goals for this hospitalization and ongoing recovery are:: Patient wants to get stronger and be able to walk CMS Medicare.gov Compare Post Acute Care list provided to:: Patient Choice offered to / list presented to : Patient  Expected Discharge Plan and Services Expected Discharge Plan: Skilled Nursing Facility   Discharge Planning Services: CM Consult Post Acute Care Choice: Skilled Nursing Facility Living arrangements for the past 2 months: Single Family Home                           HH Arranged: NA          Prior Living Arrangements/Services Living arrangements for the past 2 months: Single Family Home Lives with:: Roommate Patient language and need for interpreter reviewed:: Yes Do you feel safe going back to the place where you live?: Yes      Need for Family Participation in Patient Care: Yes (Comment) (COVID,  weakness) Care giver support system in place?: Yes (comment) (roommate) Current home services: DME (walker) Criminal Activity/Legal Involvement Pertinent to Current Situation/Hospitalization: No - Comment as needed  Activities of Daily Living Home Assistive Devices/Equipment: None ADL Screening (condition at time of admission) Patient's cognitive ability adequate to safely complete daily activities?: Yes Is the patient deaf or have difficulty hearing?: No Does the patient have difficulty seeing, even when wearing glasses/contacts?: No Does the patient have difficulty concentrating, remembering, or making decisions?: No Patient able to express need for assistance with ADLs?: No Does the patient have difficulty dressing or bathing?: Yes Independently performs ADLs?: No Does the patient have difficulty walking or climbing stairs?: Yes Weakness of Legs: Both Weakness of Arms/Hands: Both  Permission Sought/Granted Permission sought to share information with : Case Manager, Other (comment), Facility Medical sales representative Permission granted to share information with : Yes, Verbal Permission Granted  Share Information with NAME: Katrina  Permission granted to share info w AGENCY: [facility]  Permission granted to share info w Relationship: friend and roommate     Emotional Assessment   Attitude/Demeanor/Rapport: Engaged Affect (typically observed): Accepting Orientation: : Oriented to Self, Oriented to Place, Oriented to  Time, Oriented to Situation Alcohol / Substance Use: Not Applicable Psych Involvement: No (comment)  Admission diagnosis:  Weakness [R53.1] Generalized  weakness [R53.1] Acute respiratory failure with hypoxemia (HCC) [J96.01] COVID-19 [U07.1] Patient Active Problem List   Diagnosis Date Noted  . Lymphedema   . Type 2 diabetes mellitus with hyperlipidemia (HCC)   . Morbid obesity (HCC)   . Weakness   . Chronic heart failure with preserved ejection fraction  (HFpEF) (HCC) 12/07/2019  . Edema of both lower extremities due to peripheral venous insufficiency 12/07/2019  . Stasis edema with ulcer and inflammation, bilateral (HCC) 12/07/2019  . SSS (sick sinus syndrome) (HCC) 12/07/2019  . COVID-19 virus infection 12/07/2019  . HTN (hypertension) 12/07/2019  . COPD (chronic obstructive pulmonary disease) (HCC) 12/07/2019  . Acute respiratory failure with hypoxemia (HCC) 12/07/2019   PCP:  Shane Crutch, PA Pharmacy:   [facility] - [place], [place] - [address] [place] [address] Phone: [phone] Fax: [phone]     Social Determinants of Health (SDOH) Interventions    Readmission Risk Interventions No flowsheet data found.

## 2019-12-09 NOTE — Consult Note (Signed)
WOC Nurse Consult Note: Reason for Consult:Lumphedema with chronic venous insufficiency and diabetes.  No open wounds at this time.  Has not followed up at lymphedema clinic as recommended.  Informed that legs will open up and could lead to chronic nonhealing wounds if compression is not managed appropriately, usually up to mid thigh and that WOC team did not manage this.  I informed her I would apply the same compression that wound care clinic had initiated to minimize chance of re-ulceration, but that she would need specialized lymphedema management.  Wound type: inflammatory Pressure Injury POA:  NA Measurement:chronic skin changes, no open wounds today.  Scarring from previous open wounds to bilateral lower legs.  Left posterior lower leg and right medial lower leg scarring.  Hemosiderin staining present and generalized edema.  Wound JSH:FWYO Drainage (amount, consistency, odor) none Periwound:chronic skin changes to bilateral lower legs.  Dressing procedure/placement/frequency:Cleanse both legs with soap and water and apply moisturizing cream.  Wrap from below toes to below knee with kerlix, two layer (requries 2 rolls) and secure with self adherent coban. Change on Monday and Thursday.  Will follow.  Maple Hudson MSN, RN, FNP-BC CWON Wound, Ostomy, Continence Nurse Pager [phone]

## 2019-12-10 ENCOUNTER — Encounter: Payer: Self-pay | Admitting: Internal Medicine

## 2019-12-10 ENCOUNTER — Inpatient Hospital Stay: Payer: Medicare HMO

## 2019-12-10 DIAGNOSIS — R319 Hematuria, unspecified: Secondary | ICD-10-CM

## 2019-12-10 LAB — URINALYSIS, COMPLETE (UACMP) WITH MICROSCOPIC
RBC / HPF: >50 RBC/hpf — ABNORMAL HIGH (ref 0–5)
Specific Gravity, Urine: 1.028 (ref 1.005–1.030)
WBC, UA: NONE SEEN WBC/hpf (ref 0–5)

## 2019-12-10 LAB — COMPREHENSIVE METABOLIC PANEL
ALT: 17 U/L (ref 0–44)
AST: 27 U/L (ref 15–41)
Albumin: 2.7 g/dL — ABNORMAL LOW (ref 3.5–5.0)
Alkaline Phosphatase: 43 U/L (ref 38–126)
Anion gap: 8 (ref 5–15)
BUN: 23 mg/dL (ref 8–23)
CO2: 32 mmol/L (ref 22–32)
Calcium: 8.6 mg/dL — ABNORMAL LOW (ref 8.9–10.3)
Chloride: 102 mmol/L (ref 98–111)
Creatinine, Ser: 0.79 mg/dL (ref 0.44–1.00)
GFR calc Af Amer: >60 mL/min (ref >60)
GFR calc non Af Amer: >60 mL/min (ref >60)
Glucose, Bld: 249 mg/dL — ABNORMAL HIGH (ref 70–99)
Potassium: 3.8 mmol/L (ref 3.5–5.1)
Sodium: 142 mmol/L (ref 135–145)
Total Bilirubin: 0.4 mg/dL (ref 0.3–1.2)
Total Protein: 6.5 g/dL (ref 6.5–8.1)

## 2019-12-10 LAB — GLUCOSE, CAPILLARY
Glucose-Capillary: 252 mg/dL — ABNORMAL HIGH (ref 70–99)
Glucose-Capillary: 275 mg/dL — ABNORMAL HIGH (ref 70–99)
Glucose-Capillary: 204 mg/dL — ABNORMAL HIGH (ref 70–99)
Glucose-Capillary: 199 mg/dL — ABNORMAL HIGH (ref 70–99)

## 2019-12-10 LAB — FIBRIN DERIVATIVES D-DIMER (ARMC ONLY): Fibrin derivatives D-dimer (ARMC): 1073.83 ng/mL (FEU) — ABNORMAL HIGH (ref 0.00–499.00)

## 2019-12-10 LAB — C-REACTIVE PROTEIN: CRP: 7.7 mg/dL — ABNORMAL HIGH (ref <1.0)

## 2019-12-10 MED ORDER — LOPERAMIDE HCL 2 MG PO CAPS
4.0000 mg | ORAL_CAPSULE | Freq: Three times a day (TID) | ORAL | Status: DC | PRN
  Administered 2019-12-10: 4 mg via ORAL
  Filled 2019-12-10: qty 2

## 2019-12-10 NOTE — Progress Notes (Signed)
Dr Renae Gloss aware that pt urine dark/red with sediment and that pt started having some diarrhea yesterday, new orders placed

## 2019-12-10 NOTE — Progress Notes (Signed)
Physical Therapy Treatment Patient Details Name: Grace Short. Repetto MRN: [id number] DOB: Nov 03, 1945 Today's Date: 12/10/2019    History of Present Illness Patient is a 74 year old female admitted for evaluation of weakness with fall at home. Found to have Covid 19 with acute respiratory failure with hypoxemia  with history of COPD and home oxygen use. Increased confusion with baseline dementia. Chronic lymphedema with venous insufficiency related to HF with stasis ulcers with unna boot therapy. Patient is typically ambulatory with a walker at home.    PT Comments    Pt agrees to session.  Participated in exercises as described below. She is able to transition to EOB with heavy use of rails and encouragement but only min a for LE assist due to Coban friction on sheets.  She has difficulty getting fully upright at EOB given body habitus but is able to support herself and remain sitting 5 minutes before fatigue.  Returned to supine again with good effort and mod a x 1 for LE assist.  She is able to scoot up in supine with bed in Trend position with much effort and cues.     Follow Up Recommendations  SNF     Equipment Recommendations       Recommendations for Other Services       Precautions / Restrictions Precautions Precautions: Fall Restrictions Weight Bearing Restrictions: No    Mobility  Bed Mobility Overal bed mobility: Needs Assistance Bed Mobility: Supine to Sit;Sit to Supine     Supine to sit: Min assist Sit to supine: Mod assist   General bed mobility comments: good effort today with heavy use of rails but overall improved this session  Transfers                    Ambulation/Gait                 Stairs             Wheelchair Mobility    Modified Rankin (Stroke Patients Only)       Balance Overall balance assessment: Needs assistance Sitting-balance support: Feet supported;Single extremity supported Sitting balance-Leahy Scale:  Poor Sitting balance - Comments: body habitus limiting sitting and ability to gain complet balance.                                    Cognition   Behavior During Therapy: WFL for tasks assessed/performed Overall Cognitive Status: Within Functional Limits for tasks assessed                                        Exercises General Exercises - Lower Extremity Ankle Circles/Pumps: AAROM;Strengthening;Both;10 reps;Supine Heel Slides: AAROM;Strengthening;Both;10 reps;Supine Hip ABduction/ADduction: AAROM;Strengthening;Both;10 reps;Supine Straight Leg Raises: AAROM;Strengthening;Both;10 reps;Supine    General Comments        Pertinent Vitals/Pain Pain Assessment: Faces Faces Pain Scale: Hurts little more Pain Location: left leg  Pain Descriptors / Indicators: Sore Pain Intervention(s): Limited activity within patient's tolerance;Repositioned    Home Living                      Prior Function            PT Goals (current goals can now be found in the care plan section) Progress towards PT goals: Progressing toward  goals    Frequency    Min 2X/week      PT Plan Current plan remains appropriate    Co-evaluation              AM-PAC PT "6 Clicks" Mobility   Outcome Measure  Help needed turning from your back to your side while in a flat bed without using bedrails?: A Lot Help needed moving from lying on your back to sitting on the side of a flat bed without using bedrails?: Total Help needed moving to and from a bed to a chair (including a wheelchair)?: Total Help needed standing up from a chair using your arms (e.g., wheelchair or bedside chair)?: Total Help needed to walk in hospital room?: Total Help needed climbing 3-5 steps with a railing? : Total 6 Click Score: 7    End of Session Equipment Utilized During Treatment: Oxygen Activity Tolerance: Patient tolerated treatment well Patient left: in bed;with call  bell/phone within reach;with bed alarm set Nurse Communication: Mobility status       Time: 7867-6720 PT Time Calculation (min) (ACUTE ONLY): 25 min  Charges:  $Therapeutic Exercise: 8-22 mins $Therapeutic Activity: 8-22 mins                    Danielle Dess, PTA 12/10/19, 12:08 PM

## 2019-12-10 NOTE — Care Management Important Message (Signed)
Important Message  Patient Details  Name: Grace Short. Gal MRN: [id number] Date of Birth: [birth year]   Medicare Important Message Given:  Yes  Reviewed with patient via room phone due to isolation status. Declined copy of Medicare IM at this time as copy being sent to her house by [facility].    [name] 12/10/2019, 11:35 AM

## 2019-12-10 NOTE — Progress Notes (Signed)
Patient ID: Grace Short, female   DOB: 05/20/1945, 74 y.o.   MRN: [id number] [facility] PROGRESS NOTE  Grace Short ZLD:[id number] DOB: 08/13/1945 DOA: 12/07/2019 PCP: Shane Crutch, PA  HPI/Subjective: No complaints of burning on urination. Urine is dark and reddish in the container. No abdominal pain. Did have diarrhea. Still with cough and shortness of breath.  Objective: Vitals:   12/10/19 0750 12/10/19 1158  BP: (!) 153/70 (!) 160/68  Pulse: 69 66  Resp: 18 20  Temp: 98.7 F (37.1 C) 98.2 F (36.8 C)  SpO2: 94% 95%    Intake/Output Summary (Last 24 hours) at 12/10/2019 1328 Last data filed at 12/10/2019 0851 Gross per 24 hour  Intake --  Output 1400 ml  Net -1400 ml   Filed Weights   12/08/19 1149  Weight: (!) 142 kg    ROS: Review of Systems  Respiratory: Positive for cough and shortness of breath.   Cardiovascular: Negative for chest pain.  Gastrointestinal: Positive for diarrhea. Negative for abdominal pain, nausea and vomiting.  Genitourinary: Positive for hematuria.   Exam: Physical Exam HENT:     Mouth/Throat:     Pharynx: No oropharyngeal exudate.  Eyes:     General: Lids are normal.     Conjunctiva/sclera: Conjunctivae normal.  Cardiovascular:     Rate and Rhythm: Normal rate and regular rhythm.     Heart sounds: Normal heart sounds, S1 normal and S2 normal.  Pulmonary:     Breath sounds: Examination of the right-lower field reveals decreased breath sounds and rhonchi. Examination of the left-lower field reveals decreased breath sounds and rhonchi. Decreased breath sounds and rhonchi present. No wheezing.  Abdominal:     Palpations: Abdomen is soft.     Tenderness: There is no abdominal tenderness.  Musculoskeletal:     Right lower leg: Swelling present.     Left lower leg: Swelling present.  Skin:    General: Skin is warm.     Findings: No rash.     Comments: Lower extremities covered with wraps.  Neurological:     Mental Status: She  is alert and oriented to person, place, and time.       Data Reviewed: Basic Metabolic Panel: Recent Labs  Lab 12/07/19 1337 12/07/19 1340 12/08/19 0422 12/09/19 0453 12/10/19 0421  NA  --  137 137 141 142  K  --  4.0 3.7 4.2 3.8  CL  --  98 99 102 102  CO2  --  27 28 31  32  GLUCOSE  --  255* 272* 289* 249*  BUN  --  8 8 23 23   CREATININE  --  0.85 0.72 0.80 0.79  CALCIUM  --  8.4* 8.2* 8.5* 8.6*  MG 1.8  --   --   --   --    Liver Function Tests: Recent Labs  Lab 12/07/19 1340 12/08/19 0422 12/09/19 0453 12/10/19 0421  AST 32 43* 34 27  ALT 16 18 19 17   ALKPHOS 54 51 42 43  BILITOT 0.9 0.7 0.5 0.4  PROT 7.7 7.4 6.8 6.5  ALBUMIN 3.7 3.2* 2.8* 2.7*   CBC: Recent Labs  Lab 12/07/19 1340  WBC 6.8  NEUTROABS 5.7  HGB 12.4  HCT 38.8  MCV 80.2  PLT 175   BNP (last 3 results) Recent Labs    12/07/19 1337  BNP 152.7*     CBG: Recent Labs  Lab 12/09/19 1153 12/09/19 1633 12/09/19 2113 12/10/19 0745 12/10/19 1136  GLUCAP  353* 288* 277* 252* 275*    Recent Results (from the past 240 hour(s))  Respiratory Panel by RT PCR (Flu A&B, Covid) - Nasopharyngeal Swab     Status: Abnormal   Collection Time: 12/07/19  1:37 PM   Specimen: Nasopharyngeal Swab  Result Value Ref Range Status   SARS Coronavirus 2 by RT PCR POSITIVE (A) NEGATIVE Final    Comment: RESULT CALLED TO, READ BACK BY AND VERIFIED WITH: AMBER REDAMCE @ 1516 ON 12/07/2019 BY CAF (NOTE) SARS-CoV-2 target nucleic acids are DETECTED.  SARS-CoV-2 RNA is generally detectable in upper respiratory specimens  during the acute phase of infection. Positive results are indicative of the presence of the identified virus, but do not rule out bacterial infection or co-infection with other pathogens not detected by the test. Clinical correlation with patient history and other diagnostic information is necessary to determine patient infection status. The expected result is Negative.  Fact Sheet for  Patients:  https://www.moore.com/  Fact Sheet for Healthcare Providers: https://www.young.biz/  This test is not yet approved or cleared by the [place] FDA and  has been authorized for detection and/or diagnosis of SARS-CoV-2 by FDA under an Emergency Use Authorization (EUA).  This EUA will remain in effect (meaning this test  can be used) for the duration of  the COVID-19 declaration under Section 564(b)(1) of the Act, 21 U.S.C. section 360bbb-3(b)(1), unless the authorization is terminated or revoked sooner.      Influenza A by PCR NEGATIVE NEGATIVE Final   Influenza B by PCR NEGATIVE NEGATIVE Final    Comment: (NOTE) The Xpert Xpress SARS-CoV-2/FLU/RSV assay is intended as an aid in  the diagnosis of influenza from Nasopharyngeal swab specimens and  should not be used as a sole basis for treatment. Nasal washings and  aspirates are unacceptable for Xpert Xpress SARS-CoV-2/FLU/RSV  testing.  Fact Sheet for Patients: https://www.moore.com/  Fact Sheet for Healthcare Providers: https://www.young.biz/  This test is not yet approved or cleared by the [place] FDA and  has been authorized for detection and/or diagnosis of SARS-CoV-2 by  FDA under an Emergency Use Authorization (EUA). This EUA will remain  in effect (meaning this test can be used) for the duration of the  Covid-19 declaration under Section 564(b)(1) of the Act, 21  U.S.C. section 360bbb-3(b)(1), unless the authorization is  terminated or revoked. Performed at [facility], [address], [place], [place] [address]      Studies: [facility] Chest [place] 1 View  Result Date: 12/10/2019 CLINICAL DATA:  74 year old female positive COVID-19. Increased cough. EXAM: PORTABLE CHEST 1 VIEW COMPARISON:  Chest CTA 12/07/2019 and earlier. FINDINGS: Portable AP upright view at 0934 hours. Low normal lung volumes. Mediastinal contours  remain normal. Stable left chest cardiac pacemaker. E minor asymmetric streaky opacity, appears at the left lung stable to base the recent left lower lobe CTA opacity. Lsewhere allowing for portable technique the lungs are clear. Visualized tracheal air column is within normal limits. No pneumothorax or pleural effusion. No acute osseous abnormality identified. IMPRESSION: Stable mild left lung base opacity from the CTA on 12/07/2019. No new cardiopulmonary abnormality. Electronically Signed   By: Odessa Fleming M.D.   On: 12/10/2019 10:05    Scheduled Meds: . atorvastatin  20 mg Oral Daily  . baricitinib  4 mg Oral Daily  . insulin aspart  0-15 Units Subcutaneous TID WC  . insulin aspart  0-5 Units Subcutaneous QHS  . insulin aspart  10 Units Subcutaneous TID WC  .  insulin glargine  25 Units Subcutaneous BID  . levETIRAcetam  750 mg Oral BID  . metoprolol succinate  25 mg Oral Daily  . [START ON 12/11/2019] predniSONE  50 mg Oral Daily  . sacubitril-valsartan  1 tablet Oral BID  . senna  1 tablet Oral BID   Continuous Infusions: . sodium chloride    . remdesivir 100 mg in NS 100 mL 100 mg (12/10/19 0842)    Assessment/Plan:  1. Acute hypoxic respiratory failure secondary to COVID-19 pneumonia. Initial pulse ox low at 87 to 88% on room air. Check a pulse ox on room air. Currently on 2 L. Continue day for remdesivir today. Switch over to prednisone. Continue baricitinib with CRP initially being high. CRP trending better. 2. Hematuria. Patient having no urinary burning. We will get a renal ultrasound including bladder. Discontinue Lovenox. 3. Chronic diastolic congestive heart failure on Entresto and Toprol-XL. Hold Lasix today with the patient having diarrhea. 4. Lower extremity weeping edema and chronic lymphedema. Hold Lasix today. Wound care evaluation appreciated no lesions seen. Wraps changed yesterday. 5. Type 2 diabetes mellitus with hyperlipidemia. Continue increased dose of Lantus 25 units  twice a day and short acting insulin 10 units 3 times a day. Continue atorvastatin. 6. Morbid obesity with a BMI of 57.26 7. COPD 8. Weakness. Physical therapy recommends rehab     Code Status:     Code Status Orders  (From admission, onward)         Start     Ordered   12/07/19 1734  Full code  Continuous        12/07/19 1742        Code Status History    This patient has a current code status but no historical code status.   Advance Care Planning Activity     Family Communication: Left message for the patient's roommate Disposition Plan: Status is: Inpatient  Dispo: The patient is from: Home              Anticipated d/c is to: Rehab              Anticipated d/c date is: Earliest potential would be 12/11/2019 based on patient's clinical condition.              Patient currently being treated for acute hypoxic respiratory failure and COVID-19 pneumonia. Also had hematuria starting today.  Time spent: 28 minutes  [employer]ron [employer]

## 2019-12-10 NOTE — Consult Note (Signed)
WOC Nurse Consult Note: Reason for Consult:Repeat consult for compression application.  This was applied 11/09/19. Please see WOC note. Will change again Thursday. No further WOC needs at this time.  Will follow.   Maple Hudson MSN, RN, FNP-BC CWON Wound, Ostomy, Continence Nurse Pager [phone]

## 2019-12-11 LAB — COMPREHENSIVE METABOLIC PANEL
ALT: 17 U/L (ref 0–44)
AST: 24 U/L (ref 15–41)
Albumin: 2.8 g/dL — ABNORMAL LOW (ref 3.5–5.0)
Alkaline Phosphatase: 41 U/L (ref 38–126)
Anion gap: 9 (ref 5–15)
BUN: 23 mg/dL (ref 8–23)
CO2: 34 mmol/L — ABNORMAL HIGH (ref 22–32)
Calcium: 8.4 mg/dL — ABNORMAL LOW (ref 8.9–10.3)
Chloride: 100 mmol/L (ref 98–111)
Creatinine, Ser: 0.78 mg/dL (ref 0.44–1.00)
GFR calc non Af Amer: >60 mL/min (ref >60)
Glucose, Bld: 140 mg/dL — ABNORMAL HIGH (ref 70–99)
Potassium: 3.2 mmol/L — ABNORMAL LOW (ref 3.5–5.1)
Sodium: 143 mmol/L (ref 135–145)
Total Bilirubin: 0.6 mg/dL (ref 0.3–1.2)
Total Protein: 6.4 g/dL — ABNORMAL LOW (ref 6.5–8.1)

## 2019-12-11 LAB — CBC
HCT: 43.8 % (ref 36.0–46.0)
Hemoglobin: 14 g/dL (ref 12.0–15.0)
MCH: 25.5 pg — ABNORMAL LOW (ref 26.0–34.0)
MCHC: 32 g/dL (ref 30.0–36.0)
MCV: 79.6 fL — ABNORMAL LOW (ref 80.0–100.0)
Platelets: 176 10*3/uL (ref 150–400)
RBC: 5.5 MIL/uL — ABNORMAL HIGH (ref 3.87–5.11)
RDW: 15.1 % (ref 11.5–15.5)
WBC: 6 10*3/uL (ref 4.0–10.5)
nRBC: 0 % (ref 0.0–0.2)

## 2019-12-11 LAB — BRAIN NATRIURETIC PEPTIDE: B Natriuretic Peptide: 35.6 pg/mL (ref 0.0–100.0)

## 2019-12-11 LAB — FIBRIN DERIVATIVES D-DIMER (ARMC ONLY): Fibrin derivatives D-dimer (ARMC): 754.56 ng{FEU}/mL — ABNORMAL HIGH (ref 0.00–499.00)

## 2019-12-11 LAB — GLUCOSE, CAPILLARY
Glucose-Capillary: 133 mg/dL — ABNORMAL HIGH (ref 70–99)
Glucose-Capillary: 134 mg/dL — ABNORMAL HIGH (ref 70–99)
Glucose-Capillary: 178 mg/dL — ABNORMAL HIGH (ref 70–99)
Glucose-Capillary: 175 mg/dL — ABNORMAL HIGH (ref 70–99)

## 2019-12-11 LAB — C-REACTIVE PROTEIN: CRP: 4 mg/dL — ABNORMAL HIGH (ref <1.0)

## 2019-12-11 MED ORDER — FUROSEMIDE 40 MG PO TABS
40.0000 mg | ORAL_TABLET | Freq: Every day | ORAL | Status: DC
  Administered 2019-12-11 – 2019-12-14 (×4): 40 mg via ORAL
  Filled 2019-12-11 (×4): qty 1

## 2019-12-11 MED ORDER — POTASSIUM CHLORIDE CRYS ER 20 MEQ PO TBCR
40.0000 meq | EXTENDED_RELEASE_TABLET | Freq: Once | ORAL | Status: AC
  Administered 2019-12-11: 08:00:00 40 meq via ORAL
  Filled 2019-12-11: qty 2

## 2019-12-11 MED ORDER — HYDRALAZINE HCL 50 MG PO TABS
25.0000 mg | ORAL_TABLET | Freq: Three times a day (TID) | ORAL | Status: DC
  Administered 2019-12-11 – 2019-12-12 (×3): 25 mg via ORAL
  Filled 2019-12-11 (×3): qty 1

## 2019-12-11 NOTE — TOC Progression Note (Signed)
Transition of Care (TOC) - Progression Note    Patient Details  Name: Grace Short. [name] MRN: [id number] Date of Birth: [birth year]  Transition [facility] CM/SW Contact  Allayne Butcher, RN Phone Number: 12/11/2019, 3:00 PM  Clinical Narrative:    [name] unable to offer a COVID bed but [facility] can offer a bed but will not have an open bed until Saturday.  Patient agrees to [facility] and plan for discharge on Saturday.  Patient is currently medically stable.  Insurance authorization is on hold at this time.    Expected Discharge Plan: Skilled Nursing Facility Barriers to Discharge: No SNF bed  Expected Discharge Plan and Services Expected Discharge Plan: Skilled Nursing Facility   Discharge Planning Services: CM Consult Post Acute Care Choice: Skilled Nursing Facility Living arrangements for the past 2 months: Single Family Home                           HH Arranged: NA           Social Determinants of Health (SDOH) Interventions    Readmission Risk Interventions No flowsheet data found.

## 2019-12-11 NOTE — Progress Notes (Addendum)
Daily update given to contact via facetime and also spoke with pt via facetime

## 2019-12-11 NOTE — Progress Notes (Addendum)
PROGRESS NOTE    Grace Short  ZOX:[id number] DOB: 11-Jan-1946 DOA: 12/07/2019 PCP: Shane Crutch, PA   Chief Complain:  Brief Narrative: Patient is a 74 female with history of obesity, OSA, sick sinus syndrome status post pacemaker, diabetes 2, hypertension, hyperlipidemia, chronic bilateral lower extremity lymphedema, chronic heart failure with preserved ejection fraction who presents to the emergency department with complaints of confusion.  Patient is on oxygen at home at 2 to 3 L but has not been oxygen oxygen for last 2 years.  She was also complaining of generalized weakness, bilateral leg swelling.  She was confused on presentation.  On presentation, she was hypoxic on room air needing 3 L of oxygen.  Covid screen tested positive.  Started on remdesivir, Solu-Medrol.  Patient seen by PT/OT and recommended skilled nursing facility.  Overall condition is improving.  DC following for placement.  Patient is medically stable for discharge to skilled nursing facility as soon as bed is available.  Assessment & Plan:   Active Problems:   Chronic heart failure with preserved ejection fraction (HFpEF) (HCC)   Edema of both lower extremities due to peripheral venous insufficiency   Stasis edema with ulcer and inflammation, bilateral (HCC)   SSS (sick sinus syndrome) (HCC)   Pneumonia due to COVID-19 virus   HTN (hypertension)   COPD (chronic obstructive pulmonary disease) (HCC)   Acute respiratory failure with hypoxemia (HCC)   Lymphedema   Type 2 diabetes mellitus with hyperlipidemia (HCC)   Morbid obesity with BMI of 50.0-59.9, adult (HCC)   Weakness   Hematuria   1. Acute hypoxic respiratory failure :secondary to COVID-19 pneumonia. Initial pulse ox low at 87 to 88% on room air.  Currently on 2 L which is her baseline and she is maintaining her saturation. She completed the course of remdesivir. Switched over to prednisone. Continue baricitinib with CRP initially being high. CRP  trending better. 2. Hematuria.  No complaint of dysuria.  Urine is clearing slowly.  Hemoglobin stable.  CT renal stone study showed 3 mm nonobstructing stone in the lower right kidney.  We recommend urology follow-up as an outpatient.  Lovenox was held for hematuria. 3. Chronic diastolic congestive heart failure :on Entresto and Toprol-XL.  Will check BMP.  She has bilateral lower extremity chronic edema.  Will resume Lasix 40 mg daily. 4. Lower extremity weeping edema and chronic lymphedema.  Lasix was held due to diarrhea.Now resumed. Wound care evaluation appreciated no lesions seen. Wraps changed on 12/09/19. 5. Type 2 diabetes mellitus / hyperlipidemia. Continue increased dose of Lantus 25 units twice a day and short acting insulin 10 units 3 times a day. Continue atorvastatin. 6. Morbid obesity: BMI of 57.26 7. COPD: Has bilateral mild expiratory wheezes.  Continue steroids, bronchodilators 8. Weakness. Physical therapy recommends skilled nursing facility placement.  TOC following 9. Hypertension: Added hydralazine.  Continue Entresto, Toprol         DVT prophylaxis:SCD Code Status: Full Family Communication: None  Status is: Inpatient  Remains inpatient appropriate because:Unsafe d/c plan   Dispo: The patient is from: Home              Anticipated d/c is to: SNF              Anticipated d/c date is: 2 days              Patient currently is medically stable to d/c.    Consultants: None  Procedures:None  Antimicrobials:  Anti-infectives (From admission, onward)  Start     Dose/Rate Route Frequency Ordered Stop   12/08/19 1000  remdesivir 100 mg in sodium chloride 0.9 % 100 mL IVPB       "Followed by" Linked Group Details   100 mg 200 mL/hr over 30 Minutes Intravenous Daily 12/07/19 1742 12/11/19 0859   12/07/19 1800  remdesivir 200 mg in sodium chloride 0.9% 250 mL IVPB       "Followed by" Linked Group Details   200 mg 580 mL/hr over 30 Minutes Intravenous Once  12/07/19 1742 12/07/19 1948      Subjective: Patient seen and examined the bedside this morning.  On 2 L of oxygen maintaining saturation.  Hemodynamically stable.  Complains of bilateral lower extremity swelling.  Denies any shortness of breath but still has some cough.  She does not have any plan to take the vaccine in the future for Covid.  Objective: Vitals:   12/10/19 2017 12/11/19 0021 12/11/19 0505 12/11/19 0725  BP: (!) 163/78 (!) 179/80 (!) 175/63 (!) 159/65  Pulse: 68 75 64 68  Resp: (!) 22 20 18  (!) 23  Temp: 98.6 F (37 C) 98.4 F (36.9 C) 98.3 F (36.8 C) 98.4 F (36.9 C)  TempSrc: Oral Oral Oral Oral  SpO2: 98% 95% 91% 91%  Weight:      Height:        Intake/Output Summary (Last 24 hours) at 12/11/2019 1125 Last data filed at 12/11/2019 0600 Gross per 24 hour  Intake 200 ml  Output 500 ml  Net -300 ml   Filed Weights   12/08/19 1149  Weight: (!) 142 kg    Examination:  General exam: Deconditioned, debilitated, morbidly obese HEENT:PERRL,Oral mucosa moist, Ear/Nose normal on gross exam Respiratory system: Bilateral expiratory wheezes  cardiovascular system: S1 & S2 heard, RRR. No JVD, murmurs, rubs, gallops or clicks. Gastrointestinal system: Abdomen is nondistended, soft and nontender. No organomegaly or masses felt. Normal bowel sounds heard. Central nervous system: Alert and oriented. No focal neurological deficits. Extremities: 2-3+ bilateral lower extremity pitting edema, no clubbing ,no cyanosis, lower extremity wrapped with dressings  skin: No rashes, lesions or ulcers,no icterus ,no pallor    Data Reviewed: I have personally reviewed following labs and imaging studies  CBC: Recent Labs  Lab 12/07/19 1340 12/11/19 0535  WBC 6.8 6.0  NEUTROABS 5.7  --   HGB 12.4 14.0  HCT 38.8 43.8  MCV 80.2 79.6*  PLT 175 176   Basic Metabolic Panel: Recent Labs  Lab 12/07/19 1337 12/07/19 1340 12/08/19 0422 12/09/19 0453 12/10/19 0421  12/11/19 0535  NA  --  137 137 141 142 143  K  --  4.0 3.7 4.2 3.8 3.2*  CL  --  98 99 102 102 100  CO2  --  27 28 31  32 34*  GLUCOSE  --  255* 272* 289* 249* 140*  BUN  --  8 8 23 23 23   CREATININE  --  0.85 0.72 0.80 0.79 0.78  CALCIUM  --  8.4* 8.2* 8.5* 8.6* 8.4*  MG 1.8  --   --   --   --   --    GFR: Estimated Creatinine Clearance: 84.6 mL/min (by C-G formula based on SCr of 0.78 mg/dL). Liver Function Tests: Recent Labs  Lab 12/07/19 1340 12/08/19 0422 12/09/19 0453 12/10/19 0421 12/11/19 0535  AST 32 43* 34 27 24  ALT 16 18 19 17 17   ALKPHOS 54 51 42 43 41  BILITOT 0.9 0.7 0.5  0.4 0.6  PROT 7.7 7.4 6.8 6.5 6.4*  ALBUMIN 3.7 3.2* 2.8* 2.7* 2.8*   No results for input(s): LIPASE, AMYLASE in the last 168 hours. No results for input(s): AMMONIA in the last 168 hours. Coagulation Profile: No results for input(s): INR, PROTIME in the last 168 hours. Cardiac Enzymes: No results for input(s): CKTOTAL, CKMB, CKMBINDEX, TROPONINI in the last 168 hours. BNP (last 3 results) No results for input(s): PROBNP in the last 8760 hours. HbA1C: No results for input(s): HGBA1C in the last 72 hours. CBG: Recent Labs  Lab 12/10/19 0745 12/10/19 1136 12/10/19 1631 12/10/19 2016 12/11/19 0728  GLUCAP 252* 275* 204* 199* 133*   Lipid Profile: No results for input(s): CHOL, HDL, LDLCALC, TRIG, CHOLHDL, LDLDIRECT in the last 72 hours. Thyroid Function Tests: No results for input(s): TSH, T4TOTAL, FREET4, T3FREE, THYROIDAB in the last 72 hours. Anemia Panel: No results for input(s): VITAMINB12, FOLATE, FERRITIN, TIBC, IRON, RETICCTPCT in the last 72 hours. Sepsis Labs: Recent Labs  Lab 12/07/19 1337 12/07/19 1340  PROCALCITON  --  0.59  LATICACIDVEN 1.5  --     Recent Results (from the past 240 hour(s))  Respiratory Panel by RT PCR (Flu A&B, Covid) - Nasopharyngeal Swab     Status: Abnormal   Collection Time: 12/07/19  1:37 PM   Specimen: Nasopharyngeal Swab  Result  Value Ref Range Status   SARS Coronavirus 2 by RT PCR POSITIVE (A) NEGATIVE Final    Comment: RESULT CALLED TO, READ BACK BY AND VERIFIED WITH: AMBER REDAMCE @ 1516 ON 12/07/2019 BY CA[facility] (NOTE) SARS-CoV-2 target nucleic acids are DETECTED.  SARS-CoV-2 RNA is generally detectable in upper respiratory specimens  during the acute phase of infection. Positive results are indicative of the presence of the identified virus, but do not rule out bacterial infection or co-infection with other pathogens not detected by the test. Clinical correlation with patient history and other diagnostic information is necessary to determine patient infection status. The expected result is Negative.  Fact Sheet for Patients:  https://www.moore.com/  Fact Sheet for Healthcare Providers: https://www.young.biz/  This test is not yet approved or cleared by the [place] FDA and  has been authorized for detection and/or diagnosis of SARS-CoV-2 by FDA under an Emergency Use Authorization (EUA).  This EUA will remain in effect (meaning this test  can be used) for the duration of  the COVID-19 declaration under Section 564(b)(1) of the Act, 21 U.S.C. section 360bbb-3(b)(1), unless the authorization is terminated or revoked sooner.      Influenza A by PCR NEGATIVE NEGATIVE Final   Influenza B by PCR NEGATIVE NEGATIVE Final    Comment: (NOTE) The Xpert Xpress SARS-CoV-2/FLU/RSV assay is intended as an aid in  the diagnosis of influenza from Nasopharyngeal swab specimens and  should not be used as a sole basis for treatment. Nasal washings and  aspirates are unacceptable for Xpert Xpress SARS-CoV-2/FLU/RSV  testing.  Fact Sheet for Patients: https://www.moore.com/  Fact Sheet for Healthcare Providers: https://www.young.biz/  This test is not yet approved or cleared by the [place] FDA and  has been authorized for  detection and/or diagnosis of SARS-CoV-2 by  FDA under an Emergency Use Authorization (EUA). This EUA will remain  in effect (meaning this test can be used) for the duration of the  Covid-19 declaration under Section 564(b)(1) of the Act, 21  U.S.C. section 360bbb-3(b)(1), unless the authorization is  terminated or revoked. Performed at [facility], [address] [place], [place]  [phone]          Radiology Studies: US RENAL  Result Date: 12/10/2019 CLINICAL DATA:  74 year old female with gross hematuria. Positive COVID-19. EXAM: RENAL / URINARY TRACT ULTRASOUND COMPLETE COMPARISON:  Chest CTA 12/07/2019. FINDINGS: Right Kidney: Renal measurements: 10.5 x 4.8 x 4.9 cm = volume: 128 mL. Preserved cortical echogenicity. No hydronephrosis. Right renal midpole cyst measuring about 3 cm diameter, also suggested on the recent CTA. This appears simple/benign with no internal vascular elements on brief Doppler (image 18). Left Kidney: Renal measurements: 10.4 x 5.2 x 5.4 cm = volume: 151 mL. Preserved cortical echogenicity. No hydronephrosis. Possible 7-8 mm renal calculus on image 32. Bladder: Diminutive, unremarkable. Other: Shadowing gravel-like gallstones redemonstrated on image 7. IMPRESSION: 1. Questionable left nephrolithiasis but otherwise normal for age ultrasound appearance of both kidneys. No solid renal mass or evidence of obstructive uropathy. 2. Cholelithiasis. Electronically Signed   By: Odessa FlemingH  Hall M.D.   On: 12/10/2019 14:21   DG Chest Port 1 View  Result Date: 12/10/2019 CLINICAL DATA:  74 year old female positive COVID-19. Increased cough. EXAM: PORTABLE CHEST 1 VIEW COMPARISON:  Chest CTA 12/07/2019 and earlier. FINDINGS: Portable AP upright view at 0934 hours. Low normal lung volumes. Mediastinal contours remain normal. Stable left chest cardiac pacemaker. E minor asymmetric streaky opacity, appears at the left lung stable to base the recent left lower lobe CTA opacity.  Lsewhere allowing for portable technique the lungs are clear. Visualized tracheal air column is within normal limits. No pneumothorax or pleural effusion. No acute osseous abnormality identified. IMPRESSION: Stable mild left lung base opacity from the CTA on 12/07/2019. No new cardiopulmonary abnormality. Electronically Signed   By: Odessa FlemingH  Hall M.D.   On: 12/10/2019 10:05   CT RENAL STONE STUDY  Result Date: 12/10/2019 CLINICAL DATA:  Hematuria. EXAM: CT ABDOMEN AND PELVIS WITHOUT CONTRAST TECHNIQUE: Multidetector CT imaging of the abdomen and pelvis was performed following the standard protocol without IV contrast. COMPARISON:  Renal ultrasound earlier today. FINDINGS: Lower chest: Cardiomegaly, pacemaker partially included. Streaky and ground-glass opacities in the lung bases, not significantly changed from chest CT 3 days ago. Trace left pleural thickening. Hepatobiliary: Prominent liver spanning 18.4 cm cranial caudal. No evidence of focal lesion. High-density material in the gallbladder may represent gallstones or vicarious excretion of IV contrast from prior chest CT. There is no pericholecystic inflammation. Pancreas: Mild fatty atrophy.  No ductal dilatation or inflammation. Spleen: Normal in size. Small ill-defined low-density lesions in the spleen are too small to accurately characterize. Adrenals/Urinary Tract: No adrenal nodule. 3 mm calcification in the lower right kidney favor nonobstructing stone over vascular calcifications. There is a simple 2.9 cm cyst posteriorly in the right kidney. No hydronephrosis of either kidney. There is mild symmetric perinephric edema. No ureteral calculi. Nondistended urinary bladder without wall thickening. No bladder stone. Stomach/Bowel: Decompressed stomach. There is no small bowel obstruction or inflammation. Enteric chain sutures noted in small bowel in the lower abdomen. Appendix is not confidently visualized. Moderate stool in the ascending colon with air-filled  transverse colon. Gaseous distension of loop of redundant sigmoid colon without wall thickening. No colonic wall thickening. No evidence of colonic mass. Vascular/Lymphatic: Mild aortic atherosclerosis. No aortic aneurysm. There is no bulky abdominopelvic adenopathy. Reproductive: Multiple uterine fibroids many of which are calcified. Ovaries are not discretely visualized. There is no suspicious adnexal mass. Other: No ascites. No free air. Postsurgical change of the anterior abdominal wall with abdominal muscle laxity. Musculoskeletal: Grade 1 anterolisthesis of  L4 on L5 is likely degenerative and facet mediated. There is facet hypertrophy from L3-L4 through L5-S1. Multilevel degenerative disc disease. Scattered sclerotic foci throughout the pelvis typical of bone islands. IMPRESSION: 1. Nonobstructing 3 mm stone in the lower right kidney. No hydronephrosis or obstructive uropathy. 2. Mild symmetric perinephric edema is nonspecific, may be chronic or seen with urinary tract infection. Recommend correlation with urinalysis. 3. Multiple uterine fibroids. 4. High-density material in the gallbladder may represent gallstones or vicarious excretion of IV contrast from prior chest CT. No pericholecystic inflammation. Aortic Atherosclerosis (ICD10-I70.0). Electronically Signed   By: Narda Rutherford M.D.   On: 12/10/2019 20:27        Scheduled Meds: . atorvastatin  20 mg Oral Daily  . baricitinib  4 mg Oral Daily  . insulin aspart  0-15 Units Subcutaneous TID WC  . insulin aspart  0-5 Units Subcutaneous QHS  . insulin aspart  10 Units Subcutaneous TID WC  . insulin glargine  25 Units Subcutaneous BID  . levETIRAcetam  750 mg Oral BID  . metoprolol succinate  25 mg Oral Daily  . predniSONE  50 mg Oral Daily  . sacubitril-valsartan  1 tablet Oral BID  . senna  1 tablet Oral BID   Continuous Infusions: . sodium chloride       LOS: 4 days    Time spent: 25 mins.More than 50% of that time was spent in  counseling and/or coordination of care.      Burnadette Pop, MD [facility] P10/08/2019, 11:25 AM

## 2019-12-11 NOTE — Progress Notes (Signed)
Dr Renford Dills face to face, made aware that pts urine still dark/red with sediment, states that he saw urine while in pts room, acknowledged, no new orders

## 2019-12-12 ENCOUNTER — Encounter (INDEPENDENT_AMBULATORY_CARE_PROVIDER_SITE_OTHER): Payer: 59 | Admitting: Vascular Surgery

## 2019-12-12 LAB — CBC WITH DIFFERENTIAL/PLATELET
Abs Immature Granulocytes: 0.03 10*3/uL (ref 0.00–0.07)
Basophils Absolute: 0 10*3/uL (ref 0.0–0.1)
Basophils Relative: 0 %
Eosinophils Absolute: 0 10*3/uL (ref 0.0–0.5)
Eosinophils Relative: 0 %
HCT: 46.2 % — ABNORMAL HIGH (ref 36.0–46.0)
Hemoglobin: 14.5 g/dL (ref 12.0–15.0)
Immature Granulocytes: 0 %
Lymphocytes Relative: 11 %
Lymphs Abs: 0.7 10*3/uL (ref 0.7–4.0)
MCH: 25.3 pg — ABNORMAL LOW (ref 26.0–34.0)
MCHC: 31.4 g/dL (ref 30.0–36.0)
MCV: 80.5 fL (ref 80.0–100.0)
Monocytes Absolute: 0.3 10*3/uL (ref 0.1–1.0)
Monocytes Relative: 5 %
Neutro Abs: 5.8 10*3/uL (ref 1.7–7.7)
Neutrophils Relative %: 84 %
Platelets: 163 10*3/uL (ref 150–400)
RBC: 5.74 MIL/uL — ABNORMAL HIGH (ref 3.87–5.11)
RDW: 15.3 % (ref 11.5–15.5)
WBC: 6.9 10*3/uL (ref 4.0–10.5)
nRBC: 0 % (ref 0.0–0.2)

## 2019-12-12 LAB — URINALYSIS, ROUTINE W REFLEX MICROSCOPIC
Bilirubin Urine: NEGATIVE
Glucose, UA: 50 mg/dL — AB
Ketones, ur: NEGATIVE mg/dL
Leukocytes,Ua: NEGATIVE
Nitrite: POSITIVE — AB
Protein, ur: 30 mg/dL — AB
RBC / HPF: >50 RBC/hpf — ABNORMAL HIGH (ref 0–5)
Specific Gravity, Urine: 1.012 (ref 1.005–1.030)
pH: 8 (ref 5.0–8.0)

## 2019-12-12 LAB — GLUCOSE, CAPILLARY
Glucose-Capillary: 55 mg/dL — ABNORMAL LOW (ref 70–99)
Glucose-Capillary: 100 mg/dL — ABNORMAL HIGH (ref 70–99)
Glucose-Capillary: 202 mg/dL — ABNORMAL HIGH (ref 70–99)
Glucose-Capillary: 276 mg/dL — ABNORMAL HIGH (ref 70–99)
Glucose-Capillary: 221 mg/dL — ABNORMAL HIGH (ref 70–99)

## 2019-12-12 LAB — COMPREHENSIVE METABOLIC PANEL
ALT: 18 U/L (ref 0–44)
AST: 24 U/L (ref 15–41)
Albumin: 2.7 g/dL — ABNORMAL LOW (ref 3.5–5.0)
Alkaline Phosphatase: 43 U/L (ref 38–126)
Anion gap: 11 (ref 5–15)
BUN: 20 mg/dL (ref 8–23)
CO2: 34 mmol/L — ABNORMAL HIGH (ref 22–32)
Calcium: 8.1 mg/dL — ABNORMAL LOW (ref 8.9–10.3)
Chloride: 99 mmol/L (ref 98–111)
Creatinine, Ser: 0.76 mg/dL (ref 0.44–1.00)
GFR calc non Af Amer: >60 mL/min (ref >60)
Glucose, Bld: 68 mg/dL — ABNORMAL LOW (ref 70–99)
Potassium: 3.1 mmol/L — ABNORMAL LOW (ref 3.5–5.1)
Sodium: 144 mmol/L (ref 135–145)
Total Bilirubin: 0.8 mg/dL (ref 0.3–1.2)
Total Protein: 6.2 g/dL — ABNORMAL LOW (ref 6.5–8.1)

## 2019-12-12 LAB — C-REACTIVE PROTEIN: CRP: 5.4 mg/dL — ABNORMAL HIGH (ref <1.0)

## 2019-12-12 LAB — FIBRIN DERIVATIVES D-DIMER (ARMC ONLY): Fibrin derivatives D-dimer (ARMC): 692.68 ng/mL (FEU) — ABNORMAL HIGH (ref 0.00–499.00)

## 2019-12-12 MED ORDER — POTASSIUM CHLORIDE CRYS ER 20 MEQ PO TBCR: 40.0000 meq | EXTENDED_RELEASE_TABLET | Freq: Once | ORAL | Status: DC

## 2019-12-12 MED ORDER — HYDRALAZINE HCL 50 MG PO TABS
50.0000 mg | ORAL_TABLET | Freq: Three times a day (TID) | ORAL | Status: DC
  Administered 2019-12-12 – 2019-12-14 (×7): 50 mg via ORAL
  Filled 2019-12-12 (×7): qty 1

## 2019-12-12 MED ORDER — POTASSIUM CHLORIDE CRYS ER 20 MEQ PO TBCR
40.0000 meq | EXTENDED_RELEASE_TABLET | Freq: Every day | ORAL | Status: DC
  Administered 2019-12-12 – 2019-12-14 (×3): 40 meq via ORAL
  Filled 2019-12-12 (×3): qty 2

## 2019-12-12 MED ORDER — INSULIN ASPART 100 UNIT/ML ~~LOC~~ SOLN
7.0000 [IU] | Freq: Three times a day (TID) | SUBCUTANEOUS | Status: DC
  Administered 2019-12-12 – 2019-12-14 (×5): 7 [IU] via SUBCUTANEOUS
  Filled 2019-12-12 (×6): qty 1

## 2019-12-12 MED ORDER — INSULIN GLARGINE 100 UNIT/ML ~~LOC~~ SOLN
20.0000 [IU] | Freq: Two times a day (BID) | SUBCUTANEOUS | Status: DC
  Administered 2019-12-12 – 2019-12-14 (×4): 20 [IU] via SUBCUTANEOUS
  Filled 2019-12-12 (×10): qty 0.2

## 2019-12-12 NOTE — Progress Notes (Signed)
Cleansed and changed dressing to bilateral lower legs. She is resting in bed. purewick was changed to periarea. She is not complaining of any pain or distress. Bed in low position and call bell in reach.

## 2019-12-12 NOTE — Progress Notes (Signed)
Patient resting in bed. Went into room and checked on patient, emptied out suction canister filled with dark red bloody urine. Replaced canister and reapplied suction to purewick. While adjusting patient in bed, she began to urinate and urine color collected into canister as dark yellow. She has no complaints or distress. Will collect urine specimen for lab. Bed in low position and call bell in reach.

## 2019-12-12 NOTE — Progress Notes (Signed)
Inpatient Diabetes Program Recommendations  AACE/ADA: New Consensus Statement on Inpatient Glycemic Control   Target Ranges:  Prepandial:   less than 140 mg/dL      Peak postprandial:   less than 180 mg/dL (1-2 hours)      Critically ill patients:  140 - 180 mg/dL   Results for SHAREESE, MACHA (MRN [id number]) as of 12/12/2019 11:23  Ref. Range 12/11/2019 07:28 12/11/2019 11:50 12/11/2019 16:58 12/11/2019 20:33 12/12/2019 07:38  Glucose-Capillary Latest Ref Range: 70 - 99 mg/dL 937 (H) 342 (H) 876 (H) 175 (H) 55 (L)   Review of Glycemic Control  Diabetes history: DM2 Outpatient Diabetes medications: Lantus 20 units BID, Humalog 10 units TID with meals Current orders for Inpatient glycemic control: Lantus 25 units BID, Novolog 0-15 units TID with meals, Novolog 0-5 units QHS, Novolog 10 units TID with meals; Prednisone 50 mg daily  Inpatient Diabetes Program Recommendations:    Insulin: Noted steroids decreased from Solumedrol to Prednisone and fasting glucose 55 mg/dl today. If steroids are continued as currently ordered, please consider decreasing Lantus to 20 units BID and meal coverage to Novolog 7 units TID with meals.  Thanks, Orlando Penner, RN, MSN, CDE Diabetes Coordinator Inpatient Diabetes Program [phone] (Team Pager from 8am to 5pm)

## 2019-12-12 NOTE — Progress Notes (Signed)
PT Cancellation Note  Patient Details Name: Grace Short. Grace Short MRN: [id number] DOB: 06-Mar-1946   Cancelled Treatment:     PT attempt 2 x this date. Pt politely refused," I just finished my lunch and need time to digest." Max encouragement for even bed level activity however pt request return next date. Acute PT will continue to follow and progress as able per POC.    [name] 12/12/2019, 2:26 PM

## 2019-12-12 NOTE — Progress Notes (Signed)
PROGRESS NOTE    Grace Short  HQR:[id number] DOB: [birth year] DOA: 12/07/2019 PCP: Shane Crutch, PA   Chief Complain:  Brief Narrative: Patient is a 19 female with history of obesity, OSA, sick sinus syndrome status post pacemaker, diabetes 2, hypertension, hyperlipidemia, chronic bilateral lower extremity lymphedema, chronic heart failure with preserved ejection fraction who presents to the emergency department with complaints of confusion.  Patient is on oxygen at home at 2 to 3 L but has not been oxygen oxygen for last 2 years.  She was also complaining of generalized weakness, bilateral leg swelling.  She was confused on presentation.  On presentation, she was hypoxic on room air needing 3 L of oxygen.  Covid screen tested positive.  Started on remdesivir, Solu-Medrol.  Patient seen by PT/OT and recommended skilled nursing facility.  Overall condition is improving.  DC following for placement.  Patient is medically stable for discharge to skilled nursing facility as soon as bed is available.  Assessment & Plan:   Active Problems:   Chronic heart failure with preserved ejection fraction (HFpEF) (HCC)   Edema of both lower extremities due to peripheral venous insufficiency   Stasis edema with ulcer and inflammation, bilateral (HCC)   SSS (sick sinus syndrome) (HCC)   Pneumonia due to COVID-19 virus   HTN (hypertension)   COPD (chronic obstructive pulmonary disease) (HCC)   Acute respiratory failure with hypoxemia (HCC)   Lymphedema   Type 2 diabetes mellitus with hyperlipidemia (HCC)   Morbid obesity with BMI of 50.0-59.9, adult (HCC)   Weakness   Hematuria   1. Acute hypoxic respiratory failure :secondary to COVID-19 pneumonia. Initial pulse ox low at 87 to 88% on room air.  Currently on 2 L which is her baseline and she is maintaining her saturation. She completed the course of remdesivir. Switched over to prednisone. Continue baricitinib with CRP initially being high. CRP  trending down. 2. Hematuria.  No complaint of dysuria.  He is still redness/dark.Check new Hb today.  CT renal stone study showed 3 mm nonobstructing stone in the lower right kidney.  We requested urology consult.  Lovenox was held for hematuria. 3. Chronic diastolic congestive heart failure :on Entresto and Toprol-XL.  Will check BMP.  She has bilateral lower extremity chronic edema.  Started Lasix 40 mg daily. 4. Lower extremity weeping edema and chronic lymphedema.  Lasix was held due to diarrhea.Now resumed.  Continue potassium supplementation along with Lasix.  Wound care evaluation appreciated no lesions seen. Wraps changed on 12/09/19. 5. Type 2 diabetes mellitus / hyperlipidemia. Continue increased dose of Lantus 25 units twice a day and short acting insulin 10 units 3 times a day. Continue atorvastatin. 6. Morbid obesity: BMI of 57.26 7. COPD: Has bilateral mild expiratory wheezes.  Continue steroids, bronchodilators 8. Weakness. Physical therapy recommends skilled nursing facility placement.  TOC following 9. Hypertension: Added hydralazine.  Continue Entresto, Toprol.  Monitor blood pressure 10.Hypokalemia: Continue potassium supplementation.          DVT prophylaxis:SCD Code Status: Full Family Communication: None  Status is: Inpatient  Remains inpatient appropriate because:Unsafe d/c plan   Dispo: The patient is from: Home              Anticipated d/c is to: SNF              Anticipated d/c date is: 2 days              Patient currently is medically stable to d/c.  Patient has a bed in skilled nursing  facility on Saturday.    Consultants: None  Procedures:None  Antimicrobials:  Anti-infectives (From admission, onward)   Start     Dose/Rate Route Frequency Ordered Stop   12/08/19 1000  remdesivir 100 mg in sodium chloride 0.9 % 100 mL IVPB       "Followed by" Linked Group Details   100 mg 200 mL/hr over 30 Minutes Intravenous Daily 12/07/19 1742 12/11/19 0859    12/07/19 1800  remdesivir 200 mg in sodium chloride 0.9% 250 mL IVPB       "Followed by" Linked Group Details   200 mg 580 mL/hr over 30 Minutes Intravenous Once 12/07/19 1742 12/07/19 1948      Subjective:  Patient seen and examined the bedside this morning.  Denies any new complaints.  Her urine is stable reddish/dark.  She denies any lower abdominal pain.  Respiratory status stable and she is on 2L .  Objective: Vitals:   12/11/19 1438 12/11/19 1700 12/11/19 2033 12/12/19 0432  BP: (!) 160/77  (!) 167/89 (!) 177/67  Pulse: 77  71 75  Resp: 16  16 18   Temp: 98.2 F (36.8 C)  98.7 F (37.1 C) 98.5 F (36.9 C)  TempSrc: Oral  Oral Oral  SpO2: 93% 92% 98% 94%  Weight:      Height:        Intake/Output Summary (Last 24 hours) at 12/12/2019 02/11/2020 Last data filed at 12/12/2019 0554 Gross per 24 hour  Intake --  Output 1800 ml  Net -1800 ml   Filed Weights   12/08/19 1149  Weight: (!) 142 kg    Examination:   General exam: Generalized weakness, deconditioned, debilitated, morbidly obese HEENT:PERRL,Oral mucosa moist, Ear/Nose normal on gross exam Respiratory system: Bilateral mild expiratory wheezes  cardiovascular system: S1 & S2 heard, RRR. No JVD, murmurs, rubs, gallops or clicks. Gastrointestinal system: Abdomen is nondistended, soft and nontender. No organomegaly or masses felt. Normal bowel sounds heard. Central nervous system: Alert and oriented. No focal neurological deficits. Extremities: 1-2+ lower extremity edema, lower extremities wrapped with dressings, no clubbing ,no cyanosis Skin: No rashes, lesions or ulcers,no icterus ,no pallor     Data Reviewed: I have personally reviewed following labs and imaging studies  CBC: Recent Labs  Lab 12/07/19 1340 12/11/19 0535  WBC 6.8 6.0  NEUTROABS 5.7  --   HGB 12.4 14.0  HCT 38.8 43.8  MCV 80.2 79.6*  PLT 175 176   Basic Metabolic Panel: Recent Labs  Lab 12/07/19 1337 12/07/19 1340 12/08/19 0422  12/09/19 0453 12/10/19 0421 12/11/19 0535 12/12/19 0452  NA  --    < > 137 141 142 143 144  K  --    < > 3.7 4.2 3.8 3.2* 3.1*  CL  --    < > 99 102 102 100 99  CO2  --    < > 28 31 32 34* 34*  GLUCOSE  --    < > 272* 289* 249* 140* 68*  BUN  --    < > 8 23 23 23 20   CREATININE  --    < > 0.72 0.80 0.79 0.78 0.76  CALCIUM  --    < > 8.2* 8.5* 8.6* 8.4* 8.1*  MG 1.8  --   --   --   --   --   --    < > = values in this interval not displayed.   GFR: Estimated Creatinine Clearance:  84.6 mL/min (by C-G formula based on SCr of 0.76 mg/dL). Liver Function Tests: Recent Labs  Lab 12/08/19 0422 12/09/19 0453 12/10/19 0421 12/11/19 0535 12/12/19 0452  AST 43* 34 ALT ALKPHOS 51 42 43 41 43  BILITOT 0.7 0.5 0.4 0.6 0.8  PROT 7.4 6.8 6.5 6.4* 6.2*  ALBUMIN 3.2* 2.8* 2.7* 2.8* 2.7*   No results for input(s): LIPASE, AMYLASE in the last 168 hours. No results for input(s): AMMONIA in the last 168 hours. Coagulation Profile: No results for input(s): INR, PROTIME in the last 168 hours. Cardiac Enzymes: No results for input(s): CKTOTAL, CKMB, CKMBINDEX, TROPONINI in the last 168 hours. BNP (last 3 results) No results for input(s): PROBNP in the last 8760 hours. HbA1C: No results for input(s): HGBA1C in the last 72 hours. CBG: Recent Labs  Lab 12/10/19 2016 12/11/19 0728 12/11/19 1150 12/11/19 1658 12/11/19 2033  GLUCAP 199* 133* 134* 178* 175*   Lipid Profile: No results for input(s): CHOL, HDL, LDLCALC, TRIG, CHOLHDL, LDLDIRECT in the last 72 hours. Thyroid Function Tests: No results for input(s): TSH, T4TOTAL, FREET4, T3FREE, THYROIDAB in the last 72 hours. Anemia Panel: No results for input(s): VITAMINB12, FOLATE, FERRITIN, TIBC, IRON, RETICCTPCT in the last 72 hours. Sepsis Labs: Recent Labs  Lab 12/07/19 1337 12/07/19 1340  PROCALCITON  --  0.59  LATICACIDVEN 1.5  --     Recent Results (from the past 240 hour(s))  Respiratory Panel by  RT PCR (Flu A&B, Covid) - Nasopharyngeal Swab     Status: Abnormal   Collection Time: 12/07/19  1:37 PM   Specimen: Nasopharyngeal Swab  Result Value Ref Range Status   SARS Coronavirus 2 by RT PCR POSITIVE (A) NEGATIVE Final    Comment: RESULT CALLED TO, READ BACK BY AND VERIFIED WITH: AMBER REDAMCE @ 1516 ON 12/07/2019 BY CAF (NOTE) SARS-CoV-2 target nucleic acids are DETECTED.  SARS-CoV-2 RNA is generally detectable in upper respiratory specimens  during the acute phase of infection. Positive results are indicative of the presence of the identified virus, but do not rule out bacterial infection or co-infection with other pathogens not detected by the test. Clinical correlation with patient history and other diagnostic information is necessary to determine patient infection status. The expected result is Negative.  Fact Sheet for Patients:  https://www.moore.com/  Fact Sheet for Healthcare Providers: https://www.young.biz/  This test is not yet approved or cleared by the [place] FDA and  has been authorized for detection and/or diagnosis of SARS-CoV-2 by FDA under an Emergency Use Authorization (EUA).  This EUA will remain in effect (meaning this test  can be used) for the duration of  the COVID-19 declaration under Section 564(b)(1) of the Act, 21 U.S.C. section 360bbb-3(b)(1), unless the authorization is terminated or revoked sooner.      Influenza A by PCR NEGATIVE NEGATIVE Final   Influenza B by PCR NEGATIVE NEGATIVE Final    Comment: (NOTE) The Xpert Xpress SARS-CoV-2/FLU/RSV assay is intended as an aid in  the diagnosis of influenza from Nasopharyngeal swab specimens and  should not be used as a sole basis for treatment. Nasal washings and  aspirates are unacceptable for Xpert Xpress SARS-CoV-2/FLU/RSV  testing.  Fact Sheet for Patients: https://www.moore.com/  Fact Sheet for Healthcare  Providers: https://www.young.biz/  This test is not yet approved or cleared by the [place] FDA and  has been authorized for detection and/or diagnosis of SARS-CoV-2 by  FDA under an Emergency  Use Authorization (EUA). This EUA will remain  in effect (meaning this test can be used) for the duration of the  Covid-19 declaration under Section 564(b)(1) of the Act, 21  U.S.C. section 360bbb-3(b)(1), unless the authorization is  terminated or revoked. Performed at [facility], [address] [place], [place] [id number]          Radiology Studies: US RENAL  Result Date: 12/10/2019 CLINICAL DATA:  74 year old female with gross hematuria. Positive COVID-19. EXAM: RENAL / URINARY TRACT ULTRASOUND COMPLETE COMPARISON:  Chest CTA 12/07/2019. FINDINGS: Right Kidney: Renal measurements: 10.5 x 4.8 x 4.9 cm = volume: 128 mL. Preserved cortical echogenicity. No hydronephrosis. Right renal midpole cyst measuring about 3 cm diameter, also suggested on the recent CTA. This appears simple/benign with no internal vascular elements on brief Doppler (image 18). Left Kidney: Renal measurements: 10.4 x 5.2 x 5.4 cm = volume: 151 mL. Preserved cortical echogenicity. No hydronephrosis. Possible 7-8 mm renal calculus on image 32. Bladder: Diminutive, unremarkable. Other: Shadowing gravel-like gallstones redemonstrated on image 7. IMPRESSION: 1. Questionable left nephrolithiasis but otherwise normal for age ultrasound appearance of both kidneys. No solid renal mass or evidence of obstructive uropathy. 2. Cholelithiasis. Electronically Signed   By: Odessa FlemingH  Hall M.D.   On: 12/10/2019 14:21   DG Chest Port 1 View  Result Date: 12/10/2019 CLINICAL DATA:  74 year old female positive COVID-19. Increased cough. EXAM: PORTABLE CHEST 1 VIEW COMPARISON:  Chest CTA 12/07/2019 and earlier. FINDINGS: Portable AP upright view at 0934 hours. Low normal lung volumes. Mediastinal contours remain normal.  Stable left chest cardiac pacemaker. E minor asymmetric streaky opacity, appears at the left lung stable to base the recent left lower lobe CTA opacity. Lsewhere allowing for portable technique the lungs are clear. Visualized tracheal air column is within normal limits. No pneumothorax or pleural effusion. No acute osseous abnormality identified. IMPRESSION: Stable mild left lung base opacity from the CTA on 12/07/2019. No new cardiopulmonary abnormality. Electronically Signed   By: Odessa FlemingH  Hall M.D.   On: 12/10/2019 10:05   CT RENAL STONE STUDY  Result Date: 12/10/2019 CLINICAL DATA:  Hematuria. EXAM: CT ABDOMEN AND PELVIS WITHOUT CONTRAST TECHNIQUE: Multidetector CT imaging of the abdomen and pelvis was performed following the standard protocol without IV contrast. COMPARISON:  Renal ultrasound earlier today. FINDINGS: Lower chest: Cardiomegaly, pacemaker partially included. Streaky and ground-glass opacities in the lung bases, not significantly changed from chest CT 3 days ago. Trace left pleural thickening. Hepatobiliary: Prominent liver spanning 18.4 cm cranial caudal. No evidence of focal lesion. High-density material in the gallbladder may represent gallstones or vicarious excretion of IV contrast from prior chest CT. There is no pericholecystic inflammation. Pancreas: Mild fatty atrophy.  No ductal dilatation or inflammation. Spleen: Normal in size. Small ill-defined low-density lesions in the spleen are too small to accurately characterize. Adrenals/Urinary Tract: No adrenal nodule. 3 mm calcification in the lower right kidney favor nonobstructing stone over vascular calcifications. There is a simple 2.9 cm cyst posteriorly in the right kidney. No hydronephrosis of either kidney. There is mild symmetric perinephric edema. No ureteral calculi. Nondistended urinary bladder without wall thickening. No bladder stone. Stomach/Bowel: Decompressed stomach. There is no small bowel obstruction or inflammation.  Enteric chain sutures noted in small bowel in the lower abdomen. Appendix is not confidently visualized. Moderate stool in the ascending colon with air-filled transverse colon. Gaseous distension of loop of redundant sigmoid colon without wall thickening. No colonic wall thickening. No evidence of colonic mass. Vascular/Lymphatic:  Mild aortic atherosclerosis. No aortic aneurysm. There is no bulky abdominopelvic adenopathy. Reproductive: Multiple uterine fibroids many of which are calcified. Ovaries are not discretely visualized. There is no suspicious adnexal mass. Other: No ascites. No free air. Postsurgical change of the anterior abdominal wall with abdominal muscle laxity. Musculoskeletal: Grade 1 anterolisthesis of L4 on L5 is likely degenerative and facet mediated. There is facet hypertrophy from L3-L4 through L5-S1. Multilevel degenerative disc disease. Scattered sclerotic foci throughout the pelvis typical of bone islands. IMPRESSION: 1. Nonobstructing 3 mm stone in the lower right kidney. No hydronephrosis or obstructive uropathy. 2. Mild symmetric perinephric edema is nonspecific, may be chronic or seen with urinary tract infection. Recommend correlation with urinalysis. 3. Multiple uterine fibroids. 4. High-density material in the gallbladder may represent gallstones or vicarious excretion of IV contrast from prior chest CT. No pericholecystic inflammation. Aortic Atherosclerosis (ICD10-I70.0). Electronically Signed   By: Narda Rutherford M.D.   On: 12/10/2019 20:27        Scheduled Meds: . atorvastatin  20 mg Oral Daily  . baricitinib  4 mg Oral Daily  . furosemide  40 mg Oral Daily  . hydrALAZINE  25 mg Oral Q8H  . insulin aspart  0-15 Units Subcutaneous TID WC  . insulin aspart  0-5 Units Subcutaneous QHS  . insulin aspart  10 Units Subcutaneous TID WC  . insulin glargine  25 Units Subcutaneous BID  . levETIRAcetam  750 mg Oral BID  . metoprolol succinate  25 mg Oral Daily  . potassium  chloride  40 mEq Oral Daily  . predniSONE  50 mg Oral Daily  . sacubitril-valsartan  1 tablet Oral BID  . senna  1 tablet Oral BID   Continuous Infusions: . sodium chloride       LOS: 5 days    Time spent: 25 mins.More than 50% of that time was spent in counseling and/or coordination of care.      Burnadette Pop, MD [facility] P10/09/2019, 7:33 AM

## 2019-12-12 NOTE — Consult Note (Signed)
WOC Nurse wound follow up Wound type:Chronic edema/lymphedema Measurement:no open wounds Wound bed:NA Drainage (amount, consistency, odor) NOne Periwound:edema, chronic lower leg with dry skin and chronic skin changes Dressing procedure/placement/frequency:LEgs cleansed with soap and water.  Wrapped with kerlix from below toes to below knee.  Due to anatomy, slightly below knee. Secured with coban.  Change twice weekly.  Due again Monday.  Will follow.  Maple Hudson MSN, RN, FNP-BC CWON Wound, Ostomy, Continence Nurse Pager [phone]

## 2019-12-13 ENCOUNTER — Ambulatory Visit: Payer: 59 | Admitting: Physician Assistant

## 2019-12-13 LAB — CBC
HCT: 45.3 % (ref 36.0–46.0)
Hemoglobin: 14.3 g/dL (ref 12.0–15.0)
MCH: 25.6 pg — ABNORMAL LOW (ref 26.0–34.0)
MCHC: 31.6 g/dL (ref 30.0–36.0)
MCV: 81 fL (ref 80.0–100.0)
Platelets: 192 10*3/uL (ref 150–400)
RBC: 5.59 MIL/uL — ABNORMAL HIGH (ref 3.87–5.11)
RDW: 15 % (ref 11.5–15.5)
WBC: 13.1 10*3/uL — ABNORMAL HIGH (ref 4.0–10.5)
nRBC: 0 % (ref 0.0–0.2)

## 2019-12-13 LAB — BASIC METABOLIC PANEL
Anion gap: 10 (ref 5–15)
BUN: 21 mg/dL (ref 8–23)
CO2: 36 mmol/L — ABNORMAL HIGH (ref 22–32)
Calcium: 8.3 mg/dL — ABNORMAL LOW (ref 8.9–10.3)
Chloride: 96 mmol/L — ABNORMAL LOW (ref 98–111)
Creatinine, Ser: 0.68 mg/dL (ref 0.44–1.00)
GFR calc non Af Amer: >60 mL/min (ref >60)
Glucose, Bld: 130 mg/dL — ABNORMAL HIGH (ref 70–99)
Potassium: 3.3 mmol/L — ABNORMAL LOW (ref 3.5–5.1)
Sodium: 142 mmol/L (ref 135–145)

## 2019-12-13 LAB — GLUCOSE, CAPILLARY
Glucose-Capillary: 109 mg/dL — ABNORMAL HIGH (ref 70–99)
Glucose-Capillary: 291 mg/dL — ABNORMAL HIGH (ref 70–99)
Glucose-Capillary: 379 mg/dL — ABNORMAL HIGH (ref 70–99)
Glucose-Capillary: 354 mg/dL — ABNORMAL HIGH (ref 70–99)

## 2019-12-13 LAB — FIBRIN DERIVATIVES D-DIMER (ARMC ONLY): Fibrin derivatives D-dimer (ARMC): 805.94 ng/mL (FEU) — ABNORMAL HIGH (ref 0.00–499.00)

## 2019-12-13 LAB — APTT: aPTT: 28 seconds (ref 24–36)

## 2019-12-13 LAB — PROTIME-INR
INR: 1 (ref 0.8–1.2)
Prothrombin Time: 12.6 seconds (ref 11.4–15.2)

## 2019-12-13 MED ORDER — INSULIN ASPART 100 UNIT/ML ~~LOC~~ SOLN
0.0000 [IU] | Freq: Three times a day (TID) | SUBCUTANEOUS | Status: DC
  Administered 2019-12-14: 13:00:00 4 [IU] via SUBCUTANEOUS
  Administered 2019-12-14: 7 [IU] via SUBCUTANEOUS
  Filled 2019-12-13 (×2): qty 1

## 2019-12-13 MED ORDER — ORAL CARE MOUTH RINSE
15.0000 mL | Freq: Two times a day (BID) | OROMUCOSAL | Status: DC
  Administered 2019-12-13 – 2019-12-14 (×2): 15 mL via OROMUCOSAL

## 2019-12-13 NOTE — Progress Notes (Signed)
Physical Therapy Treatment Patient Details Name: Grace Short MRN: [id number] DOB: [birth year] Today's Date: 12/13/2019    History of Present Illness Patient is a 74 year old female admitted for evaluation of weakness with fall at home. Found to have Covid 19 with acute respiratory failure with hypoxemia  with history of COPD and home oxygen use. Increased confusion with baseline dementia. Chronic lymphedema with venous insufficiency related to HF with stasis ulcers with unna boot therapy. Patient is typically ambulatory with a walker at home.    PT Comments    Patient received in bed, agrees to PT session. Requires min assist for bed exercises, mod +2 assist for supine to sit. Assist to raise trunk and bring LEs on/off side of bed. She is able to sit with supervision at edge of bed. Sit to stand with min guard +2. She is able to take a few side steps along edge of bed with RW and min guard +2. She will continue to benefit from skilled PT while here to improve strength and functional independence to return to PFL.      Follow Up Recommendations  SNF     Equipment Recommendations  None recommended by PT;Other (comment) (TBD)    Recommendations for Other Services       Precautions / Restrictions Precautions Precautions: Fall Restrictions Weight Bearing Restrictions: No    Mobility  Bed Mobility Overal bed mobility: Needs Assistance Bed Mobility: Supine to Sit;Sit to Supine     Supine to sit: Mod assist;+2 for physical assistance Sit to supine: Mod assist;+2 for physical assistance   General bed mobility comments: good effort today with heavy use of rails  Transfers Overall transfer level: Needs assistance Equipment used: Rolling walker (2 wheeled) Transfers: Sit to/from Stand Sit to Stand: Min guard;+2 physical assistance         General transfer comment: patient did very well with STS. Min guard +2 assist.  Ambulation/Gait Ambulation/Gait assistance: Min guard Gait  Distance (Feet): 2 Feet Assistive device: Rolling walker (2 wheeled) Gait Pattern/deviations: Step-to pattern;Decreased stride length Gait velocity: decreased   General Gait Details: patient able to take a few side steps along edge of bed. Min guard +2. I feel like she could have walked a few feet to recliner, but she delcined.   Stairs             Wheelchair Mobility    Modified Rankin (Stroke Patients Only)       Balance Overall balance assessment: Needs assistance Sitting-balance support: Feet supported Sitting balance-Leahy Scale: Good Sitting balance - Comments: patient able to balance at edge of bed without assistance   Standing balance support: Bilateral upper extremity supported;During functional activity Standing balance-Leahy Scale: Fair Standing balance comment: heavy reliance on walker, +2 min guard                            Cognition Arousal/Alertness: Awake/alert Behavior During Therapy: WFL for tasks assessed/performed Overall Cognitive Status: Within Functional Limits for tasks assessed                                 General Comments: patient is able to follow all single step commands without difficulty.      Exercises Other Exercises Other Exercises: B LE exercises: ap, heel slides, hip abd/add, SLR x 5-10 with assist as needed    General Comments  Pertinent Vitals/Pain Pain Assessment: Faces Faces Pain Scale: Hurts a little bit Pain Location: R leg Pain Descriptors / Indicators: Discomfort Pain Intervention(s): Monitored during session    Home Living                      Prior Function            PT Goals (current goals can now be found in the care plan section) Acute Rehab PT Goals Patient Stated Goal: to get stronger  PT Goal Formulation: With patient Time For Goal Achievement: 12/22/19 Potential to Achieve Goals: Good Progress towards PT goals: Progressing toward goals     Frequency    Min 2X/week      PT Plan Current plan remains appropriate    Co-evaluation              AM-PAC PT "6 Clicks" Mobility   Outcome Measure  Help needed turning from your back to your side while in a flat bed without using bedrails?: A Lot Help needed moving from lying on your back to sitting on the side of a flat bed without using bedrails?: A Lot Help needed moving to and from a bed to a chair (including a wheelchair)?: A Little Help needed standing up from a chair using your arms (e.g., wheelchair or bedside chair)?: A Little Help needed to walk in hospital room?: A Lot Help needed climbing 3-5 steps with a railing? : Total 6 Click Score: 13    End of Session Equipment Utilized During Treatment: Oxygen;Gait belt Activity Tolerance: Patient limited by fatigue Patient left: in bed;with call bell/phone within reach Nurse Communication: Mobility status PT Visit Diagnosis: Unsteadiness on feet (R26.81);Muscle weakness (generalized) (M62.81);Difficulty in walking, not elsewhere classified (R26.2)     Time: 1120-1150 PT Time Calculation (min) (ACUTE ONLY): 30 min  Charges:  $Therapeutic Exercise: 8-22 mins $Therapeutic Activity: 8-22 mins                     Brason Berthelot, PT, GCS 12/13/19,1:06 PM

## 2019-12-13 NOTE — Progress Notes (Signed)
Cross Cover Note  RN repots large amount of blood with visible clots noted draining down legs earlier this evening.  Nurse unable to differentiate origin of blood to be from urinary tract or vaginal  Bedside assessment there was bright red blood present in the area. After cleansing and evaluating, origin of bleeding did not appear to be vaginal, but no blood flow from urinary meatus was noted at the time either.  Exam was limited from lack of appropriate exam light and equipment.  CBC was obtained and is stable. It is possible that renal stone noted on prior ultrasound  Moved, causing urinary tract hemorrhage, however patient denies pain that would be expected with stone migration.  A repeat renal ultrasound may be beneficial should such large amounts of bleeding continue

## 2019-12-13 NOTE — Progress Notes (Signed)
PROGRESS NOTE    Grace Short  HEN:[id number] DOB: [birth year] DOA: 12/07/2019 PCP: Shane Crutch, PA   Chief Complain:  Brief Narrative: Patient is a 67 female with history of obesity, OSA, sick sinus syndrome status post pacemaker, diabetes 2, hypertension, hyperlipidemia, chronic bilateral lower extremity lymphedema, chronic heart failure with preserved ejection fraction who presents to the emergency department with complaints of confusion.  Patient is on oxygen at home at 2 to 3 L but has not been oxygen oxygen for last 2 years.  She was also complaining of generalized weakness, bilateral leg swelling.  She was confused on presentation.  On presentation, she was hypoxic on room air needing 3 L of oxygen.  Covid screen tested positive.  Started on remdesivir, Solu-Medrol.  Patient seen by PT/OT and recommended skilled nursing facility.  Overall condition is improving.  DC following for placement.  Patient is medically stable for discharge to skilled nursing facility as soon as bed is available.  Assessment & Plan:   Active Problems:   Chronic heart failure with preserved ejection fraction (HFpEF) (HCC)   Edema of both lower extremities due to peripheral venous insufficiency   Stasis edema with ulcer and inflammation, bilateral (HCC)   SSS (sick sinus syndrome) (HCC)   Pneumonia due to COVID-19 virus   HTN (hypertension)   COPD (chronic obstructive pulmonary disease) (HCC)   Acute respiratory failure with hypoxemia (HCC)   Lymphedema   Type 2 diabetes mellitus with hyperlipidemia (HCC)   Morbid obesity with BMI of 50.0-59.9, adult (HCC)   Weakness   Hematuria   1. Acute hypoxic respiratory failure :secondary to COVID-19 pneumonia. Initial pulse ox low at 87 to 88% on room air.  Currently on 2 L which is her baseline and she is maintaining her saturation. She completed the course of remdesivir. Switched over to prednisone. Continue baricitinib with CRP initially being high. CRP  trending down. 2. Hematuria.  No complaint of dysuria.    CT renal stone study showed 3 mm nonobstructing stone in the lower right kidney.  Urology recommended outpatient follow-up.  UA is not grossly suggestive of UTI.  We will follow-up urine culture.  Lovenox was held for hematuria.  Urine is clear now. 3. Chronic diastolic congestive heart failure :on Entresto and Toprol-XL.  Will check BMP.  She has bilateral lower extremity chronic edema.  Started Lasix 40 mg daily. 4. Lower extremity weeping edema and chronic lymphedema.  Lasix was held due to diarrhea.Now resumed.  Continue potassium supplementation along with Lasix.  Wound care has been following. 5. Type 2 diabetes mellitus / hyperlipidemia. Continue lantus and novolog. Continue atorvastatin. 6. Morbid obesity: BMI of 57.26 7. COPD: Has bilateral mild expiratory wheezes.  Continue steroids, bronchodilators 8. Weakness. Physical therapy recommends skilled nursing facility placement.  TOC following 9. Hypertension: Added hydralazine.  Continue Entresto, Toprol.  Monitor blood pressure 10.Hypokalemia: Continue potassium supplementation.  Continue to monitor          DVT prophylaxis:SCD Code Status: Full Family Communication: None  Status is: Inpatient  Remains inpatient appropriate because:Unsafe d/c plan   Dispo: The patient is from: Home              Anticipated d/c is to: SNF              Anticipated d/c date is:1 day              Patient currently is medically stable to d/c.  Patient has a bed in skilled  nursing  facility tomorrow.    Consultants: None  Procedures:None  Antimicrobials:  Anti-infectives (From admission, onward)   Start     Dose/Rate Route Frequency Ordered Stop   12/08/19 1000  remdesivir 100 mg in sodium chloride 0.9 % 100 mL IVPB       "Followed by" Linked Group Details   100 mg 200 mL/hr over 30 Minutes Intravenous Daily 12/07/19 1742 12/11/19 0859   12/07/19 1800  remdesivir 200 mg in sodium  chloride 0.9% 250 mL IVPB       "Followed by" Linked Group Details   200 mg 580 mL/hr over 30 Minutes Intravenous Once 12/07/19 1742 12/07/19 1948      Subjective:  Patient seen and examined at the bedside this morning.  Comfortable.  No active issues.  Urine is clear this morning.   Objective: Vitals:   12/12/19 1645 12/12/19 2013 12/13/19 0018 12/13/19 0525  BP: (!) 155/72 (!) 157/66 (!) 133/47 (!) 147/55  Pulse: 75 67 70 70  Resp: 18 18 18 18   Temp: 99 F (37.2 C) 98.6 F (37 C) 98.4 F (36.9 C) 98.6 F (37 C)  TempSrc: Oral Oral Oral Oral  SpO2: 97% 98% 97% 96%  Weight:      Height:        Intake/Output Summary (Last 24 hours) at 12/13/2019 0737 Last data filed at 12/13/2019 02/12/2020 Gross per 24 hour  Intake --  Output 2400 ml  Net -2400 ml   Filed Weights   12/08/19 1149  Weight: (!) 142 kg    Examination:  General exam: Comfortable, generalized weakness, morbid obesity HEENT:PERRL,Oral mucosa moist, Ear/Nose normal on gross exam Respiratory system: No wheezes or crackles  cardiovascular system: S1 & S2 heard, RRR. No JVD, murmurs, rubs, gallops or clicks. Gastrointestinal system: Abdomen is nondistended, soft and nontender. No organomegaly or masses felt. Normal bowel sounds heard. Central nervous system: Alert and oriented. No focal neurological deficits. Extremities: Bilateral lower extremity edema, wrapped with dressings, no clubbing ,no cyanosis Skin: No rashes, lesions or ulcers,no icterus ,no pallor   Data Reviewed: I have personally reviewed following labs and imaging studies  CBC: Recent Labs  Lab 12/07/19 1340 12/11/19 0535 12/12/19 1306  WBC 6.8 6.0 6.9  NEUTROABS 5.7  --  5.8  HGB 12.4 14.0 14.5  HCT 38.8 43.8 46.2*  MCV 80.2 79.6* 80.5  PLT 175 176 163   Basic Metabolic Panel: Recent Labs  Lab 12/07/19 1337 12/07/19 1340 12/09/19 0453 12/10/19 0421 12/11/19 0535 12/12/19 0452 12/13/19 0430  NA  --    < > 141 142 143 144 142   K  --    < > 4.2 3.8 3.2* 3.1* 3.3*  CL  --    < > 102 102 100 99 96*  CO2  --    < > 31 32 34* 34* 36*  GLUCOSE  --    < > 289* 249* 140* 68* 130*  BUN  --    < > 23 23 23 20 21   CREATININE  --    < > 0.80 0.79 0.78 0.76 0.68  CALCIUM  --    < > 8.5* 8.6* 8.4* 8.1* 8.3*  MG 1.8  --   --   --   --   --   --    < > = values in this interval not displayed.   GFR: Estimated Creatinine Clearance: 84.6 mL/min (by C-G formula based on SCr of 0.68 mg/dL). Liver Function Tests: Recent  Labs  Lab 12/08/19 0422 12/09/19 0453 12/10/19 0421 12/11/19 0535 12/12/19 0452  AST 43* 34 27 24 24   ALT 18 19 17 17 18   ALKPHOS 51 42 43 41 43  BILITOT 0.7 0.5 0.4 0.6 0.8  PROT 7.4 6.8 6.5 6.4* 6.2*  ALBUMIN 3.2* 2.8* 2.7* 2.8* 2.7*   No results for input(s): LIPASE, AMYLASE in the last 168 hours. No results for input(s): AMMONIA in the last 168 hours. Coagulation Profile: No results for input(s): INR, PROTIME in the last 168 hours. Cardiac Enzymes: No results for input(s): CKTOTAL, CKMB, CKMBINDEX, TROPONINI in the last 168 hours. BNP (last 3 results) No results for input(s): PROBNP in the last 8760 hours. HbA1C: No results for input(s): HGBA1C in the last 72 hours. CBG: Recent Labs  Lab 12/12/19 0738 12/12/19 0848 12/12/19 1142 12/12/19 1644 12/12/19 2153  GLUCAP 55* 100* 202* 276* 221*   Lipid Profile: No results for input(s): CHOL, HDL, LDLCALC, TRIG, CHOLHDL, LDLDIRECT in the last 72 hours. Thyroid Function Tests: No results for input(s): TSH, T4TOTAL, FREET4, T3FREE, THYROIDAB in the last 72 hours. Anemia Panel: No results for input(s): VITAMINB12, FOLATE, FERRITIN, TIBC, IRON, RETICCTPCT in the last 72 hours. Sepsis Labs: Recent Labs  Lab 12/07/19 1337 12/07/19 1340  PROCALCITON  --  0.59  LATICACIDVEN 1.5  --     Recent Results (from the past 240 hour(s))  Respiratory Panel by RT PCR (Flu A&B, Covid) - Nasopharyngeal Swab     Status: Abnormal   Collection Time:  12/07/19  1:37 PM   Specimen: Nasopharyngeal Swab  Result Value Ref Range Status   SARS Coronavirus 2 by RT PCR POSITIVE (A) NEGATIVE Final    Comment: RESULT CALLED TO, READ BACK BY AND VERIFIED WITH: AMBER REDAMCE @ 1516 ON 12/07/2019 BY CA[facility] (NOTE) SARS-CoV-2 target nucleic acids are DETECTED.  SARS-CoV-2 RNA is generally detectable in upper respiratory specimens  during the acute phase of infection. Positive results are indicative of the presence of the identified virus, but do not rule out bacterial infection or co-infection with other pathogens not detected by the test. Clinical correlation with patient history and other diagnostic information is necessary to determine patient infection status. The expected result is Negative.  Fact Sheet for Patients:  02/06/20  Fact Sheet for Healthcare Providers: 02/06/2020  This test is not yet approved or cleared by the https://www.moore.com/ FDA and  has been authorized for detection and/or diagnosis of SARS-CoV-2 by FDA under an Emergency Use Authorization (EUA).  This EUA will remain in effect (meaning this test  can be used) for the duration of  the COVID-19 declaration under Section 564(b)(1) of the Act, 21 U.S.C. section 360bbb-3(b)(1), unless the authorization is terminated or revoked sooner.      Influenza A by PCR NEGATIVE NEGATIVE Final   Influenza B by PCR NEGATIVE NEGATIVE Final    Comment: (NOTE) The Xpert Xpress SARS-CoV-2/FLU/RSV assay is intended as an aid in  the diagnosis of influenza from Nasopharyngeal swab specimens and  should not be used as a sole basis for treatment. Nasal washings and  aspirates are unacceptable for Xpert Xpress SARS-CoV-2/FLU/RSV  testing.  Fact Sheet for Patients: https://www.young.biz/  Fact Sheet for Healthcare Providers: [place]  This test is not yet approved or cleared by  the https://www.moore.com/ FDA and  has been authorized for detection and/or diagnosis of SARS-CoV-2 by  FDA under an Emergency Use Authorization (EUA). This EUA will remain  in effect (meaning this test can be  used) for the duration of the  Covid-19 declaration under Section 564(b)(1) of the Act, 21  U.S.C. section 360bbb-3(b)(1), unless the authorization is  terminated or revoked. Performed at [facility], [address] [place], [place] [address]          Radiology Studies: No results found.      Scheduled Meds: . atorvastatin  20 mg Oral Daily  . baricitinib  4 mg Oral Daily  . furosemide  40 mg Oral Daily  . hydrALAZINE  50 mg Oral Q8H  . insulin aspart  0-15 Units Subcutaneous TID WC  . insulin aspart  0-5 Units Subcutaneous QHS  . insulin aspart  7 Units Subcutaneous TID WC  . insulin glargine  20 Units Subcutaneous BID  . levETIRAcetam  750 mg Oral BID  . metoprolol succinate  25 mg Oral Daily  . potassium chloride  40 mEq Oral Daily  . predniSONE  50 mg Oral Daily  . sacubitril-valsartan  1 tablet Oral BID  . senna  1 tablet Oral BID   Continuous Infusions: . sodium chloride       LOS: 6 days    Time spent: 25 mins.More than 50% of that time was spent in counseling and/or coordination of care.      Burnadette PopAmrit Aniyla Harling, MD [facility] P10/10/2019, 7:37 AM

## 2019-12-13 NOTE — TOC Progression Note (Signed)
Transition of Care (TOC) - Progression Note    Patient Details  Name: Grace Short MRN: [id number] Date of Birth: 09-06-1945  Transition of Care [facility]) CM/SW Contact  Allayne Butcher, RN Phone Number: 12/13/2019, 9:41 AM  Clinical Narrative:    Patient will discharge to [facility] tomorrow, bed will be ready early, confirmed with [facility].  TOC team to contact Kirsten at [phone] for room assignment.     Expected Discharge Plan: Skilled Nursing Facility Barriers to Discharge: No SNF bed  Expected Discharge Plan and Services Expected Discharge Plan: Skilled Nursing Facility   Discharge Planning Services: CM Consult Post Acute Care Choice: Skilled Nursing Facility Living arrangements for the past 2 months: Single Family Home                           HH Arranged: NA           Social Determinants of Health (SDOH) Interventions    Readmission Risk Interventions No flowsheet data found.

## 2019-12-13 NOTE — Progress Notes (Signed)
Per Mrya in pharmacy ok to give tonight's Lantus at it's scheduled time.

## 2019-12-13 NOTE — Care Management Important Message (Signed)
Important Message  Patient Details  Name: Grace Short. [place] MRN: [id number] Date of Birth: [birth year]   Medicare Important Message Given:  Yes  Reviewed with patient via room phone due to isolation status.     [name] 12/13/2019, 10:24 AM

## 2019-12-14 ENCOUNTER — Inpatient Hospital Stay: Payer: Medicare HMO

## 2019-12-14 LAB — TYPE AND SCREEN
ABO/RH(D): A POS
Antibody Screen: NEGATIVE

## 2019-12-14 LAB — URINE CULTURE

## 2019-12-14 LAB — GLUCOSE, CAPILLARY
Glucose-Capillary: 54 mg/dL — ABNORMAL LOW (ref 70–99)
Glucose-Capillary: 65 mg/dL — ABNORMAL LOW (ref 70–99)
Glucose-Capillary: 179 mg/dL — ABNORMAL HIGH (ref 70–99)
Glucose-Capillary: 178 mg/dL — ABNORMAL HIGH (ref 70–99)
Glucose-Capillary: 202 mg/dL — ABNORMAL HIGH (ref 70–99)
Glucose-Capillary: 139 mg/dL — ABNORMAL HIGH (ref 70–99)

## 2019-12-14 LAB — FIBRIN DERIVATIVES D-DIMER (ARMC ONLY): Fibrin derivatives D-dimer (ARMC): 906.05 ng/mL (FEU) — ABNORMAL HIGH (ref 0.00–499.00)

## 2019-12-14 MED ORDER — POTASSIUM CHLORIDE CRYS ER 20 MEQ PO TBCR: 40.0000 meq | EXTENDED_RELEASE_TABLET | Freq: Every day | ORAL | Status: DC

## 2019-12-14 MED ORDER — GUAIFENESIN-DM 100-10 MG/5ML PO SYRP: 10.0000 mL | ORAL_SOLUTION | ORAL | 0 refills | Status: DC | PRN

## 2019-12-14 MED ORDER — HYDRALAZINE HCL 50 MG PO TABS: 50.0000 mg | ORAL_TABLET | Freq: Three times a day (TID) | ORAL | Status: DC

## 2019-12-14 MED ORDER — PREDNISONE 10 MG PO TABS: 10.0000 mg | ORAL_TABLET | Freq: Every day | ORAL | 0 refills | Status: DC

## 2019-12-14 MED ORDER — ALBUTEROL SULFATE HFA 108 (90 BASE) MCG/ACT IN AERS: 2.0000 | INHALATION_SPRAY | Freq: Once | RESPIRATORY_TRACT | Status: DC | PRN

## 2019-12-14 MED ORDER — FUROSEMIDE 40 MG PO TABS: 40.0000 mg | ORAL_TABLET | Freq: Every day | ORAL | Status: DC

## 2019-12-14 NOTE — Progress Notes (Signed)
MD order received in [facility] to discharge pt to SNF today; TOC previously prepared paperwork; telephone report called to [facility] [phone] spoke with Francesca Jewett; pt's discharge pending arrival of EMS for nonemergency transport on 2L O2 

## 2019-12-14 NOTE — TOC Transition Note (Signed)
Transition of Care (TOC) - CM/SW Discharge Note   Patient Details  Name: Grace Short MRN: [id number] Date of Birth: [birth year]  Transition of Care [facility]) CM/SW Contact:  Maud Deed, LCSW Phone Number: 12/14/2019, 12:31 PM   Clinical Narrative:    Pt is medially stable for discharge per MD. Pt will be transported to [facility] via EMS/[facility]AR. Pt will be going to room 807 on [place] call to report number is [phone]. CSW will arrange transport once nursing staff is ready.    Final next level of care: Skilled Nursing Facility Barriers to Discharge: No Barriers Identified   Patient Goals and CMS Choice Patient states their goals for this hospitalization and ongoing recovery are:: Patient wants to get stronger and be able to walk CMS Medicare.gov Compare Post Acute Care list provided to:: Patient Choice offered to / list presented to : Patient  Discharge Placement              Patient chooses bed at: [facility] Patient to be transferred to facility by: EMS Name of family member notified: Katrina Patient and family notified of of transfer: 12/14/19  Discharge Plan and Services   Discharge Planning Services: CM Consult Post Acute Care Choice: Skilled Nursing Facility                    HH Arranged: NA          Social Determinants of Health (SDOH) Interventions     Readmission Risk Interventions No flowsheet data found.

## 2019-12-14 NOTE — Plan of Care (Signed)

## 2019-12-14 NOTE — Discharge Summary (Addendum)
Physician Discharge Summary  Grace Short ZOX:[id number] DOB: 07-07-1945 DOA: 12/07/2019  PCP: Shane Crutch, PA  Admit date: 12/07/2019 Discharge date: 12/14/2019  Admitted From: Home Disposition:  Home  Discharge Condition:Stable CODE STATUS:FULL Diet recommendation: Heart Healthy   Brief/Interim Summary:  Patient is a 1 female with history of obesity, OSA, sick sinus syndrome status post pacemaker, diabetes 2, hypertension, hyperlipidemia, chronic bilateral lower extremity lymphedema, chronic heart failure with preserved ejection fraction who presents to the emergency department with complaints of confusion.  Patient is on oxygen at home at 2 to 3 L but has not been oxygen oxygen for last 2 years.  She was also complaining of generalized weakness, bilateral leg swelling. On presentation, she was confused and  hypoxic on room air needing 3 L of oxygen.  Covid screen tested positive.  Started on remdesivir, Solu-Medrol.  Hospital course was also remarkable for hematuria for which urology recommended outpatient follow-up. Patient seen by PT/OT and recommended skilled nursing facility.  Overall condition is improving.  DC following for placement.  Patient is medically stable for discharge to skilled nursing facility .  Discharge Diagnoses:  Active Problems:   Chronic heart failure with preserved ejection fraction (HFpEF) (HCC)   Edema of both lower extremities due to peripheral venous insufficiency   Stasis edema with ulcer and inflammation, bilateral (HCC)   SSS (sick sinus syndrome) (HCC)   Pneumonia due to COVID-19 virus   HTN (hypertension)   COPD (chronic obstructive pulmonary disease) (HCC)   Acute respiratory failure with hypoxemia (HCC)   Lymphedema   Type 2 diabetes mellitus with hyperlipidemia (HCC)   Morbid obesity with BMI of 50.0-59.9, adult (HCC)   Weakness   Hematuria   1. Acute hypoxic respiratory failure :secondary to COVID-19 pneumonia. Initial pulse ox low at 87 to  88% on room air.  Currently on 2 L which is her baseline and she is maintaining her saturation. She completed the course of remdesivir. Switched over to prednisone. She was also on  baricitinib with CRP initially being high. CRP trended down. 2. Hematuria:  Hospital course remarkable for episodes of hematuria .No complaint of dysuria. CT renal stone study showed 3 mm nonobstructing stone in the lower right kidney.  Urology recommended outpatient follow-up.  UA is not impressive for  UTI.   Lovenox was held for hematuria.  Urine is clear now.  Her hemoglobin has been stable.  CT and ultrasound of the abdomen did not show any significant finding which can explain gross bleeding/hematuria.  She should follow-up with urology Dr. Richardo Hanks as an outpatient in 1 to 2 weeks.Patient also denied pelvic examination. 3. Chronic diastolic congestive heart failure :on Entresto and Toprol-XL. Marland Kitchen  She has bilateral lower extremity chronic edema.  Started Lasix 40 mg daily.  She has an appointment with cardiology on October 27 4. Lower extremity weeping edema and chronic lymphedema.  Lasix was held due to diarrhea.Now resumed.  Continue potassium supplementation along with Lasix.  Wound care has been following. 5. Type 2 diabetes mellitus / hyperlipidemia. Continue lantus and novolog. Continue atorvastatin.  Monitor blood sugars 6. Morbid obesity: BMI of 57.26 7. COPD: She often has  bilateral mild expiratory wheezes.  Continue steroids, bronchodilators 8. Weakness. Physical therapy recommends skilled nursing facility placement.   9. Hypertension: Added hydralazine.  Continue Entresto, Toprol.  Monitor blood pressure 10.Hypokalemia: Continue potassium supplementation.    Continue 40 mEq of potassium daily for now.  Check BMP in a week, can discontinue potassium after  that if not needed.   Discharge Instructions  Discharge Instructions    Diet - low sodium heart healthy   Complete by: As directed    Discharge  instructions   Complete by: As directed    1)Please take prescribed medications as instructed. 2) DoCBC, BMP tests in a week 3) You have an appointment with cardiology on 01/01/2020 at 10:30 AM. 4)Infection Prevention Recommendations for Individuals Confirmed to have, or Being Evaluated for, 2019 Novel Coronavirus (COVID-19) Infection Who Receive Care at Home  Individuals who are confirmed to have, or are being evaluated for, COVID-19 should follow the prevention steps below until a healthcare provider or local or state health department says they can return to normal activities.  Stay home except to get medical care You should restrict activities outside your home, except for getting medical care. Do not go to work, school, or public areas, and do not use public transportation or taxis.  Call ahead before visiting your doctor Before your medical appointment, call the healthcare provider and tell them that you have, or are being evaluated for, COVID-19 infection. This will help the healthcare provider's office take steps to keep other people from getting infected. Ask your healthcare provider to call the local or state health department.  Monitor your symptoms Seek prompt medical attention if your illness is worsening (e.g., difficulty breathing). Before going to your medical appointment, call the healthcare provider and tell them that you have, or are being evaluated for, COVID-19 infection. Ask your healthcare provider to call the local or state health department.  Wear a facemask You should wear a facemask that covers your nose and mouth when you are in the same room with other people and when you visit a healthcare provider. People who live with or visit you should also wear a facemask while they are in the same room with you.  Separate yourself from other people in your home As much as possible, you should stay in a different room from other people in your home. Also, you  should use a separate bathroom, if available.  Avoid sharing household items You should not share dishes, drinking glasses, cups, eating utensils, towels, bedding, or other items with other people in your home. After using these items, you should wash them thoroughly with soap and water.  Cover your coughs and sneezes Cover your mouth and nose with a tissue when you cough or sneeze, or you can cough or sneeze into your sleeve. Throw used tissues in a lined trash can, and immediately wash your hands with soap and water for at least 20 seconds or use an alcohol-based hand rub.  Wash your [employer] your hands often and thoroughly with soap and water for at least 20 seconds. You can use an alcohol-based hand sanitizer if soap and water are not available and if your hands are not visibly dirty. Avoid touching your eyes, nose, and mouth with unwashed hands.   Prevention Steps for Caregivers and Household Members of Individuals Confirmed to have, or Being Evaluated for, COVID-19 Infection Being Cared for in the Home  If you live with, or provide care at home for, a person confirmed to have, or being evaluated for, COVID-19 infection please follow these guidelines to prevent infection:  Follow healthcare provider's instructions Make sure that you understand and can help the patient follow any healthcare provider instructions for all care.  Provide for the patient's basic needs You should help the patient with basic needs in the home and  provide support for getting groceries, prescriptions, and other personal needs.  Monitor the patient's symptoms If they are getting sicker, call his or her medical provider and tell them that the patient has, or is being evaluated for, COVID-19 infection. This will help the healthcare provider's office take steps to keep other people from getting infected. Ask the healthcare provider to call the local or state health department.  Limit the  number of people who have contact with the patient If possible, have only one caregiver for the patient. Other household members should stay in another home or place of residence. If this is not possible, they should stay in another room, or be separated from the patient as much as possible. Use a separate bathroom, if available. Restrict visitors who do not have an essential need to be in the home.  Keep older adults, very young children, and other sick people away from the patient Keep older adults, very young children, and those who have compromised immune systems or chronic health conditions away from the patient. This includes people with chronic heart, lung, or kidney conditions, diabetes, and cancer.  Ensure good ventilation Make sure that shared spaces in the home have good air flow, such as from an air conditioner or an opened window, weather permitting.  Wash your hands often Wash your hands often and thoroughly with soap and water for at least 20 seconds. You can use an alcohol based hand sanitizer if soap and water are not available and if your hands are not visibly dirty. Avoid touching your eyes, nose, and mouth with unwashed hands. Use disposable paper towels to dry your hands. If not available, use dedicated cloth towels and replace them when they become wet.  Wear a facemask and gloves Wear a disposable facemask at all times in the room and gloves when you touch or have contact with the patient's blood, body fluids, and/or secretions or excretions, such as sweat, saliva, sputum, nasal mucus, vomit, urine, or feces. Ensure the mask fits over your nose and mouth tightly, and do not touch it during use. Throw out disposable facemasks and gloves after using them. Do not reuse. Wash your hands immediately after removing your facemask and gloves. If your personal clothing becomes contaminated, carefully remove clothing and launder. Wash your hands after handling contaminated  clothing. Place all used disposable facemasks, gloves, and other waste in a lined container before disposing them with other household waste. Remove gloves and wash your hands immediately after handling these items.  Do not share dishes, glasses, or other household items with the patient Avoid sharing household items. You should not share dishes, drinking glasses, cups, eating utensils, towels, bedding, or other items with a patient who is confirmed to have, or being evaluated for, COVID-19 infection. After the person uses these items, you should wash them thoroughly with soap and water.  Wash laundry thoroughly Immediately remove and wash clothes or bedding that have blood, body fluids, and/or secretions or excretions, such as sweat, saliva, sputum, nasal mucus, vomit, urine, or feces, on them. Wear gloves when handling laundry from the patient. Read and follow directions on labels of laundry or clothing items and detergent. In general, wash and dry with the warmest temperatures recommended on the label.  Clean all areas the individual has used often Clean all touchable surfaces, such as counters, tabletops, doorknobs, bathroom fixtures, toilets, phones, keyboards, tablets, and bedside tables, every day. Also, clean any surfaces that may have blood, body fluids, and/or secretions or excretions  on them. Wear gloves when cleaning surfaces the patient has come in contact with. Use a diluted bleach solution (e.g., dilute bleach with 1 part bleach and 10 parts water) or a household disinfectant with a label that says EPA-registered for coronaviruses. To make a bleach solution at home, add 1 tablespoon of bleach to 1 quart (4 cups) of water. For a larger supply, add  cup of bleach to 1 gallon (16 cups) of water. Read labels of cleaning products and follow recommendations provided on product labels. Labels contain instructions for safe and effective use of the cleaning product including precautions  you should take when applying the product, such as wearing gloves or eye protection and making sure you have good ventilation during use of the product. Remove gloves and wash hands immediately after cleaning.  Monitor yourself for signs and symptoms of illness Caregivers and household members are considered close contacts, should monitor their health, and will be asked to limit movement outside of the home to the extent possible. Follow the monitoring steps for close contacts listed on the symptom monitoring form.   Discharge wound care:   Complete by: As directed    As per wound nurse   Increase activity slowly   Complete by: As directed      Allergies as of 12/14/2019   No Known Allergies     Medication List    TAKE these medications   albuterol 108 (90 Base) MCG/ACT inhaler Commonly known as: VENTOLIN HFA Inhale 2 puffs into the lungs once as needed for wheezing or shortness of breath (for Grade 3 or 4 hypersensitivity reaction).   atorvastatin 20 MG tablet Commonly known as: LIPITOR Take 20 mg by mouth daily.   dorzolamide-timolol 22.3-6.8 MG/ML ophthalmic solution Commonly known as: COSOPT Place 1 drop into both eyes 2 (two) times daily.   Entresto 49-51 MG Generic drug: sacubitril-valsartan Take 1 tablet by mouth 2 (two) times daily.   furosemide 40 MG tablet Commonly known as: LASIX Take 1 tablet (40 mg total) by mouth daily.   guaiFENesin-dextromethorphan 100-10 MG/5ML syrup Commonly known as: ROBITUSSIN DM Take 10 mLs by mouth every 4 (four) hours as needed for cough.   hydrALAZINE 50 MG tablet Commonly known as: APRESOLINE Take 1 tablet (50 mg total) by mouth every 8 (eight) hours.   insulin glargine 100 UNIT/ML injection Commonly known as: LANTUS Inject 20 Units into the skin 2 (two) times daily.   insulin lispro 100 UNIT/ML injection Commonly known as: HUMALOG Inject 10 Units into the skin See admin instructions. Inject 10u under the skin three  times daily at mealtimes (plus applicable sliding scale) - do not use if blood glucose <150   levETIRAcetam 750 MG tablet Commonly known as: KEPPRA Take 750 mg by mouth 2 (two) times daily.   metoprolol succinate 25 MG 24 hr tablet Commonly known as: TOPROL-XL Take 25 mg by mouth daily.   potassium chloride SA 20 MEQ tablet Commonly known as: KLOR-CON Take 2 tablets (40 mEq total) by mouth daily.   predniSONE 10 MG tablet Commonly known as: DELTASONE Take 1 tablet (10 mg total) by mouth daily. Take 4 pills daily for 3 days then 2 pills daily for 3 days then 1 pill daily for 3 days then stop.            Discharge Care Instructions  (From admission, onward)         Start     Ordered   12/14/19 0000  Discharge wound  care:       Comments: As per wound nurse   12/14/19 0753          Contact information for follow-up providers    [facility] Follow up on 01/01/2020.   Specialty: Cardiology Why: at 10:30am. Enter through the [facility] entrance Contact information: [address] [place] [address] [phone]           Contact information for after-discharge care    Destination    [facility] .   Service: Skilled Nursing Contact information: [address] [place] [address] [phone]                 No Known Allergies  Consultations:  None   Procedures/Studies: DG Chest 1 View  Result Date: 12/07/2019 CLINICAL DATA:  Status post fall x2 today. EXAM: CHEST  1 VIEW COMPARISON:  None. FINDINGS: Pacing device in place. Heart size is normal. Lungs clear. No pneumothorax or pleural fluid. No acute or focal bony abnormality. IMPRESSION: No acute disease. Electronically Signed   By: Drusilla Kanner M.D.   On: 12/07/2019 14:57   DG Pelvis 1-2 Views  Result Date: 12/07/2019 CLINICAL DATA:  Pelvic pain after a fall today.  Initial encounter.  EXAM: PELVIS - 1-2 VIEW COMPARISON:  None. FINDINGS: There is no evidence of pelvic fracture or diastasis. No pelvic bone lesions are seen. Lower lumbar degenerative change and mild to moderate osteoarthritis about the hips noted. IMPRESSION: No acute abnormality. Electronically Signed   By: Drusilla Kanner M.D.   On: 12/07/2019 14:59   DG Knee 2 Views Left  Result Date: 12/07/2019 CLINICAL DATA:  Status post fall today.  Right knee pain. EXAM: LEFT KNEE - 1-2 VIEW COMPARISON:  None. FINDINGS: No acute bony or joint abnormality. Mild to moderate degenerative change about the knee noted. No joint effusion is seen. No chondrocalcinosis. IMPRESSION: No acute abnormality. Mild to moderate appearing osteoarthritis. Electronically Signed   By: Drusilla Kanner M.D.   On: 12/07/2019 14:57   DG Knee 2 Views Right  Result Date: 12/07/2019 CLINICAL DATA:  Status post fall today.  Initial encounter. EXAM: RIGHT KNEE - 1-2 VIEW COMPARISON:  None. FINDINGS: There is no acute bony or joint abnormality. Tricompartmental degenerative disease is worst medially. No joint effusion or chondrocalcinosis. IMPRESSION: No acute abnormality. 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 Electronically Signed   By: Drusilla Kanner M.D.   On: 12/07/2019 14:58   CT Head Wo Contrast  Result Date: 12/07/2019 CLINICAL DATA:  Mental status change.  Falls.  Weakness. EXAM: CT HEAD WITHOUT CONTRAST TECHNIQUE: Contiguous axial images were obtained from the base of the skull through the vertex without intravenous contrast. COMPARISON:  None. FINDINGS: Brain: No evidence of acute large vascular territory infarction, hemorrhage, hydrocephalus, extra-axial collection or mass lesion/mass effect. Patchy white matter hypoattenuation, which is nonspecific, but most likely secondary to chronic microvascular ischemic disease. Mild diffuse cerebral volume loss. Vascular: Calcific intracranial atherosclerosis. Skull: No acute  fracture. Sinuses/Orbits: Mild right sphenoid sinus mucosal thickening. Otherwise, the sinuses are clear. No acute orbital abnormality. Other: No mastoid effusion. IMPRESSION: 1. No evidence of acute intracranial abnormality. 2. 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 Electronically Signed   By: Feliberto Harts MD   On: 12/07/2019 14:29   CT ANGIO CHEST PE W OR WO CONTRAST  Result Date: 12/07/2019 CLINICAL DATA:  COVID pneumonia, weakness EXAM: CT ANGIOGRAPHY CHEST WITH  CONTRAST TECHNIQUE: Multidetector CT imaging of the chest was performed using the standard protocol during bolus administration of intravenous contrast. Multiplanar CT image reconstructions and MIPs were obtained to evaluate the vascular anatomy. CONTRAST:  OMNIPAQUE IOHEXOL 350 MG/ML SOLN COMPARISON:  Chest radiograph dated 12/07/2019 FINDINGS: Cardiovascular: Satisfactory opacification the bilateral pulmonary arteries to the segmental level. Evaluation of the bilateral lower lobes is mildly constrained by respiratory motion. Within that constraint, there is no evidence of pulmonary embolism. Although not tailored for evaluation of the thoracic aorta, there is no evidence of thoracic aortic aneurysm or dissection. Mild cardiomegaly. No pericardial effusion. Left subclavian pacemaker. Coronary atherosclerosis of the LAD and right coronary artery. Mediastinum/Nodes: No suspicious mediastinal lymphadenopathy. Visualized thyroid is unremarkable. Lungs/Pleura: Evaluation of the lung parenchyma is constrained by respiratory motion. Within that constraint, there are no suspicious pulmonary nodules. Scattered lung cysts. Mild linear/patchy left lower lobe opacity (series 6/image 60), possibly reflecting pneumonia in this patient with known COVID. Mild linear atelectasis in the medial right middle lobe. No pleural effusion or pneumothorax. Upper Abdomen: Visualized upper abdomen is notable for numerous layering gallstones (series 4/image  88), without associated inflammatory changes. Mild vascular calcifications. Musculoskeletal: Degenerative changes of the visualized thoracolumbar spine. No rib fracture is seen. Review of the MIP images confirms the above findings. IMPRESSION: No evidence of pulmonary embolism. Possible mild left lower lobe pneumonia in this patient with known COVID. Cholelithiasis, without associated inflammatory changes. Aortic Atherosclerosis (ICD10-I70.0). Electronically Signed   By: Charline Bills M.D.   On: 12/07/2019 18:45   US RENAL  Result Date: 12/10/2019 CLINICAL DATA:  73 year old female with gross hematuria. Positive COVID-19. EXAM: RENAL / URINARY TRACT ULTRASOUND COMPLETE COMPARISON:  Chest CTA 12/07/2019. FINDINGS: Right Kidney: Renal measurements: 10.5 x 4.8 x 4.9 cm = volume: 128 mL. Preserved cortical echogenicity. No hydronephrosis. Right renal midpole cyst measuring about 3 cm diameter, also suggested on the recent CTA. This appears simple/benign with no internal vascular elements on brief Doppler (image 18). Left Kidney: Renal measurements: 10.4 x 5.2 x 5.4 cm = volume: 151 mL. Preserved cortical echogenicity. No hydronephrosis. Possible 7-8 mm renal calculus on image 32. Bladder: Diminutive, unremarkable. Other: Shadowing gravel-like gallstones redemonstrated on image 7. IMPRESSION: 1. Questionable left nephrolithiasis but otherwise normal for age ultrasound appearance of both kidneys. No solid renal mass or evidence of obstructive uropathy. 2. Cholelithiasis. Electronically Signed   By: Odessa Fleming M.D.   On: 12/10/2019 14:21   DG Chest Port 1 View  Result Date: 12/10/2019 CLINICAL DATA:  74 year old female positive COVID-19. Increased cough. EXAM: PORTABLE CHEST 1 VIEW COMPARISON:  Chest CTA 12/07/2019 and earlier. FINDINGS: Portable AP upright view at 0934 hours. Low normal lung volumes. Mediastinal contours remain normal. Stable left chest cardiac pacemaker. E minor asymmetric streaky opacity,  appears at the left lung stable to base the recent left lower lobe CTA opacity. Lsewhere allowing for portable technique the lungs are clear. Visualized tracheal air column is within normal limits. No pneumothorax or pleural effusion. No acute osseous abnormality identified. IMPRESSION: Stable mild left lung base opacity from the CTA on 12/07/2019. No new cardiopulmonary abnormality. Electronically Signed   By: Odessa Fleming M.D.   On: 12/10/2019 10:05   CT RENAL STONE STUDY  Result Date: 12/10/2019 CLINICAL DATA:  Hematuria. EXAM: CT ABDOMEN AND PELVIS WITHOUT CONTRAST TECHNIQUE: Multidetector CT imaging of the abdomen and pelvis was performed following the standard protocol without IV contrast. COMPARISON:  Renal ultrasound earlier today. FINDINGS: Lower chest:  Cardiomegaly, pacemaker partially included. Streaky and ground-glass opacities in the lung bases, not significantly changed from chest CT 3 days ago. Trace left pleural thickening. Hepatobiliary: Prominent liver spanning 18.4 cm cranial caudal. No evidence of focal lesion. High-density material in the gallbladder may represent gallstones or vicarious excretion of IV contrast from prior chest CT. There is no pericholecystic inflammation. Pancreas: Mild fatty atrophy.  No ductal dilatation or inflammation. Spleen: Normal in size. Small ill-defined low-density lesions in the spleen are too small to accurately characterize. Adrenals/Urinary Tract: No adrenal nodule. 3 mm calcification in the lower right kidney favor nonobstructing stone over vascular calcifications. There is a simple 2.9 cm cyst posteriorly in the right kidney. No hydronephrosis of either kidney. There is mild symmetric perinephric edema. No ureteral calculi. Nondistended urinary bladder without wall thickening. No bladder stone. Stomach/Bowel: Decompressed stomach. There is no small bowel obstruction or inflammation. Enteric chain sutures noted in small bowel in the lower abdomen. Appendix is  not confidently visualized. Moderate stool in the ascending colon with air-filled transverse colon. Gaseous distension of loop of redundant sigmoid colon without wall thickening. No colonic wall thickening. No evidence of colonic mass. Vascular/Lymphatic: Mild aortic atherosclerosis. No aortic aneurysm. There is no bulky abdominopelvic adenopathy. Reproductive: Multiple uterine fibroids many of which are calcified. Ovaries are not discretely visualized. There is no suspicious adnexal mass. Other: No ascites. No free air. Postsurgical change of the anterior abdominal wall with abdominal muscle laxity. Musculoskeletal: Grade 1 anterolisthesis of L4 on L5 is likely degenerative and facet mediated. There is facet hypertrophy from L3-L4 through L5-S1. Multilevel degenerative disc disease. Scattered sclerotic foci throughout the pelvis typical of bone [place] IMPRESSION: 1. Nonobstructing 3 mm stone in the lower right kidney. No hydronephrosis or obstructive uropathy. 2. Mild symmetric perinephric edema is nonspecific, may be chronic or seen with urinary tract infection. Recommend correlation with urinalysis. 3. Multiple uterine fibroids. 4. High-density material in the gallbladder may represent gallstones or vicarious excretion of IV contrast from prior chest CT. No pericholecystic inflammation. Aortic Atherosclerosis (ICD10-I70.0). Electronically Signed   By: Narda RutherfordMelanie  Sanford M.D.   On: 12/10/2019 20:27       Subjective: Patient seen and examined at the bedside this morning.  Hemodynamically stable for discharge today.  Discharge Exam: Vitals:   12/14/19 0001 12/14/19 0531  BP: (!) 160/68 (!) 135/39  Pulse: 73 92  Resp: 18 18  Temp: 98.1 F (36.7 C) 98.3 F (36.8 C)  SpO2: 97% 96%   Vitals:   12/13/19 1719 12/13/19 1937 12/14/19 0001 12/14/19 0531  BP: (!) 152/67 (!) 151/71 (!) 160/68 (!) 135/39  Pulse: 72 95 73 92  Resp: 20 18 18 18   Temp: 98.2 F (36.8 C) 97.9 F (36.6 C) 98.1 F (36.7 C)  98.3 F (36.8 C)  TempSrc: Oral Oral Oral Oral  SpO2: 99% 93% 97% 96%  Weight:      Height:        General: Pt is alert, awake, not in acute distress Cardiovascular: RRR, S1/S2 +, no rubs, no gallops Respiratory: Diminished air sounds, no wheezing, no rhonchi Abdominal: Soft, NT, ND, bowel sounds + Extremities: Bilateral lower extremity edema, lower extremities wrapped with dressings., no cyanosis    The results of significant diagnostics from this hospitalization (including imaging, microbiology, ancillary and laboratory) are listed below for reference.     Microbiology: Recent Results (from the past 240 hour(s))  Respiratory Panel by RT PCR (Flu A&B, Covid) - Nasopharyngeal Swab  Status: Abnormal   Collection Time: 12/07/19  1:37 PM   Specimen: Nasopharyngeal Swab  Result Value Ref Range Status   SARS Coronavirus 2 by RT PCR POSITIVE (A) NEGATIVE Final    Comment: RESULT CALLED TO, READ BACK BY AND VERIFIED WITH: AMBER REDAMCE @ 1516 ON 12/07/2019 BY CAF (NOTE) SARS-CoV-2 target nucleic acids are DETECTED.  SARS-CoV-2 RNA is generally detectable in upper respiratory specimens  during the acute phase of infection. Positive results are indicative of the presence of the identified virus, but do not rule out bacterial infection or co-infection with other pathogens not detected by the test. Clinical correlation with patient history and other diagnostic information is necessary to determine patient infection status. The expected result is Negative.  Fact Sheet for Patients:  https://www.moore.com/  Fact Sheet for Healthcare Providers: https://www.young.biz/  This test is not yet approved or cleared by the [place] FDA and  has been authorized for detection and/or diagnosis of SARS-CoV-2 by FDA under an Emergency Use Authorization (EUA).  This EUA will remain in effect (meaning this test  can be used) for the duration of  the  COVID-19 declaration under Section 564(b)(1) of the Act, 21 U.S.C. section 360bbb-3(b)(1), unless the authorization is terminated or revoked sooner.      Influenza A by PCR NEGATIVE NEGATIVE Final   Influenza B by PCR NEGATIVE NEGATIVE Final    Comment: (NOTE) The Xpert Xpress SARS-CoV-2/FLU/RSV assay is intended as an aid in  the diagnosis of influenza from Nasopharyngeal swab specimens and  should not be used as a sole basis for treatment. Nasal washings and  aspirates are unacceptable for Xpert Xpress SARS-CoV-2/FLU/RSV  testing.  Fact Sheet for Patients: https://www.moore.com/  Fact Sheet for Healthcare Providers: https://www.young.biz/  This test is not yet approved or cleared by the [place] FDA and  has been authorized for detection and/or diagnosis of SARS-CoV-2 by  FDA under an Emergency Use Authorization (EUA). This EUA will remain  in effect (meaning this test can be used) for the duration of the  Covid-19 declaration under Section 564(b)(1) of the Act, 21  U.S.C. section 360bbb-3(b)(1), unless the authorization is  terminated or revoked. Performed at [facility], [address] [place], [place] [address]      Labs: BNP (last 3 results) Recent Labs    12/07/19 1337 12/11/19 0535  BNP 152.7* 35.6   Basic Metabolic Panel: Recent Labs  Lab 12/07/19 1337 12/07/19 1340 12/09/19 0453 12/10/19 0421 12/11/19 0535 12/12/19 0452 12/13/19 0430  NA  --    < > 141 142 143 144 142  K  --    < > 4.2 3.8 3.2* 3.1* 3.3*  CL  --    < > 102 102 100 99 96*  CO2  --    < > 31 32 34* 34* 36*  GLUCOSE  --    < > 289* 249* 140* 68* 130*  BUN  --    < > 23 23 23 20 21   CREATININE  --    < > 0.80 0.79 0.78 0.76 0.68  CALCIUM  --    < > 8.5* 8.6* 8.4* 8.1* 8.3*  MG 1.8  --   --   --   --   --   --    < > = values in this interval not displayed.   Liver Function Tests: Recent Labs  Lab 12/08/19 0422 12/09/19 0453  12/10/19 0421 12/11/19 0535 12/12/19 0452  AST 43* 34 27  24 24  ALT 18 19 17 17 18   ALKPHOS 51 42 43 41 43  BILITOT 0.7 0.5 0.4 0.6 0.8  PROT 7.4 6.8 6.5 6.4* 6.2*  ALBUMIN 3.2* 2.8* 2.7* 2.8* 2.7*   No results for input(s): LIPASE, AMYLASE in the last 168 hours. No results for input(s): AMMONIA in the last 168 hours. CBC: Recent Labs  Lab 12/07/19 1340 12/11/19 0535 12/12/19 1306 12/13/19 2231  WBC 6.8 6.0 6.9 13.1*  NEUTROABS 5.7  --  5.8  --   HGB 12.4 14.0 14.5 14.3  HCT 38.8 43.8 46.2* 45.3  MCV 80.2 79.6* 80.5 81.0  PLT 175 176 163 192   Cardiac Enzymes: No results for input(s): CKTOTAL, CKMB, CKMBINDEX, TROPONINI in the last 168 hours. BNP: Invalid input(s): POCBNP CBG: Recent Labs  Lab 12/12/19 2153 12/13/19 0756 12/13/19 1227 12/13/19 1547 12/13/19 2128  GLUCAP 221* 109* 291* 379* 354*   D-Dimer No results for input(s): DDIMER in the last 72 hours. Hgb A1c No results for input(s): HGBA1C in the last 72 hours. Lipid Profile No results for input(s): CHOL, HDL, LDLCALC, TRIG, CHOLHDL, LDLDIRECT in the last 72 hours. Thyroid function studies No results for input(s): TSH, T4TOTAL, T3FREE, THYROIDAB in the last 72 hours.  Invalid input(s): FREET3 Anemia work up No results for input(s): VITAMINB12, FOLATE, FERRITIN, TIBC, IRON, RETICCTPCT in the last 72 hours. Urinalysis    Component Value Date/Time   COLORURINE YELLOW (A) 12/12/2019 1246   APPEARANCEUR HAZY (A) 12/12/2019 1246   LABSPEC 1.012 12/12/2019 1246   PHURINE 8.0 12/12/2019 1246   GLUCOSEU 50 (A) 12/12/2019 1246   HGBUR LARGE (A) 12/12/2019 1246   BILIRUBINUR NEGATIVE 12/12/2019 1246   KETONESUR NEGATIVE 12/12/2019 1246   PROTEINUR 30 (A) 12/12/2019 1246   NITRITE POSITIVE (A) 12/12/2019 1246   LEUKOCYTESUR NEGATIVE 12/12/2019 1246   Sepsis Labs Invalid input(s): PROCALCITONIN,  WBC,  LACTICIDVEN Microbiology Recent Results (from the past 240 hour(s))  Respiratory Panel by RT PCR  (Flu A&B, Covid) - Nasopharyngeal Swab     Status: Abnormal   Collection Time: 12/07/19  1:37 PM   Specimen: Nasopharyngeal Swab  Result Value Ref Range Status   SARS Coronavirus 2 by RT PCR POSITIVE (A) NEGATIVE Final    Comment: RESULT CALLED TO, READ BACK BY AND VERIFIED WITH: AMBER REDAMCE @ 1516 ON 12/07/2019 BY CAF (NOTE) SARS-CoV-2 target nucleic acids are DETECTED.  SARS-CoV-2 RNA is generally detectable in upper respiratory specimens  during the acute phase of infection. Positive results are indicative of the presence of the identified virus, but do not rule out bacterial infection or co-infection with other pathogens not detected by the test. Clinical correlation with patient history and other diagnostic information is necessary to determine patient infection status. The expected result is Negative.  Fact Sheet for Patients:  https://www.moore.com/  Fact Sheet for Healthcare Providers: https://www.young.biz/  This test is not yet approved or cleared by the [place] FDA and  has been authorized for detection and/or diagnosis of SARS-CoV-2 by FDA under an Emergency Use Authorization (EUA).  This EUA will remain in effect (meaning this test  can be used) for the duration of  the COVID-19 declaration under Section 564(b)(1) of the Act, 21 U.S.C. section 360bbb-3(b)(1), unless the authorization is terminated or revoked sooner.      Influenza A by PCR NEGATIVE NEGATIVE Final   Influenza B by PCR NEGATIVE NEGATIVE Final    Comment: (NOTE) The Xpert Xpress SARS-CoV-2/FLU/RSV assay is intended as an  aid in  the diagnosis of influenza from Nasopharyngeal swab specimens and  should not be used as a sole basis for treatment. Nasal washings and  aspirates are unacceptable for Xpert Xpress SARS-CoV-2/FLU/RSV  testing.  Fact Sheet for Patients: https://www.moore.com/  Fact Sheet for Healthcare  Providers: https://www.young.biz/  This test is not yet approved or cleared by the [place] FDA and  has been authorized for detection and/or diagnosis of SARS-CoV-2 by  FDA under an Emergency Use Authorization (EUA). This EUA will remain  in effect (meaning this test can be used) for the duration of the  Covid-19 declaration under Section 564(b)(1) of the Act, 21  U.S.C. section 360bbb-3(b)(1), unless the authorization is  terminated or revoked. Performed at [facility], [address] [place], [place] [address]     Please note: You were cared for by a hospitalist during your hospital stay. Once you are discharged, your primary care physician will handle any further medical issues. Please note that NO REFILLS for any discharge medications will be authorized once you are discharged, as it is imperative that you return to your primary care physician (or establish a relationship with a primary care physician if you do not have one) for your post hospital discharge needs so that they can reassess your need for medications and monitor your lab values.    Time coordinating discharge: 40 minutes  SIGNED:   Burnadette Pop, MD  [facility] 12/14/2019, 7:53 AM Pager [phone]  If 7PM-7AM, please contact night-coverage [email or web address] Password [email or web address]

## 2019-12-24 ENCOUNTER — Telehealth: Payer: Self-pay | Admitting: Urology

## 2019-12-24 NOTE — Telephone Encounter (Signed)
OK, would schedule her in 2-3 months to be sure she isn't lost to follow up, thanks  Legrand Rams, MD 12/24/2019

## 2019-12-24 NOTE — Telephone Encounter (Signed)
Pt has been in hospital w/COVID and pneumonia since 10/2.  Her caregiver isn't sure when she will be leaving.

## 2019-12-24 NOTE — Telephone Encounter (Signed)
Error

## 2020-01-01 ENCOUNTER — Other Ambulatory Visit: Payer: Self-pay

## 2020-01-01 ENCOUNTER — Encounter: Payer: Self-pay | Admitting: Family

## 2020-01-01 ENCOUNTER — Ambulatory Visit: Payer: Medicare HMO | Attending: Family | Admitting: Family

## 2020-01-01 VITALS — BP 142/63 | HR 73 | Resp 18 | Ht 62.0 in | Wt 292.0 lb

## 2020-01-01 DIAGNOSIS — I5032 Chronic diastolic (congestive) heart failure: Secondary | ICD-10-CM | POA: Insufficient documentation

## 2020-01-01 DIAGNOSIS — I110 Hypertensive heart disease with heart failure: Secondary | ICD-10-CM | POA: Diagnosis not present

## 2020-01-01 DIAGNOSIS — J449 Chronic obstructive pulmonary disease, unspecified: Secondary | ICD-10-CM | POA: Insufficient documentation

## 2020-01-01 DIAGNOSIS — Z8616 Personal history of COVID-19: Secondary | ICD-10-CM | POA: Insufficient documentation

## 2020-01-01 DIAGNOSIS — Z79899 Other long term (current) drug therapy: Secondary | ICD-10-CM | POA: Diagnosis not present

## 2020-01-01 DIAGNOSIS — Z794 Long term (current) use of insulin: Secondary | ICD-10-CM | POA: Diagnosis not present

## 2020-01-01 DIAGNOSIS — Z9049 Acquired absence of other specified parts of digestive tract: Secondary | ICD-10-CM | POA: Diagnosis not present

## 2020-01-01 DIAGNOSIS — I890 Lymphedema, not elsewhere classified: Secondary | ICD-10-CM | POA: Insufficient documentation

## 2020-01-01 LAB — GLUCOSE, CAPILLARY: Glucose-Capillary: 165 mg/dL — ABNORMAL HIGH (ref 70–99)

## 2020-01-01 NOTE — Patient Instructions (Addendum)
-Please try to get daily weights if possible -Call clinic if patient is gaining fluid on the lungs or belly and may benefit from IV lasix. This would be shown with gaining more than 2lbs overnight or 5 in a week.    DASH Eating Plan DASH stands for "Dietary Approaches to Stop Hypertension." The DASH eating plan is a healthy eating plan that has been shown to reduce high blood pressure (hypertension). It may also reduce your risk for type 2 diabetes, heart disease, and stroke. The DASH eating plan may also help with weight loss. What are tips for following this plan?  General guidelines  Avoid eating more than 2,300 mg (milligrams) of salt (sodium) a day. If you have hypertension, you may need to reduce your sodium intake to 1,500 mg a day.  Limit alcohol intake to no more than 1 drink a day for nonpregnant women and 2 drinks a day for men. One drink equals 12 oz of beer, 5 oz of wine, or 1 oz of hard liquor.  Work with your health care provider to maintain a healthy body weight or to lose weight. Ask what an ideal weight is for you.  Get at least 30 minutes of exercise that causes your heart to beat faster (aerobic exercise) most days of the week. Activities may include walking, swimming, or biking.  Work with your health care provider or diet and nutrition specialist (dietitian) to adjust your eating plan to your individual calorie needs. Reading food labels   Check food labels for the amount of sodium per serving. Choose foods with less than 5 percent of the Daily Value of sodium. Generally, foods with less than 300 mg of sodium per serving fit into this eating plan.  To find whole grains, look for the word "whole" as the first word in the ingredient list. Shopping  Buy products labeled as "low-sodium" or "no salt added."  Buy fresh foods. Avoid canned foods and premade or frozen meals. Cooking  Avoid adding salt when cooking. Use salt-free seasonings or herbs instead of table salt  or sea salt. Check with your health care provider or pharmacist before using salt substitutes.  Do not fry foods. Cook foods using healthy methods such as baking, boiling, grilling, and broiling instead.  Cook with heart-healthy oils, such as olive, canola, soybean, or sunflower oil. Meal planning  Eat a balanced diet that includes: ? 5 or more servings of fruits and vegetables each day. At each meal, try to fill half of your plate with fruits and vegetables. ? Up to 6-8 servings of whole grains each day. ? Less than 6 oz of lean meat, poultry, or fish each day. A 3-oz serving of meat is about the same size as a deck of cards. One egg equals 1 oz. ? 2 servings of low-fat dairy each day. ? A serving of nuts, seeds, or beans 5 times each week. ? Heart-healthy fats. Healthy fats called Omega-3 fatty acids are found in foods such as flaxseeds and coldwater fish, like sardines, salmon, and mackerel.  Limit how much you eat of the following: ? Canned or prepackaged foods. ? Food that is high in trans fat, such as fried foods. ? Food that is high in saturated fat, such as fatty meat. ? Sweets, desserts, sugary drinks, and other foods with added sugar. ? Full-fat dairy products.  Do not salt foods before eating.  Try to eat at least 2 vegetarian meals each week.  Eat more home-cooked food and  less restaurant, buffet, and fast food.  When eating at a restaurant, ask that your food be prepared with less salt or no salt, if possible. What foods are recommended? The items listed may not be a complete list. Talk with your dietitian about what dietary choices are best for you. Grains Whole-grain or whole-wheat bread. Whole-grain or whole-wheat pasta. Brown rice. [place]. Bulgur. Whole-grain and low-sodium cereals. Pita bread. Low-fat, low-sodium crackers. Whole-wheat flour tortillas. Vegetables Fresh or frozen vegetables (raw, steamed, roasted, or grilled). Low-sodium or reduced-sodium  tomato and vegetable juice. Low-sodium or reduced-sodium tomato sauce and tomato paste. Low-sodium or reduced-sodium canned vegetables. Fruits All fresh, dried, or frozen fruit. Canned fruit in natural juice (without added sugar). Meat and other protein foods Skinless chicken or [place]. Ground chicken or [place]. Pork with fat trimmed off. Fish and seafood. Egg whites. Dried beans, peas, or lentils. Unsalted nuts, nut butters, and seeds. Unsalted canned beans. Lean cuts of beef with fat trimmed off. Low-sodium, lean deli meat. Dairy Low-fat (1%) or fat-free (skim) milk. Fat-free, low-fat, or reduced-fat cheeses. Nonfat, low-sodium ricotta or cottage cheese. Low-fat or nonfat yogurt. Low-fat, low-sodium cheese. Fats and oils Soft margarine without trans fats. Vegetable oil. Low-fat, reduced-fat, or light mayonnaise and salad dressings (reduced-sodium). Canola, safflower, olive, soybean, and sunflower oils. Avocado. Seasoning and other foods Herbs. Spices. Seasoning mixes without salt. Unsalted popcorn and pretzels. Fat-free sweets. What foods are not recommended? The items listed may not be a complete list. Talk with your dietitian about what dietary choices are best for you. Grains Baked goods made with fat, such as croissants, muffins, or some breads. Dry pasta or rice meal packs. Vegetables Creamed or fried vegetables. Vegetables in a cheese sauce. Regular canned vegetables (not low-sodium or reduced-sodium). Regular canned tomato sauce and paste (not low-sodium or reduced-sodium). Regular tomato and vegetable juice (not low-sodium or reduced-sodium). Angie Fava. Olives. Fruits Canned fruit in a light or heavy syrup. Fried fruit. Fruit in cream or butter sauce. Meat and other protein foods Fatty cuts of meat. Ribs. Fried meat. Berniece Salines. Sausage. Bologna and other processed lunch meats. Salami. Fatback. Hotdogs. Bratwurst. Salted nuts and seeds. Canned beans with added salt. Canned or smoked fish.  Whole eggs or egg yolks. Chicken or [place] with skin. Dairy Whole or 2% milk, cream, and half-and-half. Whole or full-fat cream cheese. Whole-fat or sweetened yogurt. Full-fat cheese. Nondairy creamers. Whipped toppings. Processed cheese and cheese spreads. Fats and oils Butter. Stick margarine. Lard. Shortening. Ghee. Bacon fat. Tropical oils, such as coconut, palm kernel, or palm oil. Seasoning and other foods Salted popcorn and pretzels. Onion salt, garlic salt, seasoned salt, table salt, and sea salt. Worcestershire sauce. Tartar sauce. Barbecue sauce. Teriyaki sauce. Soy sauce, including reduced-sodium. Steak sauce. Canned and packaged gravies. Fish sauce. Oyster sauce. Cocktail sauce. Horseradish that you find on the shelf. Ketchup. Mustard. Meat flavorings and tenderizers. Bouillon cubes. Hot sauce and Tabasco sauce. Premade or packaged marinades. Premade or packaged taco seasonings. Relishes. Regular salad dressings. Where to find more information:  National Heart, Lung, and [place]: https://wilson-[place].com/  American Heart Association: www.heart.org Summary  The DASH eating plan is a healthy eating plan that has been shown to reduce high blood pressure (hypertension). It may also reduce your risk for type 2 diabetes, heart disease, and stroke.  With the DASH eating plan, you should limit salt (sodium) intake to 2,300 mg a day. If you have hypertension, you may need to reduce your sodium intake to 1,500 mg a day.  When on the DASH eating plan, aim to eat more fresh fruits and vegetables, whole grains, lean proteins, low-fat dairy, and heart-healthy fats.  Work with your health care provider or diet and nutrition specialist (dietitian) to adjust your eating plan to your individual calorie needs. This information is not intended to replace advice given to you by your health care provider. Make sure you discuss any questions you have with your health care provider. Document Revised:  02/03/2017 Document Reviewed: 02/15/2016 Elsevier Patient Education  2020 Elsevier Inc.   Heart Failure, Diagnosis  Heart failure means that your heart is not able to pump blood in the right way. This makes it hard for your body to work well. Heart failure is usually a long-term (chronic) condition. You must take good care of yourself and follow your treatment plan from your doctor. What are the causes? This condition may be caused by:  High blood pressure.  Build up of cholesterol and fat in the arteries.  Heart attack. This injures the heart muscle.  Heart valves that do not open and close properly.  Damage of the heart muscle. This is also called cardiomyopathy.  Lung disease.  Abnormal heart rhythms. What increases the risk? The risk of heart failure goes up as a person ages. This condition is also more likely to develop in people who:  Are overweight.  Are female.  Smoke or chew tobacco.  Abuse alcohol or illegal drugs.  Have taken medicines that can damage the heart.  Have diabetes.  Have abnormal heart rhythms.  Have thyroid problems.  Have low blood counts (anemia). What are the signs or symptoms? Symptoms of this condition include:  Shortness of breath.  Coughing.  Swelling of the feet, ankles, legs, or belly.  Losing weight for no reason.  Trouble breathing.  Waking from sleep because of the need to sit up and get more air.  Rapid heartbeat.  Being very tired.  Feeling dizzy, or feeling like you may pass out (faint).  Having no desire to eat.  Feeling like you may vomit (nauseous).  Peeing (urinating) more at night.  Feeling confused. How is this treated?     This condition may be treated with:  Medicines. These can be given to treat blood pressure and to make the heart muscles stronger.  Changes in your daily life. These may include eating a healthy diet, staying at a healthy body weight, quitting tobacco and illegal drug use,  or doing exercises.  Surgery. Surgery can be done to open blocked valves, or to put devices in the heart, such as pacemakers.  A donor heart (heart transplant). You will receive a healthy heart from a donor. Follow these instructions at home:  Treat other conditions as told by your doctor. These may include high blood pressure, diabetes, thyroid disease, or abnormal heart rhythms.  Learn as much as you can about heart failure.  Get support as you need it.  Keep all follow-up visits as told by your doctor. This is important. Summary  Heart failure means that your heart is not able to pump blood in the right way.  This condition is caused by high blood pressure, heart attack, or damage of the heart muscle.  Symptoms of this condition include shortness of breath and swelling of the feet, ankles, legs, or belly. You may also feel very tired or feel like you may vomit.  You may be treated with medicines, surgery, or changes in your daily life.  Treat other health conditions  as told by your doctor. This information is not intended to replace advice given to you by your health care provider. Make sure you discuss any questions you have with your health care provider. Document Revised: 05/11/2018 Document Reviewed: 05/11/2018 Elsevier Patient Education  2020 [employer].

## 2020-01-01 NOTE — Progress Notes (Signed)
Subjective:    Patient ID: Grace Short. Vandekamp, female    DOB: 25-Apr-1945, 74 y.o.   MRN: [id number]  HPI Patient reports to the heart failure clinic as a new patient. Patient is a 74 year old hispanic female with a c/o leg swelling and shortness of breath. Patient denies N/V/D or constipation. Patient recently was hospitalized with COVID-19 Virus and was unvaccinated for COVID.    Patient has a significant history of Lymphedema to bilateral lower extremities, HTN, COPD, Type 2 DM, and Morbid obesity.   Patient was recently hospitalized on 12/07/19 after arriving by EMS for weakness and multiple falls. Patient was found to be COVID positive during this visit and hospitalized until 12/14/19 when she was discharged to [facility] for rehabilitation.   Patient had an Echo done 06/28/19 that showed an EF of >55%, Mild LVH, and mild to moderate tricuspid and mitral valve insufficiency.  Past Medical History:  Diagnosis Date   Chronic acquired lymphedema    Chronic heart failure with preserved ejection fraction (HFpEF) (HCC)    COPD (chronic obstructive pulmonary disease) (HCC)    CVA (cerebral vascular accident) (HCC)    DM (diabetes mellitus) (HCC)    HLD (hyperlipidemia)    Hypertension    Morbid obesity with BMI of 50.0-59.9, adult (HCC)    Partial seizure disorder (HCC)    SSS (sick sinus syndrome) (HCC)    Past Surgical History:  Procedure Laterality Date   APPENDECTOMY     BREAST SURGERY     colectomy     rectal surgery   HERNIA REPAIR     PACEMAKER IMPLANT     TONSILLECTOMY     History reviewed. No pertinent family history. Social History   Tobacco Use   Smoking status: Never Smoker   Smokeless tobacco: Never Used  Substance Use Topics   Alcohol use: Never   No Known Allergies Prior to Admission medications   Medication Sig Start Date End Date Taking? Authorizing Provider  acetaminophen (TYLENOL) 325 MG tablet Take 650 mg by mouth 2 (two) times daily  as needed.   Yes [provider]  albuterol (VENTOLIN HFA) 108 (90 Base) MCG/ACT inhaler Inhale 2 puffs into the lungs once as needed for wheezing or shortness of breath (for Grade 3 or 4 hypersensitivity reaction). 12/14/19  Yes Burnadette Pop, MD  atorvastatin (LIPITOR) 20 MG tablet Take 20 mg by mouth daily. 08/22/19  Yes [provider]  dorzolamide-timolol (COSOPT) 22.3-6.8 MG/ML ophthalmic solution Place 1 drop into both eyes 2 (two) times daily.   Yes [provider]  ENTRESTO 49-51 MG Take 1 tablet by mouth 2 (two) times daily. 11/18/19  Yes [provider]  furosemide (LASIX) 40 MG tablet Take 1 tablet (40 mg total) by mouth daily. 12/14/19  Yes Adhikari, Willia Craze, MD  guaiFENesin-dextromethorphan (ROBITUSSIN DM) 100-10 MG/5ML syrup Take 10 mLs by mouth every 4 (four) hours as needed for cough. 12/14/19  Yes Adhikari, Willia Craze, MD  insulin glargine (LANTUS) 100 UNIT/ML injection Inject 20 Units into the skin 2 (two) times daily.   Yes [provider]  insulin lispro (HUMALOG) 100 UNIT/ML injection Inject 10 Units into the skin See admin instructions. Inject 10u under the skin three times daily at mealtimes (plus applicable sliding scale) - do not use if blood glucose <150   Yes [provider]  levETIRAcetam (KEPPRA) 750 MG tablet Take 750 mg by mouth 2 (two) times daily. 10/31/19  Yes [provider]  linagliptin (TRADJENTA)  5 MG TABS tablet Take 5 mg by mouth daily.   Yes [provider]  metoprolol succinate (TOPROL-XL) 25 MG 24 hr tablet Take 25 mg by mouth daily. 10/15/19  Yes [provider]  nystatin cream (MYCOSTATIN) Apply 1 application topically daily.   Yes [provider]  potassium chloride SA (KLOR-CON) 20 MEQ tablet Take 2 tablets (40 mEq total) by mouth daily. 12/14/19  Yes Burnadette Pop, MD  predniSONE (DELTASONE) 10 MG tablet Take 1 tablet (10 mg total) by mouth daily. Take 4 pills daily for 3 days  then 2 pills daily for 3 days then 1 pill daily for 3 days then stop. 12/14/19  Yes Burnadette Pop, MD  hydrALAZINE (APRESOLINE) 50 MG tablet Take 1 tablet (50 mg total) by mouth every 8 (eight) hours. Patient not taking: Reported on 01/01/2020 12/14/19   Burnadette Pop, MD    Review of Systems  Constitutional: Positive for activity change and fatigue.  HENT: Positive for congestion and sinus pressure (Hx of sick sinus syndrome). Negative for sore throat.   Eyes: Negative.   Respiratory: Positive for cough, shortness of breath and wheezing.   Cardiovascular: Positive for leg swelling. Negative for chest pain and palpitations.  Gastrointestinal: Negative for abdominal distention, constipation, diarrhea, nausea and vomiting.  Endocrine: Negative.   Genitourinary: Negative.   Musculoskeletal: Negative for back pain and neck pain.  Skin: Negative.        Legs wrapped in UNNA boots  Allergic/Immunologic: Negative.   Neurological: Positive for dizziness and numbness.  Hematological: Negative for adenopathy. Does not bruise/bleed easily.  Psychiatric/Behavioral: Negative for sleep disturbance (Sleeping on 2 pillows) and suicidal ideas. The patient is not nervous/anxious.        Objective:   Physical Exam Constitutional:      General: She is not in acute distress.    Appearance: She is obese. She is not ill-appearing, toxic-appearing or diaphoretic.  HENT:     Nose: Congestion present.  Cardiovascular:     Rate and Rhythm: Normal rate and regular rhythm.     Heart sounds: Normal heart sounds.     Comments: Pacemaker Pulmonary:     Effort: No respiratory distress.     Breath sounds: No stridor. Wheezing (Wheezing throughout all lung sections) present. No rhonchi or rales.  Abdominal:     General: There is no distension.     Tenderness: There is no abdominal tenderness.  Musculoskeletal:     Right upper leg: Swelling and edema present.     Left upper leg: Swelling and edema present.      Right lower leg: Swelling present. 4+ Pitting Edema (Severe lymphedema) present.     Left lower leg: Swelling present. 4+ Pitting Edema (Severe Lymphedema) present.     Right ankle: Swelling present.     Left ankle: Swelling present.     Right foot: Swelling present.     Left foot: Swelling present.     Comments: Both lower legs wrapped in UNNA boots  Feet:     Right foot:     Skin integrity: Dry skin present.     Left foot:     Skin integrity: Dry skin present.  Skin:    General: Skin is warm.  Neurological:     Mental Status: She is alert.  Psychiatric:        Attention and Perception: Attention and perception normal.        Mood and Affect: Affect normal. Mood is depressed.  Speech: Speech normal.        Behavior: Behavior normal. Behavior is cooperative.        Thought Content: Thought content normal.     Lab Results  Component Value Date   CREATININE 0.68 12/13/2019   CREATININE 0.76 12/12/2019   CREATININE 0.78 12/11/2019   Vitals with BMI 12/14/2019 12/14/2019 12/14/2019  Height - - -  Weight - - -  BMI - - -  Systolic 128 137 588  Diastolic 65 57 135  Pulse 102 90 93       Assessment & Plan:   1: Chronic Heart Failure with preserved ejection fraction: -NYHA Class II -Not being weighed daily at [facility] due to extreme difficulty in standing and holding her weight; getting weighed ~ once/ week -Eating provided meals, not adding salt -Patient reports before she got COVID she was living with a friend and their family who cooked for her.  -Patient reports sleeping on 2 pillows at night with HOB elevated -Reports shortness of breath with exertion  -Not exercising due to symptoms -Dizziness reported with movement. -BMP 12/13/19 showed potassium 3.3, Sodium 142, Creatinine 0.68, GFR >60  2: Lymphedema: -Patient has severe swelling to bilateral lower extremities -Currently at [facility] getting her ace wraps changed x3 per  week -Patient not ambulatory due to swelling -No weeping noted -Saw Cardiology Juliann Pares) 11/18/19 -Saw podiatry Alberteen Spindle) for ingrown toenails and feet problems related to Lymphedema  3: COPD: -Patient recovering from COVID-19 -Patient had probably mild left lower lobe pneumonia showed on CTA on 12/07/19 -Patient on 2L [place] at baseline   Facility medication list reviewed.   Patient to follow up with clinic as needed. Patient given paperwork and magnet with information and informed her if she feels she is gaining fluid on her extremities, abdomen, or lungs that is new she should call clinic.

## 2020-01-02 ENCOUNTER — Telehealth: Payer: Self-pay | Admitting: Urology

## 2020-01-02 ENCOUNTER — Ambulatory Visit: Payer: 59 | Admitting: Urology

## 2020-01-02 NOTE — Telephone Encounter (Signed)
LMOM OF ROOM MATE AND MAILED APPT REMINDER TO PT

## 2020-01-16 ENCOUNTER — Ambulatory Visit: Payer: 59 | Admitting: Urology

## 2020-01-17 ENCOUNTER — Encounter: Payer: Self-pay | Admitting: Urology

## 2020-04-21 ENCOUNTER — Other Ambulatory Visit (INDEPENDENT_AMBULATORY_CARE_PROVIDER_SITE_OTHER): Payer: Self-pay | Admitting: Nurse Practitioner

## 2020-04-21 ENCOUNTER — Ambulatory Visit (INDEPENDENT_AMBULATORY_CARE_PROVIDER_SITE_OTHER): Payer: 59

## 2020-04-21 ENCOUNTER — Ambulatory Visit (INDEPENDENT_AMBULATORY_CARE_PROVIDER_SITE_OTHER): Payer: 59 | Admitting: Nurse Practitioner

## 2020-04-21 ENCOUNTER — Other Ambulatory Visit: Payer: Self-pay

## 2020-04-21 ENCOUNTER — Encounter (INDEPENDENT_AMBULATORY_CARE_PROVIDER_SITE_OTHER): Payer: Self-pay | Admitting: Nurse Practitioner

## 2020-04-21 VITALS — BP 174/75 | HR 75 | Resp 17 | Ht 62.0 in | Wt 302.0 lb

## 2020-04-21 DIAGNOSIS — I5032 Chronic diastolic (congestive) heart failure: Secondary | ICD-10-CM | POA: Diagnosis not present

## 2020-04-21 DIAGNOSIS — R609 Edema, unspecified: Secondary | ICD-10-CM

## 2020-04-21 DIAGNOSIS — I890 Lymphedema, not elsewhere classified: Secondary | ICD-10-CM | POA: Diagnosis not present

## 2020-04-21 DIAGNOSIS — E1169 Type 2 diabetes mellitus with other specified complication: Secondary | ICD-10-CM | POA: Diagnosis not present

## 2020-04-21 DIAGNOSIS — I10 Essential (primary) hypertension: Secondary | ICD-10-CM | POA: Diagnosis not present

## 2020-04-21 DIAGNOSIS — E785 Hyperlipidemia, unspecified: Secondary | ICD-10-CM

## 2020-04-26 ENCOUNTER — Encounter (INDEPENDENT_AMBULATORY_CARE_PROVIDER_SITE_OTHER): Payer: Self-pay | Admitting: Nurse Practitioner

## 2020-04-26 NOTE — Progress Notes (Signed)
Subjective:    Patient ID: Grace Short. Grace Short, female    DOB: 1946/02/22, 75 y.o.   MRN: [id number] Chief Complaint  Patient presents with  . New Patient (Initial Visit)    Bilateral leg swelling    Grace Short is a 75 year old female that presents as referral from Dr. Vennie Homans.  The patient has a previous history of morbid obesity, sick sinus syndrome, heart failure as well as lymphedema.  The patient was seen in his office with leg weeping as well as ulcerations.  The patient has never had any previous formal assessment for edema.  The patient has had bilateral Unna wraps since sometime in September.  The patient is also elevating her lower extremities as much as possible.  Due to the patient's size she is not able to ambulate for extended distances.  Currently the patient has some open areas but no significant weeping.  She denies any fever, chills, nausea, vomiting or diarrhea.  Today noninvasive studies were limited due to the patient's size and severe edema however we are unable to visualize any DVTs bilaterally.  There was no evidence of deep venous insufficiency seen bilaterally.  No evidence of superficial venous reflux seen bilaterally.  A limited lower extremity arterial duplex was done and the bilateral anterior and posterior arteries had triphasic waveforms.   Review of Systems  Respiratory: Positive for shortness of breath.   Cardiovascular: Positive for leg swelling.  Neurological: Positive for weakness.  All other systems reviewed and are negative.      Objective:   Physical Exam Vitals reviewed.  HENT:     Head: Normocephalic.  Cardiovascular:     Rate and Rhythm: Normal rate.     Comments: Unable to palpate pedal pulses due to edema Pulmonary:     Effort: Pulmonary effort is normal.  Musculoskeletal:     Right lower leg: 4+ Edema present.     Left lower leg: 4+ Edema present.  Neurological:     Mental Status: She is alert and oriented to person, place, and time.      Motor: Weakness present.     Gait: Gait abnormal.  Psychiatric:        Mood and Affect: Mood normal.        Behavior: Behavior normal.        Thought Content: Thought content normal.        Judgment: Judgment normal.     BP (!) 174/75 (BP Location: Right Arm)   Pulse 75   Resp 17   Ht 5\' 2"  (1.575 m)   Wt (!) 302 lb (137 kg)   BMI 55.24 kg/m   Past Medical History:  Diagnosis Date  . Chronic acquired lymphedema   . Chronic heart failure with preserved ejection fraction (HFpEF) (HCC)   . COPD (chronic obstructive pulmonary disease) (HCC)   . CVA (cerebral vascular accident) (HCC)   . DM (diabetes mellitus) (HCC)   . HLD (hyperlipidemia)   . Hypertension   . Morbid obesity with BMI of 50.0-59.9, adult (HCC)   . Partial seizure disorder (HCC)   . SSS (sick sinus syndrome) (HCC)     Social History   Socioeconomic History  . Marital status: Single    Spouse name: Not on file  . Number of children: Not on file  . Years of education: Not on file  . Highest education level: Not on file  Occupational History  . Not on file  Tobacco Use  . Smoking status: Never Smoker  .  Smokeless tobacco: Never Used  Vaping Use  . Vaping Use: Never used  Substance and Sexual Activity  . Alcohol use: Never  . Drug use: Never  . Sexual activity: Not on file  Other Topics Concern  . Not on file  Social History Narrative  . Not on file   Social Determinants of Health   Financial Resource Strain: Not on file  Food Insecurity: Not on file  Transportation Needs: Not on file  Physical Activity: Not on file  Stress: Not on file  Social Connections: Not on file  Intimate Partner Violence: Not on file    Past Surgical History:  Procedure Laterality Date  . APPENDECTOMY    . BREAST SURGERY    . colectomy     rectal surgery  . HERNIA REPAIR    . PACEMAKER IMPLANT    . TONSILLECTOMY      Family History  Problem Relation Age of Onset  . Diabetes Sister   . Heart disease  Sister     No Known Allergies  CBC Latest Ref Rng & Units 12/13/2019 12/12/2019 12/11/2019  WBC 4.0 - 10.5 K/uL 13.1(H) 6.9 6.0  Hemoglobin 12.0 - 15.0 g/dL 35.7 01.7 79.3  Hematocrit 36.0 - 46.0 % 45.3 46.2(H) 43.8  Platelets 150 - 400 K/uL 192 163 176      CMP     Component Value Date/Time   NA 142 12/13/2019 0430   K 3.3 (L) 12/13/2019 0430   CL 96 (L) 12/13/2019 0430   CO2 36 (H) 12/13/2019 0430   GLUCOSE 130 (H) 12/13/2019 0430   BUN 21 12/13/2019 0430   CREATININE 0.68 12/13/2019 0430   CALCIUM 8.3 (L) 12/13/2019 0430   PROT 6.2 (L) 12/12/2019 0452   ALBUMIN 2.7 (L) 12/12/2019 0452   AST 24 12/12/2019 0452   ALT 18 12/12/2019 0452   ALKPHOS 43 12/12/2019 0452   BILITOT 0.8 12/12/2019 0452   GFRNONAA >60 12/13/2019 0430   GFRAA >60 12/10/2019 0421     No results found.     Assessment & Plan:   1. Lymphedema Recommend:  No surgery or intervention at this point in time.    I have reviewed my previous discussion with the patient regarding swelling and why it causes symptoms.  Patient will continue wearing graduated compression stockings class 1 (20-30 mmHg) on a daily basis. The patient will  beginning wearing the stockings first thing in the morning and removing them in the evening. The patient is instructed specifically not to sleep in the stockings.    In addition, behavioral modification including several periods of elevation of the lower extremities during the day will be continued.  This was reviewed with the patient during the initial visit.  The patient will also continue routine exercise, especially walking on a daily basis as was discussed during the initial visit.    Despite conservative treatments for at least four weeks, including graduated compression therapy class 1 and behavioral modification including exercise and elevation the patient  has not obtained adequate control of the lymphedema.  The patient still has stage 3 lymphedema and therefore, I  believe that a lymph pump should be added to improve the control of the patient's lymphedema.  Additionally, a lymph pump is warranted because it will reduce the risk of cellulitis and ulceration in the future.  Patient should follow-up in six months   The patient has several small ulcerations.  She will continue to have Unna wraps done by home health.  2. Primary hypertension Continue antihypertensive medications as already ordered, these medications have been reviewed and there are no changes at this time.   3. Type 2 diabetes mellitus with hyperlipidemia (HCC) Continue hypoglycemic medications as already ordered, these medications have been reviewed and there are no changes at this time.  Hgb A1C to be monitored as already arranged by primary service   4. Chronic heart failure with preserved ejection fraction (HFpEF) (HCC) This is also likely a contributing factor the patient's severe lower extremity edema.   Current Outpatient Medications on File Prior to Visit  Medication Sig Dispense Refill  . acetaminophen (TYLENOL) 325 MG tablet Take 650 mg by mouth 2 (two) times daily as needed.    Marland Kitchen albuterol (VENTOLIN HFA) 108 (90 Base) MCG/ACT inhaler Inhale 2 puffs into the lungs once as needed for wheezing or shortness of breath (for Grade 3 or 4 hypersensitivity reaction).    . dorzolamide-timolol (COSOPT) 22.3-6.8 MG/ML ophthalmic solution Place 1 drop into both eyes 2 (two) times daily.    Marland Kitchen ENTRESTO 49-51 MG Take 1 tablet by mouth 2 (two) times daily.    . furosemide (LASIX) 40 MG tablet Take 1 tablet (40 mg total) by mouth daily. 30 tablet   . guaiFENesin-dextromethorphan (ROBITUSSIN DM) 100-10 MG/5ML syrup Take 10 mLs by mouth every 4 (four) hours as needed for cough. 118 mL 0  . insulin glargine (LANTUS) 100 UNIT/ML injection Inject 20 Units into the skin 2 (two) times daily.    . insulin lispro (HUMALOG) 100 UNIT/ML injection Inject 10 Units into the skin See admin instructions.  Inject 10u under the skin three times daily at mealtimes (plus applicable sliding scale) - do not use if blood glucose <150    . levETIRAcetam (KEPPRA) 750 MG tablet Take 750 mg by mouth 2 (two) times daily.    Marland Kitchen linagliptin (TRADJENTA) 5 MG TABS tablet Take 5 mg by mouth daily.    . metoprolol succinate (TOPROL-XL) 25 MG 24 hr tablet Take 25 mg by mouth daily.    Marland Kitchen atorvastatin (LIPITOR) 20 MG tablet Take 20 mg by mouth daily. (Patient not taking: Reported on 04/21/2020)    . hydrALAZINE (APRESOLINE) 50 MG tablet Take 1 tablet (50 mg total) by mouth every 8 (eight) hours. (Patient not taking: No sig reported)    . nystatin cream (MYCOSTATIN) Apply 1 application topically daily. (Patient not taking: Reported on 04/21/2020)    . potassium chloride SA (KLOR-CON) 20 MEQ tablet Take 2 tablets (40 mEq total) by mouth daily. (Patient not taking: Reported on 04/21/2020)    . predniSONE (DELTASONE) 10 MG tablet Take 1 tablet (10 mg total) by mouth daily. Take 4 pills daily for 3 days then 2 pills daily for 3 days then 1 pill daily for 3 days then stop. (Patient not taking: Reported on 04/21/2020) 30 tablet 0   No current facility-administered medications on file prior to visit.    There are no Patient Instructions on file for this visit. No follow-ups on file.   Georgiana Spinner, NP

## 2020-08-25 ENCOUNTER — Emergency Department: Payer: Medicare Other

## 2020-08-25 ENCOUNTER — Emergency Department
Admission: EM | Admit: 2020-08-25 | Discharge: 2020-08-25 | Disposition: A | Discharge status: Home / Self Care | Payer: Medicare Other | Attending: Student in an Organized Health Care Education/Training Program | Admitting: Student in an Organized Health Care Education/Training Program

## 2020-08-25 ENCOUNTER — Other Ambulatory Visit: Payer: Self-pay

## 2020-08-25 DIAGNOSIS — Z950 Presence of cardiac pacemaker: Secondary | ICD-10-CM | POA: Insufficient documentation

## 2020-08-25 DIAGNOSIS — J449 Chronic obstructive pulmonary disease, unspecified: Secondary | ICD-10-CM | POA: Diagnosis not present

## 2020-08-25 DIAGNOSIS — D649 Anemia, unspecified: Secondary | ICD-10-CM | POA: Diagnosis not present

## 2020-08-25 DIAGNOSIS — Z79899 Other long term (current) drug therapy: Secondary | ICD-10-CM | POA: Insufficient documentation

## 2020-08-25 DIAGNOSIS — Z7984 Long term (current) use of oral hypoglycemic drugs: Secondary | ICD-10-CM | POA: Insufficient documentation

## 2020-08-25 DIAGNOSIS — R600 Localized edema: Secondary | ICD-10-CM | POA: Diagnosis not present

## 2020-08-25 DIAGNOSIS — Z794 Long term (current) use of insulin: Secondary | ICD-10-CM | POA: Diagnosis not present

## 2020-08-25 DIAGNOSIS — I110 Hypertensive heart disease with heart failure: Secondary | ICD-10-CM | POA: Insufficient documentation

## 2020-08-25 DIAGNOSIS — Z8616 Personal history of COVID-19: Secondary | ICD-10-CM | POA: Diagnosis not present

## 2020-08-25 DIAGNOSIS — I890 Lymphedema, not elsewhere classified: Secondary | ICD-10-CM

## 2020-08-25 DIAGNOSIS — M25511 Pain in right shoulder: Secondary | ICD-10-CM | POA: Diagnosis present

## 2020-08-25 DIAGNOSIS — M19011 Primary osteoarthritis, right shoulder: Secondary | ICD-10-CM

## 2020-08-25 DIAGNOSIS — I509 Heart failure, unspecified: Secondary | ICD-10-CM | POA: Diagnosis not present

## 2020-08-25 DIAGNOSIS — E119 Type 2 diabetes mellitus without complications: Secondary | ICD-10-CM | POA: Diagnosis not present

## 2020-08-25 LAB — CBC WITH DIFFERENTIAL/PLATELET
Abs Immature Granulocytes: 0.05 10*3/uL (ref 0.00–0.07)
Basophils Absolute: 0 10*3/uL (ref 0.0–0.1)
Basophils Relative: 0 %
Eosinophils Absolute: 0.1 10*3/uL (ref 0.0–0.5)
Eosinophils Relative: 1 %
HCT: 35.3 % — ABNORMAL LOW (ref 36.0–46.0)
Hemoglobin: 10.7 g/dL — ABNORMAL LOW (ref 12.0–15.0)
Immature Granulocytes: 0 %
Lymphocytes Relative: 19 %
Lymphs Abs: 2.1 10*3/uL (ref 0.7–4.0)
MCH: 25.8 pg — ABNORMAL LOW (ref 26.0–34.0)
MCHC: 30.3 g/dL (ref 30.0–36.0)
MCV: 85.3 fL (ref 80.0–100.0)
Monocytes Absolute: 0.8 10*3/uL (ref 0.1–1.0)
Monocytes Relative: 7 %
Neutro Abs: 8.4 10*3/uL — ABNORMAL HIGH (ref 1.7–7.7)
Neutrophils Relative %: 73 %
Platelets: 244 10*3/uL (ref 150–400)
RBC: 4.14 MIL/uL (ref 3.87–5.11)
RDW: 15.2 % (ref 11.5–15.5)
WBC: 11.5 10*3/uL — ABNORMAL HIGH (ref 4.0–10.5)
nRBC: 0 % (ref 0.0–0.2)

## 2020-08-25 LAB — COMPREHENSIVE METABOLIC PANEL
ALT: 8 U/L (ref 0–44)
AST: 19 U/L (ref 15–41)
Albumin: 3.6 g/dL (ref 3.5–5.0)
Alkaline Phosphatase: 63 U/L (ref 38–126)
Anion gap: 7 (ref 5–15)
BUN: 20 mg/dL (ref 8–23)
CO2: 26 mmol/L (ref 22–32)
Calcium: 8.7 mg/dL — ABNORMAL LOW (ref 8.9–10.3)
Chloride: 104 mmol/L (ref 98–111)
Creatinine, Ser: 0.74 mg/dL (ref 0.44–1.00)
GFR, Estimated: >60 mL/min (ref >60)
Glucose, Bld: 180 mg/dL — ABNORMAL HIGH (ref 70–99)
Potassium: 5.3 mmol/L — ABNORMAL HIGH (ref 3.5–5.1)
Sodium: 137 mmol/L (ref 135–145)
Total Bilirubin: 1.2 mg/dL (ref 0.3–1.2)
Total Protein: 7.7 g/dL (ref 6.5–8.1)

## 2020-08-25 LAB — LACTIC ACID, PLASMA: Lactic Acid, Venous: 1.4 mmol/L (ref 0.5–1.9)

## 2020-08-25 NOTE — ED Triage Notes (Signed)
BIB [facility] from home c/o bilateral lower leg swelling and right wound check-states that it opened up over the weekend. 180/87, 72HR, 100% RA 98.4 oral temp

## 2020-08-25 NOTE — ED Notes (Signed)
Pt gave verbal consent to transfer.

## 2020-08-25 NOTE — ED Provider Notes (Signed)
[facility] Emergency Department Provider Note ____________________________________________   Event Date/Time   First MD Initiated Contact with Patient 08/25/20 1702     (approximate)  I have reviewed the triage vital signs and the nursing notes.   HISTORY  Chief Complaint Leg Swelling and Wound Check  HPI Grace Short is a 75 y.o. female with history of lymphedema, diabetes, morbid obesity, CHF, hypertension, and other chronic history as listed below presents to the emergency department for treatment and evaluation of bilateral lower extremity edema with weeping.  Home health has not been to see her in approximately 3 weeks because the certification ran out.  Primary care provider is working to reinstate home health visits.  Patient states that the wraps that the home health nurses were using had started working well but since they have not been able to come back out, she is having some skin peeling and occasional bleeding in the areas where her stockings were stuck to the skin.  She denies fever or other concerns regarding the lower extremities.  She is having some right shoulder pain without any recent injury as well.  No alleviating measures attempted.         Past Medical History:  Diagnosis Date   Chronic acquired lymphedema    Chronic heart failure with preserved ejection fraction (HFpEF) (HCC)    COPD (chronic obstructive pulmonary disease) (HCC)    CVA (cerebral vascular accident) (HCC)    DM (diabetes mellitus) (HCC)    HLD (hyperlipidemia)    Hypertension    Morbid obesity with BMI of 50.0-59.9, adult (HCC)    Partial seizure disorder (HCC)    SSS (sick sinus syndrome) (HCC)     Patient Active Problem List   Diagnosis Date Noted   Hematuria    Lymphedema    Type 2 diabetes mellitus with hyperlipidemia (HCC)    Morbid obesity with BMI of 50.0-59.9, adult (HCC)    Weakness    Chronic heart failure with preserved ejection fraction (HFpEF)  (HCC) 12/07/2019   Edema of both lower extremities due to peripheral venous insufficiency 12/07/2019   Stasis edema with ulcer and inflammation, bilateral (HCC) 12/07/2019   SSS (sick sinus syndrome) (HCC) 12/07/2019   Pneumonia due to COVID-19 virus 12/07/2019   HTN (hypertension) 12/07/2019   COPD (chronic obstructive pulmonary disease) (HCC) 12/07/2019   Acute respiratory failure with hypoxemia (HCC) 12/07/2019   Glaucoma 12/20/2017   Pacemaker 12/20/2017   Personal history of transient ischemic attack (TIA), and cerebral infarction without residual deficits 12/20/2017   Seizure disorder (HCC) 12/20/2017   Sleep apnea, unspecified 12/20/2017   Hypertension 12/20/2017    Past Surgical History:  Procedure Laterality Date   APPENDECTOMY     BREAST SURGERY     colectomy     rectal surgery   HERNIA REPAIR     PACEMAKER IMPLANT     TONSILLECTOMY      Prior to Admission medications   Medication Sig Start Date End Date Taking? Authorizing Provider  acetaminophen (TYLENOL) 325 MG tablet Take 650 mg by mouth 2 (two) times daily as needed.    [provider]  albuterol (VENTOLIN HFA) 108 (90 Base) MCG/ACT inhaler Inhale 2 puffs into the lungs once as needed for wheezing or shortness of breath (for Grade 3 or 4 hypersensitivity reaction). 12/14/19   Burnadette Pop, MD  atorvastatin (LIPITOR) 20 MG tablet Take 20 mg by mouth daily. Patient not taking: Reported on 04/21/2020 08/22/19   [provider]  dorzolamide-timolol (COSOPT) 22.3-6.8 MG/ML ophthalmic solution Place 1 drop into both eyes 2 (two) times daily.    [provider]  ENTRESTO 49-51 MG Take 1 tablet by mouth 2 (two) times daily. 11/18/19   [provider]  furosemide (LASIX) 40 MG tablet Take 1 tablet (40 mg total) by mouth daily. 12/14/19   Burnadette Pop, MD  guaiFENesin-dextromethorphan (ROBITUSSIN DM) 100-10 MG/5ML syrup Take 10 mLs by mouth every 4 (four) hours as needed for cough. 12/14/19    Burnadette Pop, MD  hydrALAZINE (APRESOLINE) 50 MG tablet Take 1 tablet (50 mg total) by mouth every 8 (eight) hours. Patient not taking: No sig reported 12/14/19   Burnadette Pop, MD  insulin glargine (LANTUS) 100 UNIT/ML injection Inject 20 Units into the skin 2 (two) times daily.    [provider]  insulin lispro (HUMALOG) 100 UNIT/ML injection Inject 10 Units into the skin See admin instructions. Inject 10u under the skin three times daily at mealtimes (plus applicable sliding scale) - do not use if blood glucose <150    [provider]  levETIRAcetam (KEPPRA) 750 MG tablet Take 750 mg by mouth 2 (two) times daily. 10/31/19   [provider]  linagliptin (TRADJENTA) 5 MG TABS tablet Take 5 mg by mouth daily.    [provider]  metoprolol succinate (TOPROL-XL) 25 MG 24 hr tablet Take 25 mg by mouth daily. 10/15/19   [provider]  nystatin cream (MYCOSTATIN) Apply 1 application topically daily. Patient not taking: Reported on 04/21/2020    [provider]  potassium chloride SA (KLOR-CON) 20 MEQ tablet Take 2 tablets (40 mEq total) by mouth daily. Patient not taking: Reported on 04/21/2020 12/14/19   Burnadette Pop, MD  predniSONE (DELTASONE) 10 MG tablet Take 1 tablet (10 mg total) by mouth daily. Take 4 pills daily for 3 days then 2 pills daily for 3 days then 1 pill daily for 3 days then stop. Patient not taking: Reported on 04/21/2020 12/14/19   Burnadette Pop, MD    Allergies Ativan [lorazepam]  Family History  Problem Relation Age of Onset   Diabetes Sister    Heart disease Sister     Social History Social History   Tobacco Use   Smoking status: Never   Smokeless tobacco: Never  Vaping Use   Vaping Use: Never used  Substance Use Topics   Alcohol use: Never   Drug use: Never    Review of Systems  Constitutional: No fever/chills Eyes: No visual changes. ENT: No sore throat. Cardiovascular: Denies chest  pain. Respiratory: Denies shortness of breath. Gastrointestinal: No abdominal pain.  No nausea, no vomiting.  No diarrhea.  No constipation. Genitourinary: Negative for dysuria. Musculoskeletal: Negative for back pain. Skin: Positive for skin peeling and weeping bilateral lower extremities. Neurological: Negative for headaches, focal weakness or numbness.   ____________________________________________   PHYSICAL EXAM:  VITAL SIGNS: ED Triage Vitals  Enc Vitals Group     BP 08/25/20 1631 (!) 189/91     Pulse Rate 08/25/20 1631 71     Resp 08/25/20 1631 16     Temp 08/25/20 1631 98.1 F (36.7 C)     Temp Source 08/25/20 1631 Oral     SpO2 08/25/20 1631 100 %     Weight 08/25/20 1627 (!) 326 lb (147.9 kg)     Height 08/25/20 1627 5\' 2"  (1.575 m)     Head Circumference --      Peak Flow --  Pain Score 08/25/20 1627 0     Pain Loc --      Pain Edu? --      Excl. in GC? --     Constitutional: Alert and oriented.  Chronically ill appearing and in no acute distress. Eyes: Conjunctivae are normal.  Head: Atraumatic. Nose: No congestion/rhinnorhea. Mouth/Throat: Mucous membranes are moist.   Neck: No stridor.   Hematological/Lymphatic/Immunilogical: No cervical lymphadenopathy. Cardiovascular: Normal rate, regular rhythm. Grossly normal heart sounds.  Good peripheral circulation. Respiratory: Normal respiratory effort.  No retractions. Lungs CTAB. Gastrointestinal: Soft and nontender. No distention. No abdominal bruits. No CVA tenderness. Genitourinary:  Musculoskeletal: Lymphedema bilateral lower extremities Neurologic:  Normal speech and language. No gross focal neurologic deficits are appreciated. No gait instability. Skin:  Skin is warm, dry and intact. No rash noted. Psychiatric: Mood and affect are normal. Speech and behavior are normal.  ____________________________________________   LABS (all labs ordered are listed, but only abnormal results are  displayed)  Labs Reviewed  COMPREHENSIVE METABOLIC PANEL - Abnormal; Notable for the following components:      Result Value   Potassium 5.3 (*)    Glucose, Bld 180 (*)    Calcium 8.7 (*)    All other components within normal limits  CBC WITH DIFFERENTIAL/PLATELET - Abnormal; Notable for the following components:   WBC 11.5 (*)    Hemoglobin 10.7 (*)    HCT 35.3 (*)    MCH 25.8 (*)    Neutro Abs 8.4 (*)    All other components within normal limits  LACTIC ACID, PLASMA   ____________________________________________  EKG  Not indicated ____________________________________________  RADIOLOGY  ED MD interpretation:    Image of the right shoulder consistent with osteoarthritis.  No acute findings. I, Kem Boroughs, personally viewed and evaluated these images (plain radiographs) as part of my medical decision making, as well as reviewing the written report by the radiologist.  Official radiology report(s): No results found.  ____________________________________________   PROCEDURES  Procedure(s) performed (including Critical Care):  Procedures  ____________________________________________   INITIAL IMPRESSION / ASSESSMENT AND PLAN     75 year old female presenting to the emergency department for treatment and evaluation of lower extremity swelling and weeping.  See HPI for further details.  Plan will be to get some baseline labs and get an appointment of the right shoulder.  DIFFERENTIAL DIAGNOSIS    ED COURSE  Image of the right shoulder is consistent with arthritis.    Labs are overall reassuring with the exception of a mild anemia.  Patient states that she has a history of anemia, but has not had any issues since moving to [place] about 3 years ago.   Lower extremities wrapped with Unna boot rolls and Mepilex border heels applied.   Patient stable for discharge and outpatient follow up. She and her caregiver were advised to get in touch with home  health agency to ensure visits are set to resume.  ___________________________________________   FINAL CLINICAL IMPRESSION(S) / ED DIAGNOSES  Final diagnoses:  Osteoarthritis of right shoulder, unspecified osteoarthritis type  Lymphedema of both lower extremities  Anemia, unspecified type     ED Discharge Orders     None        Grace Short was evaluated in Emergency Department on 08/27/2020 for the symptoms described in the history of present illness. She was evaluated in the context of the global COVID-19 pandemic, which necessitated consideration that the patient might be at risk for infection with the  SARS-CoV-2 virus that causes COVID-19. Institutional protocols and algorithms that pertain to the evaluation of patients at risk for COVID-19 are in a state of rapid change based on information released by regulatory bodies including the CDC and federal and state organizations. These policies and algorithms were followed during the patient's care in the ED.   Note:  This document was prepared using Dragon voice recognition software and may include unintentional dictation errors.    Chinita Pesterriplett, Towanda Hornstein B, FNP 08/27/20 1429    Merwyn KatosBradler, Evan K, MD 08/27/20 [phone]

## 2020-08-25 NOTE — Discharge Instructions (Addendum)
Please call your primary care provider to advise that the Unna boots were applied in the emergency department.  Also, please discuss recurrence of mild anemia.  Return to the emergency department for symptoms that change, worsen, or for new concerns if you are unable to see primary care.

## 2020-08-25 NOTE — ED Notes (Signed)
Unna boots placed on pt and also Mepilex Boarder Heel placed on both heels by Erie Noe, RN and [place], [place]

## 2020-09-06 ENCOUNTER — Other Ambulatory Visit: Payer: Self-pay

## 2020-09-06 ENCOUNTER — Inpatient Hospital Stay
Admission: EM | Admit: 2020-09-06 | Discharge: 2020-09-15 | DRG: 603 | Disposition: A | Discharge status: Skilled Nursing Facility | Payer: Medicare Other | Attending: Internal Medicine | Admitting: Internal Medicine

## 2020-09-06 ENCOUNTER — Encounter: Payer: Self-pay | Admitting: Emergency Medicine

## 2020-09-06 DIAGNOSIS — G40909 Epilepsy, unspecified, not intractable, without status epilepticus: Secondary | ICD-10-CM

## 2020-09-06 DIAGNOSIS — I160 Hypertensive urgency: Secondary | ICD-10-CM

## 2020-09-06 DIAGNOSIS — B372 Candidiasis of skin and nail: Secondary | ICD-10-CM | POA: Diagnosis present

## 2020-09-06 DIAGNOSIS — E1169 Type 2 diabetes mellitus with other specified complication: Secondary | ICD-10-CM | POA: Diagnosis present

## 2020-09-06 DIAGNOSIS — Z20822 Contact with and (suspected) exposure to covid-19: Secondary | ICD-10-CM | POA: Diagnosis present

## 2020-09-06 DIAGNOSIS — R197 Diarrhea, unspecified: Secondary | ICD-10-CM | POA: Diagnosis present

## 2020-09-06 DIAGNOSIS — D649 Anemia, unspecified: Secondary | ICD-10-CM | POA: Diagnosis present

## 2020-09-06 DIAGNOSIS — I10 Essential (primary) hypertension: Secondary | ICD-10-CM

## 2020-09-06 DIAGNOSIS — L03116 Cellulitis of left lower limb: Secondary | ICD-10-CM

## 2020-09-06 DIAGNOSIS — I5032 Chronic diastolic (congestive) heart failure: Secondary | ICD-10-CM

## 2020-09-06 DIAGNOSIS — G40109 Localization-related (focal) (partial) symptomatic epilepsy and epileptic syndromes with simple partial seizures, not intractable, without status epilepticus: Secondary | ICD-10-CM | POA: Diagnosis present

## 2020-09-06 DIAGNOSIS — L304 Erythema intertrigo: Secondary | ICD-10-CM | POA: Diagnosis present

## 2020-09-06 DIAGNOSIS — Z6841 Body Mass Index (BMI) 40.0 and over, adult: Secondary | ICD-10-CM

## 2020-09-06 DIAGNOSIS — E785 Hyperlipidemia, unspecified: Secondary | ICD-10-CM | POA: Diagnosis present

## 2020-09-06 DIAGNOSIS — I890 Lymphedema, not elsewhere classified: Secondary | ICD-10-CM

## 2020-09-06 DIAGNOSIS — I495 Sick sinus syndrome: Secondary | ICD-10-CM | POA: Diagnosis present

## 2020-09-06 DIAGNOSIS — I872 Venous insufficiency (chronic) (peripheral): Secondary | ICD-10-CM

## 2020-09-06 DIAGNOSIS — J449 Chronic obstructive pulmonary disease, unspecified: Secondary | ICD-10-CM | POA: Diagnosis present

## 2020-09-06 DIAGNOSIS — L03115 Cellulitis of right lower limb: Secondary | ICD-10-CM

## 2020-09-06 DIAGNOSIS — Z8673 Personal history of transient ischemic attack (TIA), and cerebral infarction without residual deficits: Secondary | ICD-10-CM

## 2020-09-06 DIAGNOSIS — Z950 Presence of cardiac pacemaker: Secondary | ICD-10-CM

## 2020-09-06 DIAGNOSIS — Z8249 Family history of ischemic heart disease and other diseases of the circulatory system: Secondary | ICD-10-CM

## 2020-09-06 DIAGNOSIS — R569 Unspecified convulsions: Secondary | ICD-10-CM

## 2020-09-06 DIAGNOSIS — I110 Hypertensive heart disease with heart failure: Secondary | ICD-10-CM | POA: Diagnosis present

## 2020-09-06 DIAGNOSIS — E876 Hypokalemia: Secondary | ICD-10-CM | POA: Diagnosis present

## 2020-09-06 DIAGNOSIS — Z79899 Other long term (current) drug therapy: Secondary | ICD-10-CM

## 2020-09-06 DIAGNOSIS — Z794 Long term (current) use of insulin: Secondary | ICD-10-CM

## 2020-09-06 DIAGNOSIS — Z888 Allergy status to other drugs, medicaments and biological substances status: Secondary | ICD-10-CM

## 2020-09-06 DIAGNOSIS — Z833 Family history of diabetes mellitus: Secondary | ICD-10-CM

## 2020-09-06 DIAGNOSIS — R531 Weakness: Secondary | ICD-10-CM | POA: Diagnosis present

## 2020-09-06 LAB — CBC WITH DIFFERENTIAL/PLATELET
Abs Immature Granulocytes: 0.08 10*3/uL — ABNORMAL HIGH (ref 0.00–0.07)
Basophils Absolute: 0 10*3/uL (ref 0.0–0.1)
Basophils Relative: 0 %
Eosinophils Absolute: 0 10*3/uL (ref 0.0–0.5)
Eosinophils Relative: 0 %
HCT: 34.9 % — ABNORMAL LOW (ref 36.0–46.0)
Hemoglobin: 10.8 g/dL — ABNORMAL LOW (ref 12.0–15.0)
Immature Granulocytes: 1 %
Lymphocytes Relative: 5 %
Lymphs Abs: 0.9 10*3/uL (ref 0.7–4.0)
MCH: 26 pg (ref 26.0–34.0)
MCHC: 30.9 g/dL (ref 30.0–36.0)
MCV: 84.1 fL (ref 80.0–100.0)
Monocytes Absolute: 1.2 10*3/uL — ABNORMAL HIGH (ref 0.1–1.0)
Monocytes Relative: 8 %
Neutro Abs: 14 10*3/uL — ABNORMAL HIGH (ref 1.7–7.7)
Neutrophils Relative %: 86 %
Platelets: 284 10*3/uL (ref 150–400)
RBC: 4.15 MIL/uL (ref 3.87–5.11)
RDW: 14.8 % (ref 11.5–15.5)
WBC: 16.2 10*3/uL — ABNORMAL HIGH (ref 4.0–10.5)
nRBC: 0 % (ref 0.0–0.2)

## 2020-09-06 LAB — COMPREHENSIVE METABOLIC PANEL
ALT: 11 U/L (ref 0–44)
AST: 26 U/L (ref 15–41)
Albumin: 3.3 g/dL — ABNORMAL LOW (ref 3.5–5.0)
Alkaline Phosphatase: 53 U/L (ref 38–126)
Anion gap: 10 (ref 5–15)
BUN: 15 mg/dL (ref 8–23)
CO2: 25 mmol/L (ref 22–32)
Calcium: 8.9 mg/dL (ref 8.9–10.3)
Chloride: 105 mmol/L (ref 98–111)
Creatinine, Ser: 0.85 mg/dL (ref 0.44–1.00)
GFR, Estimated: >60 mL/min (ref >60)
Glucose, Bld: 129 mg/dL — ABNORMAL HIGH (ref 70–99)
Potassium: 3.9 mmol/L (ref 3.5–5.1)
Sodium: 140 mmol/L (ref 135–145)
Total Bilirubin: 0.9 mg/dL (ref 0.3–1.2)
Total Protein: 7.5 g/dL (ref 6.5–8.1)

## 2020-09-06 LAB — LACTIC ACID, PLASMA: Lactic Acid, Venous: 1.6 mmol/L (ref 0.5–1.9)

## 2020-09-06 MED ORDER — ACETAMINOPHEN 325 MG PO TABS
650.0000 mg | ORAL_TABLET | Freq: Once | ORAL | Status: AC | PRN
  Administered 2020-09-06: 21:00:00 650 mg via ORAL
  Filled 2020-09-06: qty 2

## 2020-09-06 NOTE — ED Triage Notes (Signed)
Pt via EMS from home. Pt c/o bilateral leg pain and swelling. Pt has weeping sores to both legs. Denies hx of CHF. Pt states that the bandages on her legs hasn't been changed in 2weeks. Pt is A&Ox4 and NAD.

## 2020-09-06 NOTE — ED Provider Notes (Signed)
Emergency Medicine Provider Triage Evaluation Note  Grace Short , a 75 y.o. female  was evaluated in triage.  Pt complains of increased leg swelling and pain. Patient is a poor historian. In short, she has had known lymphedema with weeping and infection. Reports wounds were last wrapped "when she was in the hospital 2 weeks ago" however last record review says last hospitalization for this was possibly in March. Reports worsening pain. Denies known fevers.  Review of Systems  Positive: Leg pain bilateral Negative: Fevers, chest pain, shortness of breath  Physical Exam  BP (!) 143/67 (BP Location: Left Arm)   Pulse 89   Temp 98.7 F (37.1 C) (Oral)   Resp 20   Ht 5\' 2"  (1.575 m)   Wt 136.1 kg   SpO2 98%   BMI 54.87 kg/m  Gen:   Awake, chronically ill appearing in recliner Resp:  Normal effort  MSK:   Significant bilateral lymphedema present with erythematous left leg throughout the leg. Foul smell present when removing covering sheet. Weeping wounds partially wrapped.  Other:    Medical Decision Making  Medically screening exam initiated at 6:33 PM.  Appropriate orders placed.  Grace Short was informed that the remainder of the evaluation will be completed by another provider, this initial triage assessment does not replace that evaluation, and the importance of remaining in the ED until their evaluation is complete.    Robb Matar, PA 09/06/20 1844    11/07/20, MD 09/08/20 [phone]

## 2020-09-06 NOTE — ED Notes (Signed)
Both sets of cultures and blood work sent to lab

## 2020-09-06 NOTE — ED Triage Notes (Signed)
Pt in via EMS form home with c/o severe swelling to BLE. Pt also c/o pain to heels and legs. Pt has weeping sores on both legs. Pt also with fever yesterday. Pt family reports pt not able to ambulate with walker like usual. 99.8 temp, 157/70, 20 RR, #20 g to left hand

## 2020-09-07 ENCOUNTER — Emergency Department: Payer: Medicare Other

## 2020-09-07 DIAGNOSIS — L03115 Cellulitis of right lower limb: Secondary | ICD-10-CM | POA: Diagnosis present

## 2020-09-07 DIAGNOSIS — Z79899 Other long term (current) drug therapy: Secondary | ICD-10-CM | POA: Diagnosis not present

## 2020-09-07 DIAGNOSIS — B372 Candidiasis of skin and nail: Secondary | ICD-10-CM | POA: Diagnosis present

## 2020-09-07 DIAGNOSIS — I10 Essential (primary) hypertension: Secondary | ICD-10-CM | POA: Diagnosis not present

## 2020-09-07 DIAGNOSIS — I5032 Chronic diastolic (congestive) heart failure: Secondary | ICD-10-CM | POA: Diagnosis present

## 2020-09-07 DIAGNOSIS — Z888 Allergy status to other drugs, medicaments and biological substances status: Secondary | ICD-10-CM | POA: Diagnosis not present

## 2020-09-07 DIAGNOSIS — L03119 Cellulitis of unspecified part of limb: Secondary | ICD-10-CM

## 2020-09-07 DIAGNOSIS — G40109 Localization-related (focal) (partial) symptomatic epilepsy and epileptic syndromes with simple partial seizures, not intractable, without status epilepticus: Secondary | ICD-10-CM | POA: Diagnosis present

## 2020-09-07 DIAGNOSIS — E785 Hyperlipidemia, unspecified: Secondary | ICD-10-CM

## 2020-09-07 DIAGNOSIS — D72829 Elevated white blood cell count, unspecified: Secondary | ICD-10-CM

## 2020-09-07 DIAGNOSIS — J449 Chronic obstructive pulmonary disease, unspecified: Secondary | ICD-10-CM | POA: Diagnosis present

## 2020-09-07 DIAGNOSIS — I160 Hypertensive urgency: Secondary | ICD-10-CM | POA: Diagnosis present

## 2020-09-07 DIAGNOSIS — R197 Diarrhea, unspecified: Secondary | ICD-10-CM | POA: Diagnosis present

## 2020-09-07 DIAGNOSIS — I495 Sick sinus syndrome: Secondary | ICD-10-CM | POA: Diagnosis present

## 2020-09-07 DIAGNOSIS — Z833 Family history of diabetes mellitus: Secondary | ICD-10-CM | POA: Diagnosis not present

## 2020-09-07 DIAGNOSIS — Z6841 Body Mass Index (BMI) 40.0 and over, adult: Secondary | ICD-10-CM | POA: Diagnosis not present

## 2020-09-07 DIAGNOSIS — Z20822 Contact with and (suspected) exposure to covid-19: Secondary | ICD-10-CM | POA: Diagnosis present

## 2020-09-07 DIAGNOSIS — I110 Hypertensive heart disease with heart failure: Secondary | ICD-10-CM | POA: Diagnosis present

## 2020-09-07 DIAGNOSIS — R531 Weakness: Secondary | ICD-10-CM | POA: Diagnosis present

## 2020-09-07 DIAGNOSIS — Z950 Presence of cardiac pacemaker: Secondary | ICD-10-CM | POA: Diagnosis not present

## 2020-09-07 DIAGNOSIS — D649 Anemia, unspecified: Secondary | ICD-10-CM | POA: Diagnosis present

## 2020-09-07 DIAGNOSIS — I890 Lymphedema, not elsewhere classified: Secondary | ICD-10-CM | POA: Diagnosis present

## 2020-09-07 DIAGNOSIS — L03116 Cellulitis of left lower limb: Secondary | ICD-10-CM | POA: Diagnosis present

## 2020-09-07 DIAGNOSIS — E1169 Type 2 diabetes mellitus with other specified complication: Secondary | ICD-10-CM | POA: Diagnosis present

## 2020-09-07 DIAGNOSIS — Z8673 Personal history of transient ischemic attack (TIA), and cerebral infarction without residual deficits: Secondary | ICD-10-CM | POA: Diagnosis not present

## 2020-09-07 DIAGNOSIS — Z8249 Family history of ischemic heart disease and other diseases of the circulatory system: Secondary | ICD-10-CM | POA: Diagnosis not present

## 2020-09-07 LAB — CBC
HCT: 32.1 % — ABNORMAL LOW (ref 36.0–46.0)
Hemoglobin: 10.2 g/dL — ABNORMAL LOW (ref 12.0–15.0)
MCH: 26 pg (ref 26.0–34.0)
MCHC: 31.8 g/dL (ref 30.0–36.0)
MCV: 81.9 fL (ref 80.0–100.0)
Platelets: 267 10*3/uL (ref 150–400)
RBC: 3.92 MIL/uL (ref 3.87–5.11)
RDW: 14.7 % (ref 11.5–15.5)
WBC: 11.6 10*3/uL — ABNORMAL HIGH (ref 4.0–10.5)
nRBC: 0 % (ref 0.0–0.2)

## 2020-09-07 LAB — GLUCOSE, CAPILLARY
Glucose-Capillary: 144 mg/dL — ABNORMAL HIGH (ref 70–99)
Glucose-Capillary: 145 mg/dL — ABNORMAL HIGH (ref 70–99)
Glucose-Capillary: 128 mg/dL — ABNORMAL HIGH (ref 70–99)
Glucose-Capillary: 149 mg/dL — ABNORMAL HIGH (ref 70–99)
Glucose-Capillary: 157 mg/dL — ABNORMAL HIGH (ref 70–99)

## 2020-09-07 LAB — BASIC METABOLIC PANEL
Anion gap: 8 (ref 5–15)
BUN: 13 mg/dL (ref 8–23)
CO2: 25 mmol/L (ref 22–32)
Calcium: 8.4 mg/dL — ABNORMAL LOW (ref 8.9–10.3)
Chloride: 107 mmol/L (ref 98–111)
Creatinine, Ser: 0.79 mg/dL (ref 0.44–1.00)
GFR, Estimated: >60 mL/min (ref >60)
Glucose, Bld: 152 mg/dL — ABNORMAL HIGH (ref 70–99)
Potassium: 4 mmol/L (ref 3.5–5.1)
Sodium: 140 mmol/L (ref 135–145)

## 2020-09-07 LAB — RESP PANEL BY RT-PCR (FLU A&B, COVID) ARPGX2
Influenza A by PCR: NEGATIVE
Influenza B by PCR: NEGATIVE
SARS Coronavirus 2 by RT PCR: NEGATIVE

## 2020-09-07 LAB — URINALYSIS, COMPLETE (UACMP) WITH MICROSCOPIC
Bacteria, UA: NONE SEEN
Bilirubin Urine: NEGATIVE
Glucose, UA: NEGATIVE mg/dL
Hgb urine dipstick: NEGATIVE
Ketones, ur: 80 mg/dL — AB
Leukocytes,Ua: NEGATIVE
Nitrite: NEGATIVE
Protein, ur: 30 mg/dL — AB
Specific Gravity, Urine: 1.025 (ref 1.005–1.030)
pH: 5 (ref 5.0–8.0)

## 2020-09-07 LAB — PROCALCITONIN: Procalcitonin: 0.28 ng/mL

## 2020-09-07 MED ORDER — VANCOMYCIN HCL 10 G IV SOLR: 2500.0000 mg | Freq: Once | INTRAVENOUS | Status: DC

## 2020-09-07 MED ORDER — ONDANSETRON HCL 4 MG PO TABS: 4.0000 mg | ORAL_TABLET | Freq: Four times a day (QID) | ORAL | Status: DC | PRN

## 2020-09-07 MED ORDER — ONDANSETRON HCL 4 MG/2ML IJ SOLN: 4.0000 mg | Freq: Four times a day (QID) | INTRAMUSCULAR | Status: DC | PRN

## 2020-09-07 MED ORDER — ACETAMINOPHEN 650 MG RE SUPP: 650.0000 mg | Freq: Four times a day (QID) | RECTAL | Status: DC | PRN

## 2020-09-07 MED ORDER — SODIUM CHLORIDE 0.9 % IV SOLN
2.0000 g | INTRAVENOUS | Status: DC
  Administered 2020-09-07: 2 g via INTRAVENOUS
  Filled 2020-09-07: qty 2
  Filled 2020-09-07 (×2): qty 20

## 2020-09-07 MED ORDER — ACETAMINOPHEN 325 MG PO TABS
650.0000 mg | ORAL_TABLET | Freq: Four times a day (QID) | ORAL | Status: DC | PRN
  Administered 2020-09-07 – 2020-09-15 (×5): 650 mg via ORAL
  Filled 2020-09-07 (×5): qty 2

## 2020-09-07 MED ORDER — VANCOMYCIN HCL IN DEXTROSE 1-5 GM/200ML-% IV SOLN: 1000.0000 mg | Freq: Once | INTRAVENOUS | Status: DC

## 2020-09-07 MED ORDER — VANCOMYCIN HCL 1500 MG/300ML IV SOLN
1500.0000 mg | INTRAVENOUS | Status: DC
  Filled 2020-09-07: qty 300

## 2020-09-07 MED ORDER — INSULIN ASPART 100 UNIT/ML IJ SOLN
0.0000 [IU] | Freq: Three times a day (TID) | INTRAMUSCULAR | Status: DC
  Administered 2020-09-07 (×3): 1 [IU] via SUBCUTANEOUS
  Administered 2020-09-07: 2 [IU] via SUBCUTANEOUS
  Administered 2020-09-08: 1 [IU] via SUBCUTANEOUS
  Administered 2020-09-08: 2 [IU] via SUBCUTANEOUS
  Administered 2020-09-09: 1 [IU] via SUBCUTANEOUS
  Administered 2020-09-09 – 2020-09-10 (×4): 2 [IU] via SUBCUTANEOUS
  Administered 2020-09-11: 1 [IU] via SUBCUTANEOUS
  Administered 2020-09-11 (×2): 2 [IU] via SUBCUTANEOUS
  Administered 2020-09-11 – 2020-09-13 (×8): 1 [IU] via SUBCUTANEOUS
  Administered 2020-09-14 – 2020-09-15 (×2): 2 [IU] via SUBCUTANEOUS
  Filled 2020-09-07 (×23): qty 1

## 2020-09-07 MED ORDER — TRAZODONE HCL 50 MG PO TABS: 25.0000 mg | ORAL_TABLET | Freq: Every evening | ORAL | Status: DC | PRN

## 2020-09-07 MED ORDER — CEFAZOLIN SODIUM-DEXTROSE 2-4 GM/100ML-% IV SOLN
2.0000 g | Freq: Three times a day (TID) | INTRAVENOUS | Status: DC
  Administered 2020-09-08 – 2020-09-12 (×13): 2 g via INTRAVENOUS
  Filled 2020-09-07 (×14): qty 100

## 2020-09-07 MED ORDER — GUAIFENESIN-DM 100-10 MG/5ML PO SYRP
10.0000 mL | ORAL_SOLUTION | ORAL | Status: DC | PRN
  Filled 2020-09-07: qty 10

## 2020-09-07 MED ORDER — VANCOMYCIN HCL 1500 MG/300ML IV SOLN
1500.0000 mg | Freq: Once | INTRAVENOUS | Status: AC
  Administered 2020-09-07: 1500 mg via INTRAVENOUS
  Filled 2020-09-07: qty 300

## 2020-09-07 MED ORDER — HYDRALAZINE HCL 50 MG PO TABS
50.0000 mg | ORAL_TABLET | Freq: Three times a day (TID) | ORAL | Status: DC
  Administered 2020-09-07 – 2020-09-10 (×8): 50 mg via ORAL
  Filled 2020-09-07 (×9): qty 1

## 2020-09-07 MED ORDER — ALBUTEROL SULFATE (2.5 MG/3ML) 0.083% IN NEBU
2.5000 mg | INHALATION_SOLUTION | Freq: Four times a day (QID) | RESPIRATORY_TRACT | Status: DC | PRN
  Filled 2020-09-07: qty 3

## 2020-09-07 MED ORDER — MAGNESIUM HYDROXIDE 400 MG/5ML PO SUSP: 30.0000 mL | Freq: Every day | ORAL | Status: DC | PRN

## 2020-09-07 MED ORDER — HYDROCERIN EX CREA
TOPICAL_CREAM | Freq: Every day | CUTANEOUS | Status: DC
  Filled 2020-09-07 (×2): qty 113

## 2020-09-07 MED ORDER — METOPROLOL SUCCINATE ER 25 MG PO TB24
25.0000 mg | ORAL_TABLET | Freq: Every day | ORAL | Status: DC
  Administered 2020-09-07 – 2020-09-08 (×2): 25 mg via ORAL
  Filled 2020-09-07 (×2): qty 1

## 2020-09-07 MED ORDER — ENOXAPARIN SODIUM 80 MG/0.8ML IJ SOSY
0.5000 mg/kg | PREFILLED_SYRINGE | INTRAMUSCULAR | Status: DC
  Administered 2020-09-07 – 2020-09-15 (×9): 67.5 mg via SUBCUTANEOUS
  Filled 2020-09-07 (×12): qty 0.68

## 2020-09-07 MED ORDER — SACUBITRIL-VALSARTAN 49-51 MG PO TABS
1.0000 | ORAL_TABLET | Freq: Two times a day (BID) | ORAL | Status: DC
  Administered 2020-09-07 – 2020-09-10 (×8): 1 via ORAL
  Filled 2020-09-07 (×10): qty 1

## 2020-09-07 MED ORDER — FUROSEMIDE 40 MG PO TABS
40.0000 mg | ORAL_TABLET | Freq: Every day | ORAL | Status: DC
  Administered 2020-09-07 – 2020-09-15 (×9): 40 mg via ORAL
  Filled 2020-09-07 (×9): qty 1

## 2020-09-07 MED ORDER — INSULIN GLARGINE 100 UNIT/ML ~~LOC~~ SOLN
20.0000 [IU] | Freq: Two times a day (BID) | SUBCUTANEOUS | Status: DC
  Administered 2020-09-07 – 2020-09-13 (×14): 20 [IU] via SUBCUTANEOUS
  Filled 2020-09-07 (×16): qty 0.2

## 2020-09-07 MED ORDER — SODIUM CHLORIDE 0.9 % IV SOLN
1.0000 g | INTRAVENOUS | Status: AC
  Administered 2020-09-07: 1 g via INTRAVENOUS
  Filled 2020-09-07: qty 10

## 2020-09-07 MED ORDER — ALBUTEROL SULFATE HFA 108 (90 BASE) MCG/ACT IN AERS: 2.0000 | INHALATION_SPRAY | Freq: Once | RESPIRATORY_TRACT | Status: DC | PRN

## 2020-09-07 MED ORDER — DORZOLAMIDE HCL-TIMOLOL MAL 2-0.5 % OP SOLN
1.0000 [drp] | Freq: Two times a day (BID) | OPHTHALMIC | Status: DC
  Administered 2020-09-07 – 2020-09-15 (×17): 1 [drp] via OPHTHALMIC
  Filled 2020-09-07 (×2): qty 10

## 2020-09-07 MED ORDER — LEVETIRACETAM 750 MG PO TABS
750.0000 mg | ORAL_TABLET | Freq: Two times a day (BID) | ORAL | Status: DC
  Administered 2020-09-07 – 2020-09-15 (×17): 750 mg via ORAL
  Filled 2020-09-07 (×19): qty 1

## 2020-09-07 MED ORDER — VANCOMYCIN HCL 1000 MG/200ML IV SOLN
1000.0000 mg | Freq: Once | INTRAVENOUS | Status: AC
  Administered 2020-09-07: 1000 mg via INTRAVENOUS
  Filled 2020-09-07: qty 200

## 2020-09-07 MED ORDER — ATORVASTATIN CALCIUM 20 MG PO TABS
20.0000 mg | ORAL_TABLET | Freq: Every day | ORAL | Status: DC
  Administered 2020-09-07 – 2020-09-15 (×9): 20 mg via ORAL
  Filled 2020-09-07 (×9): qty 1

## 2020-09-07 MED ORDER — LINAGLIPTIN 5 MG PO TABS
5.0000 mg | ORAL_TABLET | Freq: Every day | ORAL | Status: DC
  Administered 2020-09-07 – 2020-09-15 (×9): 5 mg via ORAL
  Filled 2020-09-07 (×11): qty 1

## 2020-09-07 MED ORDER — MORPHINE SULFATE (PF) 2 MG/ML IV SOLN
2.0000 mg | INTRAVENOUS | Status: DC | PRN
  Administered 2020-09-07 – 2020-09-10 (×5): 2 mg via INTRAVENOUS
  Filled 2020-09-07 (×5): qty 1

## 2020-09-07 MED ORDER — SODIUM CHLORIDE 0.9 % IV SOLN: INTRAVENOUS | Status: DC

## 2020-09-07 MED ORDER — VANCOMYCIN HCL 1500 MG/300ML IV SOLN
1500.0000 mg | Freq: Once | INTRAVENOUS | Status: DC
  Filled 2020-09-07: qty 300

## 2020-09-07 NOTE — ED Notes (Signed)
Applesauce and sprite provided

## 2020-09-07 NOTE — Consult Note (Addendum)
WOC Nurse Consult Note: Reason for Consult: Cellulitis of LEs in the presence of lymphedema, also intertriginous dermatitis. Known to the [facility] Nursing department from previous admissions. Areas of weeping.  Patient reports she did not keep appointment at the [facility] in April which offers comprehensive outpatient services for not only wound care, but for establishment and assistance with obtaining lymphedema pumps after aligning with therapists who can perform complete decongestive therapy including manual massage. It is not known, but it  is suspected that toenail debridement may also be offered at this setting. Greater than 30 minutes of this encounter was in the teaching and encouragement of the patient to avail herself of the previously arranged referral to that center. Wound type: venous insufficiency, lymphedema, infection Pressure Injury POA: N/A Measurement:N/A (very small areas of weeping indicate breaks in the integument, however no wounds are noted) Wound bed:N/A Drainage (amount, consistency, odor) weeping of serous fluid.  Patient reports this has decreased since arrival to hospital.  She sits in her chair all day and all night; here her LEs have been elevated. Periwound: Skin is with hypertrophy consistent with lymphedema diagnosis, feet with poor hygiene and toenails in need of debridement. Dressing procedure/placement/frequency: Due to the presence of weeping cellulitis, I have provided guidance for the topical care of the patient's LEs using a daily soap and water cleanse and rise.  Care is to be taken to gently wash between toes and in skin folds.  Currently there is caked powder in these areas which contributes to friction and bacterial overgrowth. Eucerin cream is to to applied to the hyperkeratotic areas and xeroform (antimicrobial, nonadherent) is to be applied over any areas of continued weeping and topped with ABD pads.  Legs are to be  wrapped with Kerlix from just below toes to just below knees (it will take 2 rolls per leg).  The Kerlix is to be topped with 6-inch ACE bandages (it will take 2 bandages per leg).  The feet are to be placed into Prevalon boots as floatation efforts are ineffective with pillows. A sacral foam is to be placed for PI prevention.  A bariatric mattress is provided to improve ability to turn and reposition in bed.  I have communicated the recommendation to reinforce need for outpatient comprehensive care at the [facility] to Dr. Lowell Guitar via Secure Chat.  WOC nursing team will not follow, but will remain available to this patient, the nursing and medical teams.  Please re-consult if needed. Thanks, Ladona Mow, MSN, RN, GNP, [place]  Pager# [phone]

## 2020-09-07 NOTE — Progress Notes (Signed)
Pharmacy Antibiotic Note  Grace Short is a 75 y.o. female admitted on 09/06/2020 with cellulitis.  Pharmacy has been consulted for cefazolin dosing.  Plan: Cefazolin 2g IV q8h Monitor clinical picture and renal function F/U C&S, abx deescalation / LOT   Height: 5\' 2"  (157.5 cm) Weight: (!) 149.1 kg (328 lb 11.3 oz) IBW/kg (Calculated) : 50.1  Temp (24hrs), Avg:98.7 F (37.1 C), Min:98.1 F (36.7 C), Max:99.2 F (37.3 C)  Recent Labs  Lab 09/06/20 1818 09/07/20 0535  WBC 16.2* 11.6*  CREATININE 0.85 0.79  LATICACIDVEN 1.6  --      Estimated Creatinine Clearance: 86 mL/min (by C-G formula based on SCr of 0.79 mg/dL).    Allergies  Allergen Reactions   Ativan [Lorazepam]     Antimicrobials this admission: 07/04 Ceftriaxone x1  07/04 Vancomycin x1 07/05 cefazolin >>  Microbiology results: 07/03 BCx: Pending 07/03 UCx: Pending  Thank you for allowing pharmacy to be a part of this patient's care.  09/03, PharmD 09/07/2020 9:19 PM

## 2020-09-07 NOTE — Progress Notes (Signed)
PROGRESS NOTE    Grace Short  WUJ:[id number] DOB: 03/05/1946 DOA: 09/06/2020 PCP: Shane CrutchMarkley, Linda, PA  Chief Complaint  Patient presents with   Leg Swelling    Brief Narrative: Grace Short is Grace Short 75 y.o. female with medical history significant for morbid obesity, chronic lymphedema, HFpEF, COPD, CVA, type diabetes mellitus, hypertension and dyslipidemia, who presented to the ER with acute onset of bilateral lower extremity worsening swelling with erythema, warmth and tenderness with bruising.  She denies any fever or chills.  No chest pain or dyspnea or cough or wheezing hemoptysis.  No dysuria, oliguria or hematuria or flank pain.   ED Course: When she came to the ER blood pressure was 143/67 with otherwise normal vital signs.  Labs revealed unremarkable CMP.  CBC showed leukocytosis 16.2 with neutrophilia and anemia close to baseline.   Imaging: Chest x-ray showed no acute cardiopulmonary disease.  The patient was given 650 mg p.o. Tylenol and Prisila Dlouhy gram of IV Rocephin.  She will be admitted to [facility] for further evaluation and management.  Assessment & Plan:   Active Problems:   Lower extremity cellulitis  1.  Bilateral Lower Extremity Cellulitis  Chronic Lymphedema - Will continue ceftriaxone/vanc x24 hrs, then transition to ancef - Pain management to be provided. - We will follow her leukocytosis. -- wound c/s appreciated - [facility]/wound care expressed (transportation she notes is an issue).  Appreciate wound care recs -> see below.  Wound care recs topical care of the patient's LEs using Rider Ermis daily soap and water cleanse and rise.  Care is to be taken to gently wash between toes and in skin folds.  Currently there is caked powder in these areas which contributes to friction and bacterial overgrowth. Eucerin cream is to to applied to the hyperkeratotic areas and xeroform (antimicrobial, nonadherent) is  to be applied over any areas of continued weeping and topped with ABD pads.  Legs are to be wrapped with Kerlix from just below toes to just below knees (it will take 2 rolls per leg).  The Kerlix is to be topped with 6-inch ACE bandages (it will take 2 bandages per leg).  The feet are to be placed into Prevalon boots as floatation efforts are ineffective with pillows. Laytoya Ion sacral foam is to be placed for PI prevention.  Julee Stoll bariatric mattress is provided to improve ability to turn and reposition in bed  2.  Hypertensive urgency. - BP improved, continue to monitor  -- continue entresto, metoprolol, lasix, hydralazine  3.  Dyslipidemia. - We will continue statin therapy.  4.  Type 2 diabetes mellitus. - We will place her on supplemental coverage with NovoLog and continue basal coverage. - Changed to Tradjenta  5.  HFpEF. - We will continue Lasix, Entresto and Toprol-XL.  6.  Seizure disorder. - We will continue Keppra.  7.  COPD. - We will continue her as needed albuterol.  DVT prophylaxis: lovenox Code Status: full  Family Communication: none at bedside Disposition:   Status is: Inpatient  Remains inpatient appropriate because:Inpatient level of care appropriate due to severity of illness  Dispo: The patient is from: Home              Anticipated d/c is to: Home              Patient currently is not medically stable to d/c.   Difficult to place patient No  Consultants:  none  Procedures:  none  Antimicrobials:  Anti-infectives (From admission, onward)    Start     Dose/Rate Route Frequency Ordered Stop   09/08/20 0600  vancomycin (VANCOREADY) IVPB 1500 mg/300 mL        1,500 mg 150 mL/hr over 120 Minutes Intravenous Every 24 hours 09/07/20 0517     09/07/20 1200  cefTRIAXone (ROCEPHIN) 2 g in sodium chloride 0.9 % 100 mL IVPB        2 g 200 mL/hr over 30 Minutes Intravenous Every 24 hours 09/07/20 0136     09/07/20 0615  vancomycin (VANCOREADY) IVPB 1500 mg/300  mL       See Hyperspace for full Linked Orders Report.   1,500 mg 150 mL/hr over 120 Minutes Intravenous  Once 09/07/20 0420 09/07/20 0828   09/07/20 0515  vancomycin (VANCOREADY) IVPB 1000 mg/200 mL       See Hyperspace for full Linked Orders Report.   1,000 mg 200 mL/hr over 60 Minutes Intravenous  Once 09/07/20 0420 09/07/20 0612   09/07/20 0230  vancomycin (VANCOREADY) IVPB 1500 mg/300 mL  Status:  Discontinued       See Hyperspace for full Linked Orders Report.   1,500 mg 150 mL/hr over 120 Minutes Intravenous  Once 09/07/20 0129 09/07/20 0420   09/07/20 0145  vancomycin (VANCOCIN) IVPB 1000 mg/200 mL premix  Status:  Discontinued        1,000 mg 200 mL/hr over 60 Minutes Intravenous  Once 09/07/20 0136 09/07/20 0157   09/07/20 0130  vancomycin (VANCOCIN) 2,500 mg in sodium chloride 0.9 % 500 mL IVPB  Status:  Discontinued        2,500 mg 250 mL/hr over 120 Minutes Intravenous  Once 09/07/20 0126 09/07/20 0127   09/07/20 0130  vancomycin (VANCOCIN) IVPB 1000 mg/200 mL premix  Status:  Discontinued       See Hyperspace for full Linked Orders Report.   1,000 mg 200 mL/hr over 60 Minutes Intravenous  Once 09/07/20 0128 09/07/20 0420   09/07/20 0115  cefTRIAXone (ROCEPHIN) 1 g in sodium chloride 0.9 % 100 mL IVPB        1 g 200 mL/hr over 30 Minutes Intravenous STAT 09/07/20 0114 09/07/20 0203          Subjective: Presented due to swelling in legs, redness, ozzing  Objective: Vitals:   09/07/20 0334 09/07/20 0400 09/07/20 0743 09/07/20 1134  BP: (!) 130/44  (!) 173/49 (!) 131/54  Pulse: 78  81 86  Resp: 18  17 16   Temp: 98.1 F (36.7 C)  98.8 F (37.1 C) 99.2 F (37.3 C)  TempSrc: Oral  Oral Oral  SpO2: 100%  98% 98%  Weight:  (!) 149.1 kg    Height:  5\' 2"  (1.575 m)      Intake/Output Summary (Last 24 hours) at 09/07/2020 1306 Last data filed at 09/07/2020 0640 Gross per 24 hour  Intake 149.91 ml  Output 200 ml  Net -50.09 ml   Filed Weights   09/06/20 1814  09/07/20 0400  Weight: 136.1 kg (!) 149.1 kg    Examination:  General exam: Appears calm and comfortable, morbidly obese Respiratory system: unlabored Cardiovascular system: RRR Gastrointestinal system: Abdomen is protuberant Central nervous system: Alert and oriented. No focal neurological deficits. Extremities: bilateral lymphedema, areas of redness c/w cellulitis bilaterally Psychiatry: Judgement and insight appear normal. Mood & affect appropriate.     Data Reviewed: I have personally reviewed following labs and imaging studies  CBC: Recent Labs  Lab 09/06/20 1818 09/07/20 0535  WBC 16.2* 11.6*  NEUTROABS 14.0*  --   HGB 10.8* 10.2*  HCT 34.9* 32.1*  MCV 84.1 81.9  PLT 284 267    Basic Metabolic Panel: Recent Labs  Lab 09/06/20 1818 09/07/20 0535  NA 140 140  K 3.9 4.0  CL 105 107  CO2 25 25  GLUCOSE 129* 152*  BUN 15 13  CREATININE 0.85 0.79  CALCIUM 8.9 8.4*    GFR: Estimated Creatinine Clearance: 86 mL/min (by C-G formula based on SCr of 0.79 mg/dL).  Liver Function Tests: Recent Labs  Lab 09/06/20 1818  AST 26  ALT 11  ALKPHOS 53  BILITOT 0.9  PROT 7.5  ALBUMIN 3.3*    CBG: Recent Labs  Lab 09/07/20 0410 09/07/20 0743 09/07/20 1132  GLUCAP 144* 145* 128*     Recent Results (from the past 240 hour(s))  Blood Culture (routine x 2)     Status: None (Preliminary result)   Collection Time: 09/06/20  6:18 PM   Specimen: BLOOD  Result Value Ref Range Status   Specimen Description BLOOD RIGHT ANTECUBITAL  Final   Special Requests   Final    BOTTLES DRAWN AEROBIC AND ANAEROBIC Blood Culture adequate volume   Culture   Final    NO GROWTH < 12 HOURS Performed at [facility], [address], [place], [place] [address]    Report Status PENDING  Incomplete  Blood Culture (routine x 2)     Status: None (Preliminary result)   Collection Time: 09/06/20  6:18 PM   Specimen: BLOOD  Result Value Ref Range Status   Specimen  Description BLOOD BLOOD LEFT WRIST  Final   Special Requests   Final    BOTTLES DRAWN AEROBIC AND ANAEROBIC Blood Culture adequate volume   Culture   Final    NO GROWTH < 12 HOURS Performed at [facility], [address], [place], [place] [address]    Report Status PENDING  Incomplete  Resp Panel by RT-PCR (Flu Shalina Norfolk&B, Covid) Nasopharyngeal Swab     Status: None   Collection Time: 09/07/20  1:37 AM   Specimen: Nasopharyngeal Swab; Nasopharyngeal(NP) swabs in vial transport medium  Result Value Ref Range Status   SARS Coronavirus 2 by RT PCR NEGATIVE NEGATIVE Final    Comment: (NOTE) SARS-CoV-2 target nucleic acids are NOT DETECTED.  The SARS-CoV-2 RNA is generally detectable in upper respiratory specimens during the acute phase of infection. The lowest concentration of SARS-CoV-2 viral copies this assay can detect is 138 copies/mL. Kamille Toomey negative result does not preclude SARS-Cov-2 infection and should not be used as the sole basis for treatment or other patient management decisions. Tria Noguera negative result may occur with  improper specimen collection/handling, submission of specimen other than nasopharyngeal swab, presence of viral mutation(s) within the areas targeted by this assay, and inadequate number of viral copies(<138 copies/mL). Monigue Spraggins negative result must be combined with clinical observations, patient history, and epidemiological information. The expected result is Negative.  Fact Sheet for Patients:  BloggerCourse.com  Fact Sheet for Healthcare Providers:  SeriousBroker.it  This test is no t yet approved or cleared by the [place] FDA and  has been authorized for detection and/or diagnosis of SARS-CoV-2 by FDA under an Emergency Use Authorization (EUA). This EUA will remain  in effect (meaning this test can be used) for the duration of the COVID-19 declaration under Section 564(b)(1) of the Act, 21 U.S.C.section  360bbb-3(b)(1), unless  the authorization is terminated  or revoked sooner.       Influenza Drew Lips by PCR NEGATIVE NEGATIVE Final   Influenza B by PCR NEGATIVE NEGATIVE Final    Comment: (NOTE) The Xpert Xpress SARS-CoV-2/FLU/RSV plus assay is intended as an aid in the diagnosis of influenza from Nasopharyngeal swab specimens and should not be used as Trula Frede sole basis for treatment. Nasal washings and aspirates are unacceptable for Xpert Xpress SARS-CoV-2/FLU/RSV testing.  Fact Sheet for Patients: BloggerCourse.com  Fact Sheet for Healthcare Providers: SeriousBroker.it  This test is not yet approved or cleared by the [place] FDA and has been authorized for detection and/or diagnosis of SARS-CoV-2 by FDA under an Emergency Use Authorization (EUA). This EUA will remain in effect (meaning this test can be used) for the duration of the COVID-19 declaration under Section 564(b)(1) of the Act, 21 U.S.C. section 360bbb-3(b)(1), unless the authorization is terminated or revoked.  Performed at [facility], [address], [place], [place] [address]          Radiology Studies: [facility] Chest [facility] 1 View  Result Date: 09/07/2020 CLINICAL DATA:  Severe bilateral lower extremity swelling. EXAM: PORTABLE CHEST 1 VIEW COMPARISON:  12/10/2019 FINDINGS: The heart size and mediastinal contours are within normal limits. Both lungs are clear. The visualized skeletal structures are unremarkable. IMPRESSION: No active disease. Electronically Signed   By: Deatra Robinson M.D.   On: 09/07/2020 01:41        Scheduled Meds:  atorvastatin  20 mg Oral Daily   dorzolamide-timolol  1 drop Both Eyes BID   enoxaparin (LOVENOX) injection  0.5 mg/kg Subcutaneous Q24H   furosemide  40 mg Oral Daily   hydrALAZINE  50 mg Oral Q8H   hydrocerin   Topical Daily   insulin aspart  0-9 Units Subcutaneous TID AC & HS   insulin glargine  20 Units Subcutaneous  BID   levETIRAcetam  750 mg Oral BID   linagliptin  5 mg Oral Daily   metoprolol succinate  25 mg Oral Daily   sacubitril-valsartan  1 tablet Oral BID   Continuous Infusions:  sodium chloride 75 mL/hr at 09/07/20 0611   cefTRIAXone (ROCEPHIN)  IV     [START ON 09/08/2020] vancomycin       LOS: 0 days    Time spent: over 30 min    Lacretia Nicks, MD [facility]   To contact the attending provider between 7A-7P or the covering provider during after hours 7P-7A, please log into the web site [email or web address] and access using universal [place] password for that web site. If you do not have the password, please call the hospital operator.  09/07/2020, 1:06 PM

## 2020-09-07 NOTE — Progress Notes (Signed)
Pharmacy Antibiotic Note  Grace Short is a 75 y.o. female admitted on 09/06/2020 with cellulitis.  Pharmacy has been consulted for Vancomycin dosing.  Plan: Pt order initial dose of 2500 mg based on TBW: 136.1 kg  Vancomycin 1500 mg IV Q 24 hrs.  Goal AUC 400-550. Expected AUC: 526.6 SCr used: 0.85, Vd used 0.5  Pharmacy will continue to follow and adjust abx dosing whenever warranted.   Height: 5\' 2"  (157.5 cm) Weight: 136.1 kg (300 lb) IBW/kg (Calculated) : 50.1  Temp (24hrs), Avg:98.6 F (37 C), Min:98.4 F (36.9 C), Max:98.8 F (37.1 C)  Recent Labs  Lab 09/06/20 1818  WBC 16.2*  CREATININE 0.85  LATICACIDVEN 1.6    Estimated Creatinine Clearance: 76.3 mL/min (by C-G formula based on SCr of 0.85 mg/dL).    Allergies  Allergen Reactions   Ativan [Lorazepam]     Antimicrobials this admission: 07/04 Ceftriaxone >>  07/04 Vancomycin >>   Microbiology results: 07/03 BCx: Pending 07/03 UCx: Pending  Thank you for allowing pharmacy to be a part of this patient's care.  09/03, PharmD, [facility] 09/07/2020 5:15 AM

## 2020-09-07 NOTE — Progress Notes (Signed)
PHARMACIST - PHYSICIAN COMMUNICATION  CONCERNING:  Enoxaparin (Lovenox) for DVT Prophylaxis    RECOMMENDATION: Patient was prescribed enoxaprin 40mg  q24 hours for VTE prophylaxis.   Filed Weights   09/06/20 1814  Weight: 136.1 kg (300 lb)    Body mass index is 54.87 kg/m.  Estimated Creatinine Clearance: 76.3 mL/min (by C-G formula based on SCr of 0.85 mg/dL).   Based on [facility] policy patient is candidate for enoxaparin 0.5mg /kg TBW SQ every 24 hours based on BMI being >30.  DESCRIPTION: Pharmacy has adjusted enoxaparin dose per [facility] policy.  Patient is now receiving enoxaparin 0.5 mg/kg every 24 hours   [facility], PharmD, [facility] 09/07/2020 1:55 AM

## 2020-09-07 NOTE — H&P (Signed)
[place]   PATIENT NAME: Grace Short    MR#:  [id number]  DATE OF BIRTH:  07/05/1945  DATE OF ADMISSION:  09/06/2020  PRIMARY CARE PHYSICIAN: Shane Crutch, PA   Patient is coming from: Home  REQUESTING/REFERRING PHYSICIAN: Loleta Rose, MD  CHIEF COMPLAINT:   Chief Complaint  Patient presents with  . Leg Swelling    HISTORY OF PRESENT ILLNESS:  Grace Short is a 75 y.o. female with medical history significant for morbid obesity, chronic lymphedema, HFpEF, COPD, CVA, type diabetes mellitus, hypertension and dyslipidemia, who presented to the ER with acute onset of bilateral lower extremity worsening swelling with erythema, warmth and tenderness with bruising.  She denies any fever or chills.  No chest pain or dyspnea or cough or wheezing hemoptysis.  No dysuria, oliguria or hematuria or flank pain.  ED Course: When she came to the ER blood pressure was 143/67 with otherwise normal vital signs.  Labs revealed unremarkable CMP.  CBC showed leukocytosis 16.2 with neutrophilia and anemia close to baseline.  Imaging: Chest x-ray showed no acute cardiopulmonary disease.  The patient was given 650 mg p.o. Tylenol and a gram of IV Rocephin.  She will be admitted to a medical bed for further evaluation and management. PAST MEDICAL HISTORY:   Past Medical History:  Diagnosis Date  . Chronic acquired lymphedema   . Chronic heart failure with preserved ejection fraction (HFpEF) (HCC)   . COPD (chronic obstructive pulmonary disease) (HCC)   . CVA (cerebral vascular accident) (HCC)   . DM (diabetes mellitus) (HCC)   . HLD (hyperlipidemia)   . Hypertension   . Morbid obesity with BMI of 50.0-59.9, adult (HCC)   . Partial seizure disorder (HCC)   . SSS (sick sinus syndrome) (HCC)     PAST SURGICAL HISTORY:   Past Surgical History:  Procedure Laterality Date  . APPENDECTOMY    . BREAST SURGERY    . colectomy     rectal surgery  . HERNIA REPAIR    . PACEMAKER IMPLANT     . TONSILLECTOMY      SOCIAL HISTORY:   Social History   Tobacco Use  . Smoking status: Never  . Smokeless tobacco: Never  Substance Use Topics  . Alcohol use: Never    FAMILY HISTORY:   Family History  Problem Relation Age of Onset  . Diabetes Sister   . Heart disease Sister     DRUG ALLERGIES:   Allergies  Allergen Reactions  . Ativan [Lorazepam]     REVIEW OF SYSTEMS:   ROS As per history of present illness. All pertinent systems were reviewed above. Constitutional, HEENT, cardiovascular, respiratory, GI, GU, musculoskeletal, neuro, psychiatric, endocrine, integumentary and hematologic systems were reviewed and are otherwise negative/unremarkable except for positive findings mentioned above in the HPI.   MEDICATIONS AT HOME:   Prior to Admission medications   Medication Sig Start Date End Date Taking? Authorizing Provider  acetaminophen (TYLENOL) 325 MG tablet Take 650 mg by mouth 2 (two) times daily as needed.    [provider]  albuterol (VENTOLIN HFA) 108 (90 Base) MCG/ACT inhaler Inhale 2 puffs into the lungs once as needed for wheezing or shortness of breath (for Grade 3 or 4 hypersensitivity reaction). 12/14/19   Burnadette Pop, MD  atorvastatin (LIPITOR) 20 MG tablet Take 20 mg by mouth daily. Patient not taking: Reported on 04/21/2020 08/22/19   [provider]  dorzolamide-timolol (COSOPT) 22.3-6.8 MG/ML ophthalmic solution Place 1  drop into both eyes 2 (two) times daily.    [provider]  ENTRESTO 49-51 MG Take 1 tablet by mouth 2 (two) times daily. 11/18/19   [provider]  furosemide (LASIX) 40 MG tablet Take 1 tablet (40 mg total) by mouth daily. 12/14/19   Burnadette Pop, MD  guaiFENesin-dextromethorphan (ROBITUSSIN DM) 100-10 MG/5ML syrup Take 10 mLs by mouth every 4 (four) hours as needed for cough. 12/14/19   Burnadette Pop, MD  hydrALAZINE (APRESOLINE) 50 MG tablet Take 1 tablet (50 mg total) by mouth every 8  (eight) hours. Patient not taking: No sig reported 12/14/19   Burnadette Pop, MD  insulin glargine (LANTUS) 100 UNIT/ML injection Inject 20 Units into the skin 2 (two) times daily.    [provider]  insulin lispro (HUMALOG) 100 UNIT/ML injection Inject 10 Units into the skin See admin instructions. Inject 10u under the skin three times daily at mealtimes (plus applicable sliding scale) - do not use if blood glucose <150    [provider]  levETIRAcetam (KEPPRA) 750 MG tablet Take 750 mg by mouth 2 (two) times daily. 10/31/19   [provider]  linagliptin (TRADJENTA) 5 MG TABS tablet Take 5 mg by mouth daily.    [provider]  metoprolol succinate (TOPROL-XL) 25 MG 24 hr tablet Take 25 mg by mouth daily. 10/15/19   [provider]  nystatin cream (MYCOSTATIN) Apply 1 application topically daily. Patient not taking: Reported on 04/21/2020    [provider]  potassium chloride SA (KLOR-CON) 20 MEQ tablet Take 2 tablets (40 mEq total) by mouth daily. Patient not taking: Reported on 04/21/2020 12/14/19   Burnadette Pop, MD  predniSONE (DELTASONE) 10 MG tablet Take 1 tablet (10 mg total) by mouth daily. Take 4 pills daily for 3 days then 2 pills daily for 3 days then 1 pill daily for 3 days then stop. Patient not taking: Reported on 04/21/2020 12/14/19   Burnadette Pop, MD      VITAL SIGNS:  Blood pressure (!) 154/52, pulse 83, temperature 98.4 F (36.9 C), temperature source Oral, resp. rate 18, height 5\' 2"  (1.575 m), weight 136.1 kg, SpO2 98 %.  PHYSICAL EXAMINATION:  Physical Exam  GENERAL:  75 y.o.-year-old patient lying in the bed with no acute distress.  EYES: Pupils equal, round, reactive to light and accommodation. No scleral icterus. Extraocular muscles intact.  HEENT: Head atraumatic, normocephalic. Oropharynx and nasopharynx clear.  NECK:  Supple, no jugular venous distention. No thyroid enlargement, no tenderness.  LUNGS:  Normal breath sounds bilaterally, no wheezing, rales,rhonchi or crepitation. No use of accessory muscles of respiration.  CARDIOVASCULAR: Regular rate and rhythm, S1, S2 normal. No murmurs, rubs, or gallops.  ABDOMEN: Soft, nondistended, nontender. Bowel sounds present. No organomegaly or mass.  EXTREMITIES: Bilateral lower extremity lymphedema with elephantiasis with no cyanosis, or clubbing.  NEUROLOGIC: Cranial nerves II through XII are intact. Muscle strength 5/5 in all extremities. Sensation intact. Gait not checked.  PSYCHIATRIC: The patient is alert and oriented x 3.  Normal affect and good eye contact. SKIN: Severe lymphedema with elephantiasis and associated erythema with lichenification, warmth, tenderness with yellowish oozing discharge on her leg dressings.  She has diffuse mycosis.   LABORATORY PANEL:   CBC Recent Labs  Lab 09/06/20 1818  WBC 16.2*  HGB 10.8*  HCT 34.9*  PLT 284   ------------------------------------------------------------------------------------------------------------------  Chemistries  Recent Labs  Lab 09/06/20 1818  NA 140  K 3.9  CL 105  CO2 25  GLUCOSE 129*  BUN 15  CREATININE 0.85  CALCIUM 8.9  AST 26  ALT 11  ALKPHOS 53  BILITOT 0.9   ------------------------------------------------------------------------------------------------------------------  Cardiac Enzymes No results for input(s): TROPONINI in the last 168 hours. ------------------------------------------------------------------------------------------------------------------  RADIOLOGY:  No results found.    IMPRESSION AND PLAN:  Active Problems:   Lower extremity cellulitis  1.  Bilateral lower extremity moderate nonpurulent cellulitis in the setting of severe lymphedema with elephantiasis. - The patient will be admitted to a medical bed. - We will continue antibiotic therapy with IV Rocephin and add IV vancomycin being a MRSA risk. - Pain management to be  provided. - We will follow her leukocytosis.  2.  Hypertensive urgency. - This could be secondary to pain. - We will continue antihypertensive and place her on as needed IV labetalol. - Adequate pain management.  3.  Dyslipidemia. - We will continue statin therapy.  4.  Type 2 diabetes mellitus. - We will place her on supplemental coverage with NovoLog and continue basal coverage. - Changed to Tradjenta  5.  HFpEF. - We will continue Lasix, Entresto and Toprol-XL.  6.  Seizure disorder. - We will continue Keppra.  7.  COPD. - We will continue her as needed albuterol.   DVT prophylaxis: Lovenox. Code Status: full code. Family Communication:  The plan of care was discussed in details with the patient (and family). I answered all questions. The patient agreed to proceed with the above mentioned plan. Further management will depend upon hospital course. Disposition Plan: Back to previous home environment Consults called: none. All the records are reviewed and case discussed with ED provider.  Status is: Inpatient  Remains inpatient appropriate because:Ongoing active pain requiring inpatient pain management, Ongoing diagnostic testing needed not appropriate for outpatient work up, Unsafe d/c plan, IV treatments appropriate due to intensity of illness or inability to take PO, and Inpatient level of care appropriate due to severity of illness  Dispo: The patient is from: Home              Anticipated d/c is to: Home              Patient currently is not medically stable to d/c.   Difficult to place patient No    TOTAL TIME TAKING CARE OF THIS PATIENT: 55 minutes.    Hannah Beat M.D on 09/07/2020 at 1:37 AM  [facility]   From 7 PM-7 AM, contact night-coverage [email or web address]  CC: Primary care physician; Shane Crutch, PA

## 2020-09-07 NOTE — Plan of Care (Signed)
°  Problem: Education: °Goal: Ability to demonstrate management of disease process will improve °Outcome: Progressing °Goal: Ability to verbalize understanding of medication therapies will improve °Outcome: Progressing °Goal: Individualized Educational Video(s) °Outcome: Progressing °  °

## 2020-09-07 NOTE — Progress Notes (Signed)
PHARMACY -  BRIEF ANTIBIOTIC NOTE   Pharmacy has received consult(s) for Vancomycin from an ED provider.  The patient's profile has been reviewed for ht/wt/allergies/indication/available labs.    One time order placed for Vancomycin 2500 mg per pt wt: 136 kg  Further antibiotics/pharmacy consults should be ordered by admitting physician if indicated.        Otelia Sergeant, PharmD, MBA 09/07/2020 1:30 AM

## 2020-09-07 NOTE — ED Provider Notes (Signed)
[facility] Emergency Department Provider Note  ____________________________________________   Event Date/Time   First MD Initiated Contact with Patient 09/06/20 2324     (approximate)  I have reviewed the triage vital signs and the nursing notes.   HISTORY  Chief Complaint Leg Swelling    HPI Grace Short is a 75 y.o. female with medical history as listed below which notably includes morbid obesity and chronic acquired lymphedema as well as some heart failure.  She presents for evaluation of worsening swelling, pain, and weeping wounds on her lower extremities.  She is somewhat of a vague historian but it seems as if the symptoms have been gradually worsening over time.  She claims that she lost home health care so the bandages have not been changed in 2 weeks but they are starting to have a foul smell and starting to weep.  She usually is able to walk but said that it has become very hard to do so.  She believes that she had a fever yesterday but is not sure although reportedly her facility reported to EMS that she did have a fever.  She denies chest pain or shortness of breath at rest but says that she has shortness of breath with any amount of exertion.  She denies abdominal pain.  She said that her legs are severe and nothing in particular makes them better.     Past Medical History:  Diagnosis Date   Chronic acquired lymphedema    Chronic heart failure with preserved ejection fraction (HFpEF) (HCC)    COPD (chronic obstructive pulmonary disease) (HCC)    CVA (cerebral vascular accident) (HCC)    DM (diabetes mellitus) (HCC)    HLD (hyperlipidemia)    Hypertension    Morbid obesity with BMI of 50.0-59.9, adult (HCC)    Partial seizure disorder (HCC)    SSS (sick sinus syndrome) (HCC)     Patient Active Problem List   Diagnosis Date Noted   Lower extremity cellulitis 09/07/2020   Hematuria    Lymphedema    Type 2 diabetes mellitus with  hyperlipidemia (HCC)    Morbid obesity with BMI of 50.0-59.9, adult (HCC)    Weakness    Chronic heart failure with preserved ejection fraction (HFpEF) (HCC) 12/07/2019   Edema of both lower extremities due to peripheral venous insufficiency 12/07/2019   Stasis edema with ulcer and inflammation, bilateral (HCC) 12/07/2019   SSS (sick sinus syndrome) (HCC) 12/07/2019   Pneumonia due to COVID-19 virus 12/07/2019   HTN (hypertension) 12/07/2019   COPD (chronic obstructive pulmonary disease) (HCC) 12/07/2019   Acute respiratory failure with hypoxemia (HCC) 12/07/2019   Glaucoma 12/20/2017   Pacemaker 12/20/2017   Personal history of transient ischemic attack (TIA), and cerebral infarction without residual deficits 12/20/2017   Seizure disorder (HCC) 12/20/2017   Sleep apnea, unspecified 12/20/2017   Hypertension 12/20/2017    Past Surgical History:  Procedure Laterality Date   APPENDECTOMY     BREAST SURGERY     colectomy     rectal surgery   HERNIA REPAIR     PACEMAKER IMPLANT     TONSILLECTOMY      Prior to Admission medications   Medication Sig Start Date End Date Taking? Authorizing Provider  acetaminophen (TYLENOL) 325 MG tablet Take 650 mg by mouth 2 (two) times daily as needed.   Yes [provider]  albuterol (VENTOLIN HFA) 108 (90 Base) MCG/ACT inhaler Inhale 2 puffs into the lungs once as needed  for wheezing or shortness of breath (for Grade 3 or 4 hypersensitivity reaction). 12/14/19  Yes Burnadette Pop, MD  atorvastatin (LIPITOR) 20 MG tablet Take 20 mg by mouth daily. 08/22/19  Yes [provider]  cholecalciferol (VITAMIN D3) 25 MCG (1000 UNIT) tablet Take 1,000 Units by mouth every other day.   Yes [provider]  dorzolamide-timolol (COSOPT) 22.3-6.8 MG/ML ophthalmic solution Place 1 drop into both eyes 2 (two) times daily.   Yes [provider]  ENTRESTO 49-51 MG Take 1 tablet by mouth daily. 11/18/19  Yes [provider]   furosemide (LASIX) 40 MG tablet Take 1 tablet (40 mg total) by mouth daily. 12/14/19  Yes Adhikari, Willia Craze, MD  insulin glargine (LANTUS) 100 UNIT/ML injection Inject 20 Units into the skin 2 (two) times daily.   Yes [provider]  insulin lispro (HUMALOG) 100 UNIT/ML injection Inject 10 Units into the skin See admin instructions. Inject 10u under the skin three times daily at mealtimes (plus applicable sliding scale) - do not use if blood glucose <150   Yes [provider]  levETIRAcetam (KEPPRA) 750 MG tablet Take 750 mg by mouth 2 (two) times daily. 10/31/19  Yes [provider]  linagliptin (TRADJENTA) 5 MG TABS tablet Take 5 mg by mouth daily.   Yes [provider]  loratadine (CLARITIN) 10 MG tablet Take 10 mg by mouth daily as needed for allergies.   Yes [provider]  metoprolol succinate (TOPROL-XL) 25 MG 24 hr tablet Take 25 mg by mouth daily. 10/15/19  Yes [provider]  nystatin (MYCOSTATIN/NYSTOP) powder Apply 1 application topically daily as needed.   Yes [provider]  traMADol (ULTRAM) 50 MG tablet Take 50 mg by mouth 4 (four) times daily as needed. 08/21/20  Yes [provider]  vitamin B-12 (CYANOCOBALAMIN) 100 MCG tablet Take 100 mcg by mouth every other day.   Yes [provider]  vitamin C (ASCORBIC ACID) 250 MG tablet Take 250 mg by mouth every other day.   Yes [provider]  guaiFENesin-dextromethorphan (ROBITUSSIN DM) 100-10 MG/5ML syrup Take 10 mLs by mouth every 4 (four) hours as needed for cough. Patient not taking: No sig reported 12/14/19   Burnadette Pop, MD  hydrALAZINE (APRESOLINE) 50 MG tablet Take 1 tablet (50 mg total) by mouth every 8 (eight) hours. Patient not taking: No sig reported 12/14/19   Burnadette Pop, MD  potassium chloride SA (KLOR-CON) 20 MEQ tablet Take 2 tablets (40 mEq total) by mouth daily. Patient not taking: No sig reported 12/14/19   Burnadette Pop,  MD  predniSONE (DELTASONE) 10 MG tablet Take 1 tablet (10 mg total) by mouth daily. Take 4 pills daily for 3 days then 2 pills daily for 3 days then 1 pill daily for 3 days then stop. Patient not taking: No sig reported 12/14/19   Burnadette Pop, MD    Allergies Ativan [lorazepam]  Family History  Problem Relation Age of Onset   Diabetes Sister    Heart disease Sister     Social History Social History   Tobacco Use   Smoking status: Never   Smokeless tobacco: Never  Vaping Use   Vaping Use: Never used  Substance Use Topics   Alcohol use: Never   Drug use: Never    Review of Systems Constitutional: No fever/chills Eyes: No visual changes. ENT: No sore throat. Cardiovascular: Denies chest pain. Respiratory: Dyspnea on exertion. Gastrointestinal: No abdominal pain.  No nausea, no vomiting.  No diarrhea.  No constipation. Genitourinary: Negative for dysuria. Musculoskeletal: Chronic lower extremity lymphedema and pain. Integumentary: Chronic skin changes of lymphedema with weeping from wounds. Neurological: Negative for headaches, focal weakness or numbness.   ____________________________________________   PHYSICAL EXAM:  VITAL SIGNS: ED Triage Vitals  Enc Vitals Group     BP 09/06/20 1813 (!) 143/67     Pulse Rate 09/06/20 1813 89     Resp 09/06/20 1813 20     Temp 09/06/20 1813 98.7 F (37.1 C)     Temp Source 09/06/20 1813 Oral     SpO2 09/06/20 1813 98 %     Weight 09/06/20 1814 136.1 kg (300 lb)     Height 09/06/20 1814 1.575 m (5\' 2" )     Head Circumference --      Peak Flow --      Pain Score 09/06/20 1814 5     Pain Loc --      Pain Edu? --      Excl. in GC? --     Constitutional: Alert and oriented.  Eyes: Conjunctivae are normal.  Head: Atraumatic. Nose: No congestion/rhinnorhea. Mouth/Throat: Patient is wearing a mask. Neck: No stridor.  No meningeal signs.   Cardiovascular: Normal rate, regular rhythm. Good peripheral  circulation. Respiratory: Normal respiratory effort.  No retractions. Gastrointestinal: Morbid obesity.  Nontender abdomen. Musculoskeletal: Chronic bilateral lower extremity lymphedema with redness and multiple open wounds with some degree of drainage.  Suspect a combination of stasis dermatitis and overlying cellulitis.  See photo below. Neurologic:  Normal speech and language. No gross focal neurologic deficits are appreciated.  Skin: Skin thickening with wounds consistent with stasis dermatitis and her chronic lymphedema, possible superimposed acute cellulitis.  See photo below.    ____________________________________________   LABS (all labs ordered are listed, but only abnormal results are displayed)  Labs Reviewed  COMPREHENSIVE METABOLIC PANEL - Abnormal; Notable for the following components:      Result Value   Glucose, Bld 129 (*)    Albumin 3.3 (*)    All other components within normal limits  CBC WITH DIFFERENTIAL/PLATELET - Abnormal; Notable for the following components:   WBC 16.2 (*)    Hemoglobin 10.8 (*)    HCT 34.9 (*)    Neutro Abs 14.0 (*)    Monocytes Absolute 1.2 (*)    Abs Immature Granulocytes 0.08 (*)    All other components within normal limits  URINALYSIS, COMPLETE (UACMP) WITH MICROSCOPIC - Abnormal; Notable for the following components:   Color, Urine YELLOW (*)    APPearance CLEAR (*)    Ketones, ur 80 (*)    Protein, ur 30 (*)    All other components within normal limits  BASIC METABOLIC PANEL - Abnormal; Notable for the following components:   Glucose, Bld 152 (*)    Calcium 8.4 (*)    All other components within normal limits  CBC - Abnormal; Notable for the following components:   WBC 11.6 (*)    Hemoglobin 10.2 (*)    HCT 32.1 (*)    All other components within normal limits  GLUCOSE, CAPILLARY - Abnormal; Notable for the following components:   Glucose-Capillary 144 (*)    All other components within normal limits  GLUCOSE, CAPILLARY -  Abnormal; Notable for the following components:   Glucose-Capillary 145 (*)    All other components within normal limits  CULTURE, BLOOD (ROUTINE X 2)  CULTURE, BLOOD (ROUTINE X 2)  RESP PANEL BY RT-PCR (FLU  A&B, COVID) ARPGX2  URINE CULTURE  LACTIC ACID, PLASMA  PROCALCITONIN  HEMOGLOBIN A1C   ____________________________________________   RADIOLOGY I, Loleta Rose, personally viewed and evaluated these images (plain radiographs) as part of my medical decision making, as well as reviewing the written report by the radiologist.  ED MD interpretation: No acute abnormalities identified on chest x-ray  Official radiology report(s): DG Chest Port 1 View  Result Date: 09/07/2020 CLINICAL DATA:  Severe bilateral lower extremity swelling. EXAM: PORTABLE CHEST 1 VIEW COMPARISON:  12/10/2019 FINDINGS: The heart size and mediastinal contours are within normal limits. Both lungs are clear. The visualized skeletal structures are unremarkable. IMPRESSION: No active disease. Electronically Signed   By: Deatra Robinson M.D.   On: 09/07/2020 01:41    ____________________________________________   PROCEDURES   Procedure(s) performed (including Critical Care):  Procedures   ____________________________________________   INITIAL IMPRESSION / MDM / ASSESSMENT AND PLAN / ED COURSE  As part of my medical decision making, I reviewed the following data within the electronic MEDICAL RECORD NUMBER Nursing notes reviewed and incorporated, Labs reviewed , Old chart reviewed, Radiograph reviewed , Discussed with admitting physician (Dr. Arville Care), and Notes from prior ED visits   Differential diagnosis includes, but is not limited to, cellulitis, stasis dermatitis, lymphedema.  Vital signs are stable other than hypertension.  I personally reviewed the patient's imaging and agree with the radiologist's interpretation that there is no evidence of acute abnormality on chest x-ray, and I suspect that her exertional  dyspnea is related mostly due to her body habitus and deconditioning.  Basic metabolic panel is normal, CBC notable for a leukocytosis of just over 16.  Patient has not been able to provide a urine specimen.  Blood cultures pending.  Comprehensive metabolic panel generally reassuring.  Given the appearance of the patient's legs, the rising leukocytosis, or worsening pain, worsening drainage, etc., I believe that she would benefit from inpatient treatment.  I ordered ceftriaxone 1 g IV and vancomycin per pharmacy consult and will consult the hospitalist.  The patient agrees with the plan.       Clinical Course as of 09/07/20 1610  [facility] Sep 07, 2020  0134 Discussed case by phone with Dr. Arville Care who will admit [CF]    Clinical Course User Index [CF] Loleta Rose, MD     ____________________________________________  FINAL CLINICAL IMPRESSION(S) / ED DIAGNOSES  Final diagnoses:  Cellulitis of both lower extremities  Chronic venous stasis dermatitis of both lower extremities  Chronic acquired lymphedema     MEDICATIONS GIVEN DURING THIS VISIT:  Medications  sacubitril-valsartan (ENTRESTO) 49-51 mg per tablet (1 tablet Oral Not Given 09/07/20 0420)  furosemide (LASIX) tablet 40 mg (has no administration in time range)  metoprolol succinate (TOPROL-XL) 24 hr tablet 25 mg (has no administration in time range)  insulin glargine (LANTUS) injection 20 Units (20 Units Subcutaneous Not Given 09/07/20 0424)  linagliptin (TRADJENTA) tablet 5 mg (has no administration in time range)  hydrALAZINE (APRESOLINE) tablet 50 mg (50 mg Oral Not Given 09/07/20 0629)  atorvastatin (LIPITOR) tablet 20 mg (has no administration in time range)  levETIRAcetam (KEPPRA) tablet 750 mg (has no administration in time range)  guaiFENesin-dextromethorphan (ROBITUSSIN DM) 100-10 MG/5ML syrup 10 mL (has no administration in time range)  dorzolamide-timolol (COSOPT) 22.3-6.8 MG/ML ophthalmic solution 1 drop (has no  administration in time range)  cefTRIAXone (ROCEPHIN) 2 g in sodium chloride 0.9 % 100 mL IVPB (has no administration in time range)  enoxaparin (LOVENOX) injection 67.5  mg (has no administration in time range)  acetaminophen (TYLENOL) tablet 650 mg (has no administration in time range)    Or  acetaminophen (TYLENOL) suppository 650 mg (has no administration in time range)  traZODone (DESYREL) tablet 25 mg (has no administration in time range)  ondansetron (ZOFRAN) tablet 4 mg (has no administration in time range)    Or  ondansetron (ZOFRAN) injection 4 mg (has no administration in time range)  magnesium hydroxide (MILK OF MAGNESIA) suspension 30 mL (has no administration in time range)  morphine 2 MG/ML injection 2 mg (has no administration in time range)  0.9 %  sodium chloride infusion ( Intravenous Infusion Verify 09/07/20 0611)  albuterol (PROVENTIL) (2.5 MG/3ML) 0.083% nebulizer solution 2.5 mg (has no administration in time range)  insulin aspart (novoLOG) injection 0-9 Units (has no administration in time range)  vancomycin (VANCOREADY) IVPB 1500 mg/300 mL (has no administration in time range)  acetaminophen (TYLENOL) tablet 650 mg (650 mg Oral Given 09/06/20 2049)  cefTRIAXone (ROCEPHIN) 1 g in sodium chloride 0.9 % 100 mL IVPB (0 g Intravenous Stopped 09/07/20 0203)  vancomycin (VANCOREADY) IVPB 1000 mg/200 mL (1,000 mg Intravenous New Bag/Given 09/07/20 0512)    Followed by  vancomycin (VANCOREADY) IVPB 1500 mg/300 mL (1,500 mg Intravenous New Bag/Given 09/07/20 9381)     ED Discharge Orders     None        Note:  This document was prepared using Dragon voice recognition software and may include unintentional dictation errors.   Loleta Rose, MD 09/07/20 [phone]

## 2020-09-08 LAB — MAGNESIUM: Magnesium: 1.7 mg/dL (ref 1.7–2.4)

## 2020-09-08 LAB — COMPREHENSIVE METABOLIC PANEL
ALT: 15 U/L (ref 0–44)
AST: 34 U/L (ref 15–41)
Albumin: 2.4 g/dL — ABNORMAL LOW (ref 3.5–5.0)
Alkaline Phosphatase: 41 U/L (ref 38–126)
Anion gap: 3 — ABNORMAL LOW (ref 5–15)
BUN: 9 mg/dL (ref 8–23)
CO2: 29 mmol/L (ref 22–32)
Calcium: 7.9 mg/dL — ABNORMAL LOW (ref 8.9–10.3)
Chloride: 109 mmol/L (ref 98–111)
Creatinine, Ser: 0.66 mg/dL (ref 0.44–1.00)
GFR, Estimated: >60 mL/min (ref >60)
Glucose, Bld: 104 mg/dL — ABNORMAL HIGH (ref 70–99)
Potassium: 3.5 mmol/L (ref 3.5–5.1)
Sodium: 141 mmol/L (ref 135–145)
Total Bilirubin: 0.6 mg/dL (ref 0.3–1.2)
Total Protein: 5.5 g/dL — ABNORMAL LOW (ref 6.5–8.1)

## 2020-09-08 LAB — CBC WITH DIFFERENTIAL/PLATELET
Abs Immature Granulocytes: 0.04 10*3/uL (ref 0.00–0.07)
Basophils Absolute: 0 10*3/uL (ref 0.0–0.1)
Basophils Relative: 0 %
Eosinophils Absolute: 0.1 10*3/uL (ref 0.0–0.5)
Eosinophils Relative: 1 %
HCT: 27.9 % — ABNORMAL LOW (ref 36.0–46.0)
Hemoglobin: 9.1 g/dL — ABNORMAL LOW (ref 12.0–15.0)
Immature Granulocytes: 0 %
Lymphocytes Relative: 24 %
Lymphs Abs: 2.1 10*3/uL (ref 0.7–4.0)
MCH: 26.6 pg (ref 26.0–34.0)
MCHC: 32.6 g/dL (ref 30.0–36.0)
MCV: 81.6 fL (ref 80.0–100.0)
Monocytes Absolute: 0.7 10*3/uL (ref 0.1–1.0)
Monocytes Relative: 8 %
Neutro Abs: 6 10*3/uL (ref 1.7–7.7)
Neutrophils Relative %: 67 %
Platelets: 244 10*3/uL (ref 150–400)
RBC: 3.42 MIL/uL — ABNORMAL LOW (ref 3.87–5.11)
RDW: 14.7 % (ref 11.5–15.5)
WBC: 9.1 10*3/uL (ref 4.0–10.5)
nRBC: 0 % (ref 0.0–0.2)

## 2020-09-08 LAB — GLUCOSE, CAPILLARY
Glucose-Capillary: 103 mg/dL — ABNORMAL HIGH (ref 70–99)
Glucose-Capillary: 118 mg/dL — ABNORMAL HIGH (ref 70–99)
Glucose-Capillary: 128 mg/dL — ABNORMAL HIGH (ref 70–99)
Glucose-Capillary: 192 mg/dL — ABNORMAL HIGH (ref 70–99)

## 2020-09-08 LAB — HEMOGLOBIN A1C
Hgb A1c MFr Bld: 6.8 % — ABNORMAL HIGH (ref 4.8–5.6)
Mean Plasma Glucose: 148 mg/dL

## 2020-09-08 LAB — PROCALCITONIN: Procalcitonin: 0.14 ng/mL

## 2020-09-08 LAB — PHOSPHORUS: Phosphorus: 2.7 mg/dL (ref 2.5–4.6)

## 2020-09-08 MED ORDER — SODIUM CHLORIDE 0.9 % IV SOLN
INTRAVENOUS | Status: DC | PRN
  Administered 2020-09-08 – 2020-09-09 (×2): 250 mL via INTRAVENOUS

## 2020-09-08 NOTE — Progress Notes (Signed)
Met with the patient to discuss Dc plan and needs She lives at home with a room mate, she has a wheelchair and a rolling walker at home She stated she has been to the wound care clinic but did not continue to go due to transportation I contacted [employer] and they confirmed that they have medical transportation that the patient can set up.  I educated the patient about the ability to use [facility] for transportation but did let her know she would need to set it up prior to the appointment, She stated understanding , [facility] would not allow me to set it up for her but stated that she can set it up with a phone call.  The patient agrees that she will call the [facility] to set up the transportation to be able to go to [facility]ound care clinic [place]

## 2020-09-08 NOTE — Progress Notes (Signed)
PROGRESS NOTE    Grace Short  IOE:[id number] DOB: 1946-01-10 DOA: 09/06/2020 PCP: Shane Crutch, PA  Chief Complaint  Patient presents with   Leg Swelling    Brief Narrative: Grace Short is Grace Short 75 y.o. female with medical history significant for morbid obesity, chronic lymphedema, HFpEF, COPD, CVA, type diabetes mellitus, hypertension and dyslipidemia, who presented to the ER with acute onset of bilateral lower extremity worsening swelling with erythema, warmth and tenderness with bruising.  She denies any fever or chills.  No chest pain or dyspnea or cough or wheezing hemoptysis.  No dysuria, oliguria or hematuria or flank pain.   ED Course: When she came to the ER blood pressure was 143/67 with otherwise normal vital signs.  Labs revealed unremarkable CMP.  CBC showed leukocytosis 16.2 with neutrophilia and anemia close to baseline.   Imaging: Chest x-ray showed no acute cardiopulmonary disease.  The patient was given 650 mg p.o. Tylenol and Resa Rinks gram of IV Rocephin.  She will be admitted to [facility] for further evaluation and management.  Assessment & Plan:   Active Problems:   Cellulitis of both lower extremities  1.  Bilateral Lower Extremity Cellulitis  Chronic Lymphedema - Will continue ceftriaxone/vanc x24 hrs, then transition to ancef - Pain management to be provided. - We will follow her leukocytosis. -- wound c/s appreciated - importance of following up with [facility] for lymphedema/wound care expressed (transportation she notes is an issue).  Appreciate wound care recs -> see below.  Wound care recs topical care of the patient's LEs using Eliaz Fout daily soap and water cleanse and rise.  Care is to be taken to gently wash between toes and in skin folds.  Currently there is caked powder in these areas which contributes to friction and bacterial overgrowth. Eucerin cream is to to applied to the hyperkeratotic areas and xeroform (antimicrobial,  nonadherent) is to be applied over any areas of continued weeping and topped with ABD pads.  Legs are to be wrapped with Kerlix from just below toes to just below knees (it will take 2 rolls per leg).  The Kerlix is to be topped with 6-inch ACE bandages (it will take 2 bandages per leg).  The feet are to be placed into Prevalon boots as floatation efforts are ineffective with pillows. Aritzel Krusemark sacral foam is to be placed for PI prevention.  Usman Millett bariatric mattress is provided to improve ability to turn and reposition in bed  2.  Hypertensive urgency. - BP improved, continue to monitor  -- continue entresto, metoprolol, lasix, hydralazine  3.  Dyslipidemia. - We will continue statin therapy.  4.  Type 2 diabetes mellitus. - We will place her on supplemental coverage with NovoLog and continue basal coverage. - Changed to Tradjenta  5.  HFpEF. - We will continue Lasix, Entresto and Toprol-XL.  6.  Seizure disorder. - We will continue Keppra.  7.  COPD. - We will continue her as needed albuterol.  DVT prophylaxis: lovenox Code Status: full  Family Communication: none at bedside Disposition:   Status is: Inpatient  Remains inpatient appropriate because:Inpatient level of care appropriate due to severity of illness  Dispo: The patient is from: Home              Anticipated d/c is to: Home              Patient currently is not medically stable to d/c.   Difficult to place patient No  Consultants:  none  Procedures:  none  Antimicrobials:  Anti-infectives (From admission, onward)    Start     Dose/Rate Route Frequency Ordered Stop   09/08/20 0600  vancomycin (VANCOREADY) IVPB 1500 mg/300 mL  Status:  Discontinued        1,500 mg 150 mL/hr over 120 Minutes Intravenous Every 24 hours 09/07/20 0517 09/07/20 1724   09/08/20 0600  ceFAZolin (ANCEF) IVPB 2g/100 mL premix        2 g 200 mL/hr over 30 Minutes Intravenous Every 8 hours 09/07/20 2058     09/07/20 1200  cefTRIAXone  (ROCEPHIN) 2 g in sodium chloride 0.9 % 100 mL IVPB  Status:  Discontinued        2 g 200 mL/hr over 30 Minutes Intravenous Every 24 hours 09/07/20 0136 09/07/20 1724   09/07/20 0615  vancomycin (VANCOREADY) IVPB 1500 mg/300 mL       See Hyperspace for full Linked Orders Report.   1,500 mg 150 mL/hr over 120 Minutes Intravenous  Once 09/07/20 0420 09/07/20 0828   09/07/20 0515  vancomycin (VANCOREADY) IVPB 1000 mg/200 mL       See Hyperspace for full Linked Orders Report.   1,000 mg 200 mL/hr over 60 Minutes Intravenous  Once 09/07/20 0420 09/07/20 0612   09/07/20 0230  vancomycin (VANCOREADY) IVPB 1500 mg/300 mL  Status:  Discontinued       See Hyperspace for full Linked Orders Report.   1,500 mg 150 mL/hr over 120 Minutes Intravenous  Once 09/07/20 0129 09/07/20 0420   09/07/20 0145  vancomycin (VANCOCIN) IVPB 1000 mg/200 mL premix  Status:  Discontinued        1,000 mg 200 mL/hr over 60 Minutes Intravenous  Once 09/07/20 0136 09/07/20 0157   09/07/20 0130  vancomycin (VANCOCIN) 2,500 mg in sodium chloride 0.9 % 500 mL IVPB  Status:  Discontinued        2,500 mg 250 mL/hr over 120 Minutes Intravenous  Once 09/07/20 0126 09/07/20 0127   09/07/20 0130  vancomycin (VANCOCIN) IVPB 1000 mg/200 mL premix  Status:  Discontinued       See Hyperspace for full Linked Orders Report.   1,000 mg 200 mL/hr over 60 Minutes Intravenous  Once 09/07/20 0128 09/07/20 0420   09/07/20 0115  cefTRIAXone (ROCEPHIN) 1 g in sodium chloride 0.9 % 100 mL IVPB        1 g 200 mL/hr over 30 Minutes Intravenous STAT 09/07/20 0114 09/07/20 0203          Subjective: Presented due to swelling in legs, redness, ozzing  Objective: Vitals:   09/07/20 2009 09/07/20 2359 09/08/20 0327 09/08/20 0740  BP: (!) 166/47 (!) 141/49 (!) 133/46 (!) 157/56  Pulse: 86 85 82 80  Resp: 18 15 18 18   Temp: 99.9 F (37.7 C) 99.8 F (37.7 C) 99.2 F (37.3 C) 98.9 F (37.2 C)  TempSrc:    Oral  SpO2: 97% 97% 96% 97%   Weight:      Height:        Intake/Output Summary (Last 24 hours) at 09/08/2020 0951 Last data filed at 09/08/2020 0300 Gross per 24 hour  Intake 907.25 ml  Output 850 ml  Net 57.25 ml   Filed Weights   09/06/20 1814 09/07/20 0400  Weight: 136.1 kg (!) 149.1 kg    Examination:  General: No acute distress. Obese. Cardiovascular: RRR Lungs: unlabored Abdomen: Soft, nontender, protuberant Neurological: Alert and oriented 3. Moves all extremities 4. Cranial  nerves II through XII grossly intact. Extremities: bilateral LE lymphedema, ace wraps to bilateral legs, prevalon boots     Data Reviewed: I have personally reviewed following labs and imaging studies  CBC: Recent Labs  Lab 09/06/20 1818 09/07/20 0535 09/08/20 0515  WBC 16.2* 11.6* 9.1  NEUTROABS 14.0*  --  6.0  HGB 10.8* 10.2* 9.1*  HCT 34.9* 32.1* 27.9*  MCV 84.1 81.9 81.6  PLT 284 267 244    Basic Metabolic Panel: Recent Labs  Lab 09/06/20 1818 09/07/20 0535 09/08/20 0515  NA 140 140 141  K 3.9 4.0 3.5  CL 105 107 109  CO2 25 25 29   GLUCOSE 129* 152* 104*  BUN 15 13 9   CREATININE 0.85 0.79 0.66  CALCIUM 8.9 8.4* 7.9*  MG  --   --  1.7  PHOS  --   --  2.7    GFR: Estimated Creatinine Clearance: 86 mL/min (by C-G formula based on SCr of 0.66 mg/dL).  Liver Function Tests: Recent Labs  Lab 09/06/20 1818 09/08/20 0515  AST 26 34  ALT 11 15  ALKPHOS 53 41  BILITOT 0.9 0.6  PROT 7.5 5.5*  ALBUMIN 3.3* 2.4*    CBG: Recent Labs  Lab 09/07/20 0743 09/07/20 1132 09/07/20 1657 09/07/20 2122 09/08/20 0741  GLUCAP 145* 128* 149* 157* 103*     Recent Results (from the past 240 hour(s))  Blood Culture (routine x 2)     Status: None (Preliminary result)   Collection Time: 09/06/20  6:18 PM   Specimen: BLOOD  Result Value Ref Range Status   Specimen Description BLOOD RIGHT ANTECUBITAL  Final   Special Requests   Final    BOTTLES DRAWN AEROBIC AND ANAEROBIC Blood Culture adequate volume    Culture   Final    NO GROWTH 1 DAY Performed at [facility], [address], [place], [address] [place]    Report Status PENDING  Incomplete  Blood Culture (routine x 2)     Status: None (Preliminary result)   Collection Time: 09/06/20  6:18 PM   Specimen: BLOOD  Result Value Ref Range Status   Specimen Description BLOOD BLOOD LEFT WRIST  Final   Special Requests   Final    BOTTLES DRAWN AEROBIC AND ANAEROBIC Blood Culture adequate volume   Culture   Final    NO GROWTH 1 DAY Performed at [facility], [address] [place], [address] [place]    Report Status PENDING  Incomplete  Resp Panel by RT-PCR (Flu Dyneisha Murchison&B, Covid) Nasopharyngeal Swab     Status: None   Collection Time: 09/07/20  1:37 AM   Specimen: Nasopharyngeal Swab; Nasopharyngeal(NP) swabs in vial transport medium  Result Value Ref Range Status   SARS Coronavirus 2 by RT PCR NEGATIVE NEGATIVE Final    Comment: (NOTE) SARS-CoV-2 target nucleic acids are NOT DETECTED.  The SARS-CoV-2 RNA is generally detectable in upper respiratory specimens during the acute phase of infection. The lowest concentration of SARS-CoV-2 viral copies this assay can detect is 138 copies/mL. Marriah Sanderlin negative result does not preclude SARS-Cov-2 infection and should not be used as the sole basis for treatment or other patient management decisions. Jakaiden Fill negative result may occur with  improper specimen collection/handling, submission of specimen other than nasopharyngeal swab, presence of viral mutation(s) within the areas targeted by this assay, and inadequate number of viral copies(<138 copies/mL). Deondria Puryear negative result must be combined with clinical observations, patient history, and epidemiological information. The expected result is  Negative.  Fact Sheet for Patients:  BloggerCourse.com  Fact Sheet for Healthcare Providers:  SeriousBroker.it  This test is no t yet approved or  cleared by the [place] FDA and  has been authorized for detection and/or diagnosis of SARS-CoV-2 by FDA under an Emergency Use Authorization (EUA). This EUA will remain  in effect (meaning this test can be used) for the duration of the COVID-19 declaration under Section 564(b)(1) of the Act, 21 U.S.C.section 360bbb-3(b)(1), unless the authorization is terminated  or revoked sooner.       Influenza Pranathi Winfree by PCR NEGATIVE NEGATIVE Final   Influenza B by PCR NEGATIVE NEGATIVE Final    Comment: (NOTE) The Xpert Xpress SARS-CoV-2/FLU/RSV plus assay is intended as an aid in the diagnosis of influenza from Nasopharyngeal swab specimens and should not be used as Particia Strahm sole basis for treatment. Nasal washings and aspirates are unacceptable for Xpert Xpress SARS-CoV-2/FLU/RSV testing.  Fact Sheet for Patients: BloggerCourse.com  Fact Sheet for Healthcare Providers: SeriousBroker.it  This test is not yet approved or cleared by the [place] FDA and has been authorized for detection and/or diagnosis of SARS-CoV-2 by FDA under an Emergency Use Authorization (EUA). This EUA will remain in effect (meaning this test can be used) for the duration of the COVID-19 declaration under Section 564(b)(1) of the Act, 21 U.S.C. section 360bbb-3(b)(1), unless the authorization is terminated or revoked.  Performed at [facility], [address] [place], [place] [address]   Urine culture     Status: None (Preliminary result)   Collection Time: 09/07/20  6:38 AM   Specimen: In/Out Cath Urine  Result Value Ref Range Status   Specimen Description   Final    IN/OUT CATH URINE Performed at [facility], [address] [place], [place] [address]    Special Requests   Final    NONE Performed at [facility], [address], [place], [place] [address]    Culture   Final    CULTURE REINCUBATED FOR BETTER GROWTH Performed  at [facility], [address], [place], [place] [address]    Report Status PENDING  Incomplete         Radiology Studies: DG Chest Port 1 View  Result Date: 09/07/2020 CLINICAL DATA:  Severe bilateral lower extremity swelling. EXAM: PORTABLE CHEST 1 VIEW COMPARISON:  12/10/2019 FINDINGS: The heart size and mediastinal contours are within normal limits. Both lungs are clear. The visualized skeletal structures are unremarkable. IMPRESSION: No active disease. Electronically Signed   By: Deatra [place] M.D.   On: 09/07/2020 01:41        Scheduled Meds:  atorvastatin  20 mg Oral Daily   dorzolamide-timolol  1 drop Both Eyes BID   enoxaparin (LOVENOX) injection  0.5 mg/kg Subcutaneous Q24H   furosemide  40 mg Oral Daily   hydrALAZINE  50 mg Oral Q8H   hydrocerin   Topical Daily   insulin aspart  0-9 Units Subcutaneous TID AC & HS   insulin glargine  20 Units Subcutaneous BID   levETIRAcetam  750 mg Oral BID   linagliptin  5 mg Oral Daily   metoprolol succinate  25 mg Oral Daily   sacubitril-valsartan  1 tablet Oral BID   Continuous Infusions:  sodium chloride 75 mL/hr at 09/07/20 0611    ceFAZolin (ANCEF) IV 2 g (09/08/20 0630)     LOS: 1 day    Time spent: over 30 min    Lacretia Nicks, MD [facility]   To contact the attending provider between 7A-7P or the covering provider during after hours 7P-7A, please log into the web site [email or web address] and access using universal [place] password for that web site. If you do not have the password, please call the hospital operator.  09/08/2020, 9:51 AM

## 2020-09-08 NOTE — Progress Notes (Signed)
PT Cancellation Note  Patient Details Name: Grace Short. Pankonin MRN: [id number] DOB: Jun 24, 1945   Cancelled Treatment:    Reason Eval/Treat Not Completed: Patient at procedure or test/unavailable (Consult received and chart reviewed.  Patient with IV team at bedside, working towards IV placement.  Will re-attempt next date as medically appropriate and available.)   Winifred Balogh H. Manson Passey, PT, DPT, NCS 09/08/20, 5:37 PM [phone]

## 2020-09-09 DIAGNOSIS — R531 Weakness: Secondary | ICD-10-CM

## 2020-09-09 DIAGNOSIS — R569 Unspecified convulsions: Secondary | ICD-10-CM

## 2020-09-09 DIAGNOSIS — I160 Hypertensive urgency: Secondary | ICD-10-CM

## 2020-09-09 DIAGNOSIS — I5032 Chronic diastolic (congestive) heart failure: Secondary | ICD-10-CM

## 2020-09-09 DIAGNOSIS — E1169 Type 2 diabetes mellitus with other specified complication: Secondary | ICD-10-CM

## 2020-09-09 LAB — CBC WITH DIFFERENTIAL/PLATELET
Abs Immature Granulocytes: 0.04 10*3/uL (ref 0.00–0.07)
Basophils Absolute: 0 10*3/uL (ref 0.0–0.1)
Basophils Relative: 0 %
Eosinophils Absolute: 0.1 10*3/uL (ref 0.0–0.5)
Eosinophils Relative: 1 %
HCT: 33 % — ABNORMAL LOW (ref 36.0–46.0)
Hemoglobin: 10.4 g/dL — ABNORMAL LOW (ref 12.0–15.0)
Immature Granulocytes: 0 %
Lymphocytes Relative: 13 %
Lymphs Abs: 1.6 10*3/uL (ref 0.7–4.0)
MCH: 26.1 pg (ref 26.0–34.0)
MCHC: 31.5 g/dL (ref 30.0–36.0)
MCV: 82.7 fL (ref 80.0–100.0)
Monocytes Absolute: 0.9 10*3/uL (ref 0.1–1.0)
Monocytes Relative: 7 %
Neutro Abs: 9.8 10*3/uL — ABNORMAL HIGH (ref 1.7–7.7)
Neutrophils Relative %: 79 %
Platelets: 255 10*3/uL (ref 150–400)
RBC: 3.99 MIL/uL (ref 3.87–5.11)
RDW: 14.5 % (ref 11.5–15.5)
WBC: 12.3 10*3/uL — ABNORMAL HIGH (ref 4.0–10.5)
nRBC: 0 % (ref 0.0–0.2)

## 2020-09-09 LAB — PROCALCITONIN: Procalcitonin: <0.1 ng/mL

## 2020-09-09 LAB — URINE CULTURE: Culture: 20000 — AB

## 2020-09-09 LAB — COMPREHENSIVE METABOLIC PANEL
ALT: 12 U/L (ref 0–44)
AST: 24 U/L (ref 15–41)
Albumin: 2.6 g/dL — ABNORMAL LOW (ref 3.5–5.0)
Alkaline Phosphatase: 45 U/L (ref 38–126)
Anion gap: 7 (ref 5–15)
BUN: 7 mg/dL — ABNORMAL LOW (ref 8–23)
CO2: 30 mmol/L (ref 22–32)
Calcium: 8.1 mg/dL — ABNORMAL LOW (ref 8.9–10.3)
Chloride: 102 mmol/L (ref 98–111)
Creatinine, Ser: 0.61 mg/dL (ref 0.44–1.00)
GFR, Estimated: >60 mL/min (ref >60)
Glucose, Bld: 163 mg/dL — ABNORMAL HIGH (ref 70–99)
Potassium: 3.7 mmol/L (ref 3.5–5.1)
Sodium: 139 mmol/L (ref 135–145)
Total Bilirubin: 0.6 mg/dL (ref 0.3–1.2)
Total Protein: 6.1 g/dL — ABNORMAL LOW (ref 6.5–8.1)

## 2020-09-09 LAB — GLUCOSE, CAPILLARY
Glucose-Capillary: 157 mg/dL — ABNORMAL HIGH (ref 70–99)
Glucose-Capillary: 129 mg/dL — ABNORMAL HIGH (ref 70–99)
Glucose-Capillary: 112 mg/dL — ABNORMAL HIGH (ref 70–99)
Glucose-Capillary: 107 mg/dL — ABNORMAL HIGH (ref 70–99)

## 2020-09-09 LAB — PHOSPHORUS: Phosphorus: 2.7 mg/dL (ref 2.5–4.6)

## 2020-09-09 LAB — MAGNESIUM: Magnesium: 1.7 mg/dL (ref 1.7–2.4)

## 2020-09-09 MED ORDER — METOPROLOL SUCCINATE ER 50 MG PO TB24
50.0000 mg | ORAL_TABLET | Freq: Every day | ORAL | Status: DC
  Administered 2020-09-09 – 2020-09-10 (×2): 50 mg via ORAL
  Filled 2020-09-09 (×2): qty 1

## 2020-09-09 NOTE — Progress Notes (Signed)
PT Cancellation Note  Patient Details Name: Grace Short. Kemp MRN: [id number] DOB: 1945/12/09   Cancelled Treatment:    Reason Eval/Treat Not Completed: Patient declined, no reason specified. Patient refused any PT at this time states she is in pain. Reports she recently received pain medication. Will re-attempt PT this pm.    Liliani Bobo 09/09/2020, 10:23 AM

## 2020-09-09 NOTE — Progress Notes (Signed)
Patient ID: Grace Short Short, female   DOB: Jul 17, 1945, 75 y.o.   MRN: [id number] [facility] PROGRESS NOTE  Grace Short QMV:[id number] DOB: Jul 17, 1945 DOA: 09/06/2020 PCP: Shane CrutchMarkley, Linda, PA  HPI/Subjective:   Objective: Vitals:   09/09/20 1401 09/09/20 1451  BP: (!) 154/52 (!) 157/69  Pulse: 75 83  Resp: 18   Temp: 99.1 F (37.3 C)   SpO2: 95% 96%    Intake/Output Summary (Last 24 hours) at 09/09/2020 1754 Last data filed at 09/09/2020 1540 Gross per 24 hour  Intake 890.41 ml  Output 4800 ml  Net -3909.59 ml   Filed Weights   09/06/20 1814 09/07/20 0400  Weight: 136.1 kg (!) 149.1 kg    ROS: Review of Systems  Respiratory:  Negative for cough and shortness of breath.   Cardiovascular:  Negative for chest pain.  Gastrointestinal:  Negative for abdominal pain, nausea and vomiting.  Exam: Physical Exam HENT:     Head: Normocephalic.     Mouth/Throat:     Pharynx: No oropharyngeal exudate.  Eyes:     General: Lids are normal.     Conjunctiva/sclera: Conjunctivae normal.     Pupils: Pupils are equal, round, and reactive to light.  Cardiovascular:     Rate and Rhythm: Normal rate and regular rhythm.     Heart sounds: Normal heart sounds, S1 normal and S2 normal.  Pulmonary:     Breath sounds: Examination of the right-lower field reveals decreased breath sounds. Examination of the left-lower field reveals decreased breath sounds. Decreased breath sounds present. No wheezing, rhonchi or rales.  Abdominal:     Palpations: Abdomen is soft.     Tenderness: There is no abdominal tenderness.  Musculoskeletal:     Right upper leg: Swelling present.     Left upper leg: Swelling present.     Right lower leg: Swelling present.     Left lower leg: Swelling present.     Right ankle: Swelling present.     Left ankle: Swelling present.  Skin:    General: Skin is warm.     Comments: Lower legs covered with Unna boot.  Right mid calf small ulceration with some drainage.   Patient has chronic lower extremity discoloration.  Neurological:     Mental Status: She is alert and oriented to person, place, and time.     Data Reviewed: Basic Metabolic Panel: Recent Labs  Lab 09/06/20 1818 09/07/20 0535 09/08/20 0515 09/09/20 0422  NA 140 140 141 139  K 3.9 4.0 3.5 3.7  CL 105 107 109 102  CO2 25 25 29 30   GLUCOSE 129* 152* 104* 163*  BUN 15 13 9  7*  CREATININE 0.85 0.79 0.66 0.61  CALCIUM 8.9 8.4* 7.9* 8.1*  MG  --   --  1.7 1.7  PHOS  --   --  2.7 2.7   Liver Function Tests: Recent Labs  Lab 09/06/20 1818 09/08/20 0515 09/09/20 0422  AST 26 34 24  ALT 11 15 12   ALKPHOS 53 41 45  BILITOT 0.9 0.6 0.6  PROT 7.5 5.5* 6.1*  ALBUMIN 3.3* 2.4* 2.6*   CBC: Recent Labs  Lab 09/06/20 1818 09/07/20 0535 09/08/20 0515 09/09/20 0422  WBC 16.2* 11.6* 9.1 12.3*  NEUTROABS 14.0*  --  6.0 9.8*  HGB 10.8* 10.2* 9.1* 10.4*  HCT 34.9* 32.1* 27.9* 33.0*  MCV 84.1 81.9 81.6 82.7  PLT 284 267 244 255    BNP (last 3 results) Recent Labs  12/07/19 1337 12/11/19 0535  BNP 152.7* 35.6    CBG: Recent Labs  Lab 09/08/20 1814 09/08/20 2201 09/09/20 0810 09/09/20 1140 09/09/20 1628  GLUCAP 128* 192* 157* 129* 112*    Recent Results (from the past 240 hour(s))  Blood Culture (routine x 2)     Status: None (Preliminary result)   Collection Time: 09/06/20  6:18 PM   Specimen: BLOOD  Result Value Ref Range Status   Specimen Description BLOOD RIGHT ANTECUBITAL  Final   Special Requests   Final    BOTTLES DRAWN AEROBIC AND ANAEROBIC Blood Culture adequate volume   Culture   Final    NO GROWTH 2 DAYS Performed at [facility], [address] [place], [place] [address]    Report Status PENDING  Incomplete  Blood Culture (routine x 2)     Status: None (Preliminary result)   Collection Time: 09/06/20  6:18 PM   Specimen: BLOOD  Result Value Ref Range Status   Specimen Description BLOOD BLOOD LEFT WRIST  Final   Special Requests    Final    BOTTLES DRAWN AEROBIC AND ANAEROBIC Blood Culture adequate volume   Culture   Final    NO GROWTH 2 DAYS Performed at [facility], [address], [place], [place] [address]    Report Status PENDING  Incomplete  Resp Panel by RT-PCR (Flu A&B, Covid) Nasopharyngeal Swab     Status: None   Collection Time: 09/07/20  1:37 AM   Specimen: Nasopharyngeal Swab; Nasopharyngeal(NP) swabs in vial transport medium  Result Value Ref Range Status   SARS Coronavirus 2 by RT PCR NEGATIVE NEGATIVE Final    Comment: (NOTE) SARS-CoV-2 target nucleic acids are NOT DETECTED.  The SARS-CoV-2 RNA is generally detectable in upper respiratory specimens during the acute phase of infection. The lowest concentration of SARS-CoV-2 viral copies this assay can detect is 138 copies/mL. A negative result does not preclude SARS-Cov-2 infection and should not be used as the sole basis for treatment or other patient management decisions. A negative result may occur with  improper specimen collection/handling, submission of specimen other than nasopharyngeal swab, presence of viral mutation(s) within the areas targeted by this assay, and inadequate number of viral copies(<138 copies/mL). A negative result must be combined with clinical observations, patient history, and epidemiological information. The expected result is Negative.  Fact Sheet for Patients:  BloggerCourse.com  Fact Sheet for Healthcare Providers:  SeriousBroker.it  This test is no t yet approved or cleared by the [place] FDA and  has been authorized for detection and/or diagnosis of SARS-CoV-2 by FDA under an Emergency Use Authorization (EUA). This EUA will remain  in effect (meaning this test can be used) for the duration of the COVID-19 declaration under Section 564(b)(1) of the Act, 21 U.S.C.section 360bbb-3(b)(1), unless the authorization is terminated  or revoked  sooner.       Influenza A by PCR NEGATIVE NEGATIVE Final   Influenza B by PCR NEGATIVE NEGATIVE Final    Comment: (NOTE) The Xpert Xpress SARS-CoV-2/FLU/RSV plus assay is intended as an aid in the diagnosis of influenza from Nasopharyngeal swab specimens and should not be used as a sole basis for treatment. Nasal washings and aspirates are unacceptable for Xpert Xpress SARS-CoV-2/FLU/RSV testing.  Fact Sheet for Patients: BloggerCourse.com  Fact Sheet for Healthcare Providers: SeriousBroker.it  This test is not yet approved or cleared by the [place] FDA and has been authorized for detection and/or diagnosis of SARS-CoV-2 by FDA  under an Emergency Use Authorization (EUA). This EUA will remain in effect (meaning this test can be used) for the duration of the COVID-19 declaration under Section 564(b)(1) of the Act, 21 U.S.C. section 360bbb-3(b)(1), unless the authorization is terminated or revoked.  Performed at [facility], [address], [place], [place] [address]   Urine culture     Status: Abnormal   Collection Time: 09/07/20  6:38 AM   Specimen: In/Out Cath Urine  Result Value Ref Range Status   Specimen Description   Final    IN/OUT CATH URINE Performed at [facility], [address], [place], [place] [address]    Special Requests   Final    NONE Performed at [facility], [address] [place], [place] [address]    Culture (A)  Final    20,000 COLONIES/mL STAPHYLOCOCCUS AUREUS 80,000 COLONIES/mL DIPHTHEROIDS(CORYNEBACTERIUM SPECIES) Standardized susceptibility testing for this organism is not available. Performed at [facility], [address], [place], [place] [address]    Report Status 09/09/2020 FINAL  Final   Organism ID, Bacteria STAPHYLOCOCCUS AUREUS (A)  Final      Susceptibility   Staphylococcus aureus - MIC*    CIPROFLOXACIN <=0.5 SENSITIVE Sensitive      GENTAMICIN <=0.5 SENSITIVE Sensitive     NITROFURANTOIN 32 SENSITIVE Sensitive     OXACILLIN 0.5 SENSITIVE Sensitive     TETRACYCLINE <=1 SENSITIVE Sensitive     VANCOMYCIN 1 SENSITIVE Sensitive     TRIMETH/SULFA <=10 SENSITIVE Sensitive     CLINDAMYCIN <=0.25 SENSITIVE Sensitive     RIFAMPIN <=0.5 SENSITIVE Sensitive     Inducible Clindamycin NEGATIVE Sensitive     * 20,000 COLONIES/mL STAPHYLOCOCCUS AUREUS      Scheduled Meds:  atorvastatin  20 mg Oral Daily   dorzolamide-timolol  1 drop Both Eyes BID   enoxaparin (LOVENOX) injection  0.5 mg/kg Subcutaneous Q24H   furosemide  40 mg Oral Daily   hydrALAZINE  50 mg Oral Q8H   hydrocerin   Topical Daily   insulin aspart  0-9 Units Subcutaneous TID AC & HS   insulin glargine  20 Units Subcutaneous BID   levETIRAcetam  750 mg Oral BID   linagliptin  5 mg Oral Daily   metoprolol succinate  50 mg Oral Daily   sacubitril-valsartan  1 tablet Oral BID   Continuous Infusions:  sodium chloride 250 mL (09/09/20 0531)    ceFAZolin (ANCEF) IV 2 g (09/09/20 1500)    Assessment/Plan:  Bilateral lower extremity cellulitis and lymphedema.  Patient initially was started on Rocephin and vancomycin and now on Ancef alone.  Bilateral wound care.  Will need outpatient wound care Hypertensive urgency.  Blood pressure still little elevated.  Continue Entresto, metoprolol Lasix and hydralazine. Type 2 diabetes mellitus with hyperlipidemia on linagliptin glargine insulin twice a day and aspart insulin. Chronic diastolic congestive heart failure.  Continue Entresto, Toprol and Lasix Seizure disorder on Keppra Weakness.  Physical therapy now recommending rehab.      Code Status:     Code Status Orders  (From admission, onward)           Start     Ordered   09/07/20 0125  Full code  Continuous        09/07/20 0136           Code Status History     Date Active Date Inactive Code Status Order ID Comments User Context   12/07/2019  1742 12/15/2019 0254  Full Code [id number]  Jacques Navy, MD ED      Family Communication: Left message for roommate Disposition Plan: Status is: Inpatient   Dispo: The patient is from: Home              Anticipated d/c is to: Rehab              Patient currently on IV antibiotics for cellulitis   Difficult to place patient.  No.  Antibiotics: Ancef  Time spent: 28 minutes  Grace Short

## 2020-09-09 NOTE — Evaluation (Signed)
Physical Therapy Evaluation Patient Details Name: Grace Short. Grace Short MRN: [id number] DOB: 1945-07-27 Today's Date: 09/09/2020   History of Present Illness  Grace Short is a 75 y.o. female with medical history significant for morbid obesity, chronic lymphedema, HFpEF, COPD, CVA, type diabetes mellitus, hypertension and dyslipidemia, who presented to the ER with acute onset of bilateral lower extremity worsening swelling with erythema, warmth and tenderness with bruising.  She denies any fever or chills.  No chest pain or dyspnea or cough or wheezing hemoptysis.  No dysuria, oliguria or hematuria or flank pain.   Clinical Impression  Patient received in bed with NT present in room. Patient is slightly agitated, demanding during session. Provides minimal assistance with bed mobility. Requires +2-3 max to total assist for rolling, supine>< sit. Patient is able to stand from high size wise bed with mod +2 assist for safety and took a few steps with BRW to bsc and back to bed. Patient has very labored gait taking a few steps with minimal ability to lift feet. Patient will benefit from continued skilled PT while here to improve strength and functional independence.     Follow Up Recommendations SNF    Equipment Recommendations  Other (comment) (TBD)    Recommendations for Other Services       Precautions / Restrictions Precautions Precautions: Fall Restrictions Weight Bearing Restrictions: No      Mobility  Bed Mobility Overal bed mobility: Needs Assistance Bed Mobility: Rolling;Supine to Sit;Sit to Supine Rolling: Total assist;+2 for physical assistance   Supine to sit: Total assist;+2 for physical assistance +2-3 Sit to supine: Total assist;+2 for physical assistance +2-3   General bed mobility comments: requires extensive assist to move in bed    Transfers Overall transfer level: Needs assistance Equipment used: Rolling walker (2 wheeled) Transfers: Sit to/from Stand Sit to Stand:  +2 physical assistance;Max assist            Ambulation/Gait Ambulation/Gait assistance: Mod assist;+2 physical assistance Gait Distance (Feet): 5 Feet Assistive device: Rolling walker (2 wheeled) Gait Pattern/deviations: Step-to pattern;Decreased step length - right;Decreased step length - left;Shuffle;Trunk flexed;Wide base of support Gait velocity: decr   General Gait Details: patient able to take a few steps from bed to bsc then back to bed with +2 mod assist, cues for safety  Stairs            Wheelchair Mobility    Modified Rankin (Stroke Patients Only)       Balance Overall balance assessment: Needs assistance Sitting-balance support: Feet supported Sitting balance-Leahy Scale: Fair Sitting balance - Comments: fair balance while on  bsc   Standing balance support: Bilateral upper extremity supported;During functional activity Standing balance-Leahy Scale: Poor Standing balance comment: requires heavy B UE support and +2 assist for safety                             Pertinent Vitals/Pain Pain Assessment: Faces Faces Pain Scale: Hurts little more Pain Location: B LEs Pain Descriptors / Indicators: Sore;Discomfort Pain Intervention(s): Monitored during session;Repositioned    Home Living Family/patient expects to be discharged to:: Skilled nursing facility Living Arrangements: Non-relatives/Friends Available Help at Discharge: Friend(s) Type of Home: House         Home Equipment: Dan Humphreys - 2 wheels      Prior Function Level of Independence: Needs assistance         Comments: patient reports she sleeps in a recliner chair and  needs assistance for transfers - from prior admission     Hand Dominance        Extremity/Trunk Assessment   Upper Extremity Assessment Upper Extremity Assessment: Defer to OT evaluation    Lower Extremity Assessment Lower Extremity Assessment: Generalized weakness;RLE deficits/detail;LLE  deficits/detail RLE Deficits / Details: LE wrapped in ace bandage, bunny boots while in bed LLE Deficits / Details: LE wrapped with ace bandage, bunny boots on       Communication   Communication: No difficulties  Cognition Arousal/Alertness: Awake/alert Behavior During Therapy: WFL for tasks assessed/performed;Agitated Overall Cognitive Status: No family/caregiver present to determine baseline cognitive functioning                                 General Comments: patient is slightly agitated throughout session. Seems irritated at my asking questions      General Comments      Exercises     Assessment/Plan    PT Assessment Patient needs continued PT services  PT Problem List Decreased strength;Decreased mobility;Decreased activity tolerance;Decreased range of motion;Decreased balance;Pain;Obesity       PT Treatment Interventions DME instruction;Therapeutic exercise;Gait training;Functional mobility training;Therapeutic activities;Patient/family education    PT Goals (Current goals can be found in the Care Plan section)  Acute Rehab PT Goals Patient Stated Goal: to go home PT Goal Formulation: With patient Time For Goal Achievement: 09/23/20 Potential to Achieve Goals: Poor    Frequency Min 2X/week   Barriers to discharge Decreased caregiver support      Co-evaluation               AM-PAC PT "6 Clicks" Mobility  Outcome Measure Help needed turning from your back to your side while in a flat bed without using bedrails?: Total Help needed moving from lying on your back to sitting on the side of a flat bed without using bedrails?: Total Help needed moving to and from a bed to a chair (including a wheelchair)?: A Lot Help needed standing up from a chair using your arms (e.g., wheelchair or bedside chair)?: A Lot Help needed to walk in hospital room?: Total Help needed climbing 3-5 steps with a railing? : Total 6 Click Score: 8    End of Session    Activity Tolerance: Patient limited by fatigue;Patient limited by pain Patient left: in bed;with call bell/phone within reach;with nursing/sitter in room Nurse Communication: Mobility status PT Visit Diagnosis: Muscle weakness (generalized) (M62.81);Other abnormalities of gait and mobility (R26.89);Difficulty in walking, not elsewhere classified (R26.2);Pain Pain - Right/Left:  (bilateral) Pain - part of body: Ankle and joints of foot;Leg    Time: 1400-1430 PT Time Calculation (min) (ACUTE ONLY): 30 min   Charges:   PT Evaluation $PT Eval Moderate Complexity: 1 Mod PT Treatments $Therapeutic Activity: 8-22 mins        Karlei Waldo, PT, GCS 09/09/20,2:44 PM

## 2020-09-10 DIAGNOSIS — L03115 Cellulitis of right lower limb: Principal | ICD-10-CM

## 2020-09-10 DIAGNOSIS — I890 Lymphedema, not elsewhere classified: Secondary | ICD-10-CM

## 2020-09-10 DIAGNOSIS — L03116 Cellulitis of left lower limb: Secondary | ICD-10-CM

## 2020-09-10 LAB — CBC WITH DIFFERENTIAL/PLATELET
Abs Immature Granulocytes: 0.05 10*3/uL (ref 0.00–0.07)
Basophils Absolute: 0 10*3/uL (ref 0.0–0.1)
Basophils Relative: 0 %
Eosinophils Absolute: 0.1 10*3/uL (ref 0.0–0.5)
Eosinophils Relative: 1 %
HCT: 36 % (ref 36.0–46.0)
Hemoglobin: 11.4 g/dL — ABNORMAL LOW (ref 12.0–15.0)
Immature Granulocytes: 0 %
Lymphocytes Relative: 13 %
Lymphs Abs: 1.8 10*3/uL (ref 0.7–4.0)
MCH: 26.1 pg (ref 26.0–34.0)
MCHC: 31.7 g/dL (ref 30.0–36.0)
MCV: 82.6 fL (ref 80.0–100.0)
Monocytes Absolute: 1.2 10*3/uL — ABNORMAL HIGH (ref 0.1–1.0)
Monocytes Relative: 9 %
Neutro Abs: 10.5 10*3/uL — ABNORMAL HIGH (ref 1.7–7.7)
Neutrophils Relative %: 77 %
Platelets: 239 10*3/uL (ref 150–400)
RBC: 4.36 MIL/uL (ref 3.87–5.11)
RDW: 14.5 % (ref 11.5–15.5)
WBC: 13.6 10*3/uL — ABNORMAL HIGH (ref 4.0–10.5)
nRBC: 0 % (ref 0.0–0.2)

## 2020-09-10 LAB — GLUCOSE, CAPILLARY
Glucose-Capillary: 96 mg/dL (ref 70–99)
Glucose-Capillary: 179 mg/dL — ABNORMAL HIGH (ref 70–99)
Glucose-Capillary: 179 mg/dL — ABNORMAL HIGH (ref 70–99)
Glucose-Capillary: 172 mg/dL — ABNORMAL HIGH (ref 70–99)

## 2020-09-10 MED ORDER — CARVEDILOL 25 MG PO TABS
25.0000 mg | ORAL_TABLET | Freq: Two times a day (BID) | ORAL | Status: DC
  Administered 2020-09-10 – 2020-09-11 (×2): 25 mg via ORAL
  Filled 2020-09-10 (×2): qty 1

## 2020-09-10 MED ORDER — METOPROLOL SUCCINATE ER 50 MG PO TB24: 100.0000 mg | ORAL_TABLET | Freq: Every day | ORAL | Status: DC

## 2020-09-10 MED ORDER — LABETALOL HCL 5 MG/ML IV SOLN: 10.0000 mg | INTRAVENOUS | Status: DC | PRN

## 2020-09-10 MED ORDER — SACUBITRIL-VALSARTAN 97-103 MG PO TABS
1.0000 | ORAL_TABLET | Freq: Two times a day (BID) | ORAL | Status: DC
  Administered 2020-09-10 – 2020-09-15 (×10): 1 via ORAL
  Filled 2020-09-10 (×11): qty 1

## 2020-09-10 MED ORDER — METRONIDAZOLE 500 MG/100ML IV SOLN
500.0000 mg | Freq: Two times a day (BID) | INTRAVENOUS | Status: DC
  Administered 2020-09-10 – 2020-09-12 (×4): 500 mg via INTRAVENOUS
  Filled 2020-09-10 (×5): qty 100

## 2020-09-10 MED ORDER — HYDRALAZINE HCL 50 MG PO TABS
100.0000 mg | ORAL_TABLET | Freq: Three times a day (TID) | ORAL | Status: DC
  Administered 2020-09-10 – 2020-09-11 (×4): 100 mg via ORAL
  Filled 2020-09-10 (×4): qty 2

## 2020-09-10 NOTE — NC FL2 (Signed)
[place] MEDICAID FL2 LEVEL OF CARE SCREENING TOOL     IDENTIFICATION  Patient Name: Grace Short. Tietje Birthdate: 1945-04-03 Sex: female Admission Date (Current Location): 09/06/2020  [place] and [place] Number:  Chiropodist and Address:  [facility], [address] [place], [place] [address]      Provider Number: [id number]  Attending Physician Name and Address:  Alford Highland, MD  Relative Name and Phone Number:  Threasa Beards room mate POA [phone]    Current Level of Care: Hospital Recommended Level of Care: Skilled Nursing Facility Prior Approval Number:    Date Approved/Denied:   PASRR Number: [id number]  Discharge Plan: SNF    Current Diagnoses: Patient Active Problem List   Diagnosis Date Noted   Hypertensive urgency    Cellulitis of both lower extremities 09/07/2020   Hematuria    Lymphedema    Type 2 diabetes mellitus with hyperlipidemia (HCC)    Morbid obesity with BMI of 50.0-59.9, adult (HCC)    Weakness    Chronic diastolic CHF (congestive heart failure) (HCC) 12/07/2019   Edema of both lower extremities due to peripheral venous insufficiency 12/07/2019   Stasis edema with ulcer and inflammation, bilateral (HCC) 12/07/2019   SSS (sick sinus syndrome) (HCC) 12/07/2019   Pneumonia due to COVID-19 virus 12/07/2019   HTN (hypertension) 12/07/2019   COPD (chronic obstructive pulmonary disease) (HCC) 12/07/2019   Acute respiratory failure with hypoxemia (HCC) 12/07/2019   Glaucoma 12/20/2017   Pacemaker 12/20/2017   Personal history of transient ischemic attack (TIA), and cerebral infarction without residual deficits 12/20/2017   Seizure (HCC) 12/20/2017   Sleep apnea, unspecified 12/20/2017   Hypertension 12/20/2017    Orientation RESPIRATION BLADDER Height & Weight     Self, Time, Situation, Place  Normal External catheter Weight: (!) 149.1 kg Height:  5\' 2"  (157.5 cm)  BEHAVIORAL SYMPTOMS/MOOD NEUROLOGICAL  BOWEL NUTRITION STATUS      Continent Diet (carb modified)  AMBULATORY STATUS COMMUNICATION OF NEEDS Skin   Extensive Assist Verbally Other (Comment) (cellulitis both legs)                       Personal Care Assistance Level of Assistance  Bathing, Feeding, Dressing Bathing Assistance: Maximum assistance Feeding assistance: Limited assistance Dressing Assistance: Maximum assistance     Functional Limitations Info             SPECIAL CARE FACTORS FREQUENCY  PT (By licensed PT), OT (By licensed OT)     PT Frequency: 5 times per week OT Frequency: 5 times per week            Contractures Contractures Info: Not present    Additional Factors Info  Code Status, Allergies Code Status Info: full code Allergies Info: Ativan           Current Medications (09/10/2020):  This is the current hospital active medication list Current Facility-Administered Medications  Medication Dose Route Frequency Provider Last Rate Last Admin   0.9 %  sodium chloride infusion   Intravenous PRN 11/11/2020., MD 10 mL/hr at 09/09/20 0531 250 mL at 09/09/20 0531   acetaminophen (TYLENOL) tablet 650 mg  650 mg Oral Q6H PRN Mansy, Jan A, MD   650 mg at 09/09/20 2153   Or   acetaminophen (TYLENOL) suppository 650 mg  650 mg Rectal Q6H PRN Mansy, Jan A, MD       albuterol (PROVENTIL) (2.5 MG/3ML) 0.083% nebulizer solution 2.5 mg  2.5 mg Nebulization Q6H PRN Otelia Sergeant, RPH       atorvastatin (LIPITOR) tablet 20 mg  20 mg Oral Daily Mansy, Jan A, MD   20 mg at 09/10/20 0831   ceFAZolin (ANCEF) IVPB 2g/100 mL premix  2 g Intravenous Q8H Derrek Gu, RPH 200 mL/hr at 09/10/20 0600 2 g at 09/10/20 0600   dorzolamide-timolol (COSOPT) 22.3-6.8 MG/ML ophthalmic solution 1 drop  1 drop Both Eyes BID Mansy, Jan A, MD   1 drop at 09/09/20 2143   enoxaparin (LOVENOX) injection 67.5 mg  0.5 mg/kg Subcutaneous Q24H Mansy, Jan A, MD   67.5 mg at 09/09/20 0843   furosemide (LASIX) tablet 40  mg  40 mg Oral Daily Mansy, Jan A, MD   40 mg at 09/10/20 1448   guaiFENesin-dextromethorphan (ROBITUSSIN DM) 100-10 MG/5ML syrup 10 mL  10 mL Oral Q4H PRN Mansy, Jan A, MD       hydrALAZINE (APRESOLINE) tablet 100 mg  100 mg Oral Q8H Wieting, Richard, MD       hydrocerin (EUCERIN) cream   Topical Daily Zigmund Daniel., MD   Given at 09/09/20 [phone]   insulin aspart (novoLOG) injection 0-9 Units  0-9 Units Subcutaneous TID [facility] Mansy, Jan A, MD   1 Units at 09/09/20 1220   insulin glargine (LANTUS) injection 20 Units  20 Units Subcutaneous BID Mansy, Jan A, MD   20 Units at 09/09/20 2144   labetalol (NORMODYNE) injection 10 mg  10 mg Intravenous Q2H PRN Alford Highland, MD       levETIRAcetam (KEPPRA) tablet 750 mg  750 mg Oral BID Mansy, Jan A, MD   750 mg at 09/09/20 2143   linagliptin (TRADJENTA) tablet 5 mg  5 mg Oral Daily Mansy, Jan A, MD   5 mg at 09/09/20 3149   magnesium hydroxide (MILK OF MAGNESIA) suspension 30 mL  30 mL Oral Daily PRN Mansy, Jan A, MD       [START ON 09/11/2020] metoprolol succinate (TOPROL-XL) 24 hr tablet 100 mg  100 mg Oral Daily Wieting, Richard, MD       morphine 2 MG/ML injection 2 mg  2 mg Intravenous Q4H PRN Mansy, Jan A, MD   2 mg at 09/10/20 0832   ondansetron (ZOFRAN) tablet 4 mg  4 mg Oral Q6H PRN Mansy, Jan A, MD       Or   ondansetron [facility]) injection 4 mg  4 mg Intravenous Q6H PRN Mansy, Jan A, MD       sacubitril-valsartan (ENTRESTO) 49-51 mg per tablet  1 tablet Oral BID Mansy, Jan A, MD   1 tablet at 09/10/20 7026   traZODone (DESYREL) tablet 25 mg  25 mg Oral QHS PRN Mansy, Vernetta Honey, MD         Discharge Medications: Please see discharge summary for a list of discharge medications.  Relevant Imaging Results:  Relevant Lab Results:   Additional Information SS# [id number]  Barrie Dunker, RN

## 2020-09-10 NOTE — TOC Progression Note (Signed)
Transition of Care (TOC) - Progression Note    Patient Details  Name: Grace Short. Lindenberger MRN: [id number] Date of Birth: Jul 30, 1945  Transition of Care [facility]) CM/SW Contact  Su Hilt, RN Phone Number: 09/10/2020, 8:58 AM  Clinical Narrative:    Met with the patient and discussed DC plan, I encouraged her to allow a bed search to go to [facility], She stated that she does have a Legal Guardian,  the patient stated that Cammy Copa is also her room Mate, I contacted Katrina, she stated that she is the POA not legal guardian, she is the patient's room mate as well, she stated that she is unable to lift and pull on the patient and she agrees that the patient needs to go to [facility] SNF, She stated that she will come to the hospital in a bit to try and help convince the patient to go to [facility], she stated that the patient has been to [facility] in the past and does not want to go there again.  PASSR number [id number]   Expected Discharge Plan: Home/Self Care Barriers to Discharge: Barriers Resolved  Expected Discharge Plan and Services Expected Discharge Plan: Home/Self Care   Discharge Planning Services: CM Consult   Living arrangements for the past 2 months: Single Family Home                 DME Arranged: N/A DME Agency: [place]                   Social Determinants of Health (SDOH) Interventions    Readmission Risk Interventions No flowsheet data found.

## 2020-09-10 NOTE — TOC Progression Note (Addendum)
Transition of Care (TOC) - Progression Note    Patient Details  Name: Grace Short. [place] MRN: [id number] Date of Birth: [birth year]  [facility]) CM/SW Contact  Su Hilt, RN Phone Number: 09/10/2020, 3:47 PM  Clinical Narrative:    Met with the patient and [name], reviewed the bed offer and location of facility, the patient has agreed to [facility],  uploaded the information to [facility] to obtain [facility] [place], ref number [id number]   Expected Discharge Plan: Home/Self Care Barriers to Discharge: Barriers Resolved  Expected Discharge Plan and Services Expected Discharge Plan: Home/Self Care   Discharge Planning Services: CM Consult   Living arrangements for the past 2 months: Single Family Home                 DME Arranged: N/A DME Agency: [place]                   Social Determinants of Health (SDOH) Interventions    Readmission Risk Interventions No flowsheet data found.

## 2020-09-10 NOTE — Consult Note (Signed)
NAME: Grace Short  DOB: January 07, 1946  MRN: [id number]  Date/Time: 09/10/2020 3:22 PM  REQUESTING PROVIDER: Dr.Wieting Subjective:  REASON FOR CONSULT: lymphedema with fever ? Grace Short is a 75 y.o. female with a history of b/l lower extremity lymphedema, DM,HTN, SSS s/p pacemaker presented from home  On 09/06/20 with increasing swelling legs, inability to walk, weeping sores, and fever Pt lives with her daughter who noticed that she was not able to get her off the recliner. In the ED vitals was 98.8, BP 148/78 , HR 86 and sats 97%. Blood culture was sent and she was started on vanco and ceftriaxone which was chnaged to cefazolin yesterday. She had a fever of 100.9 last night and wbc was 13.2 and I asked to see her Pt says she is feeling better than when she came to the hospital. Experienced fever last night. No cough, sob, appetite okay, no nausea, vomiting or diarrhea. She has not been on any antibiotics before admission, recently   In the past she had seen vascular for the lymphedema and had pumps and compression wraps. As per daughter [facility] and had home health services until 08/08/20. Since then she as not received anyway and daughter is unable to lift her legs and take care as she has medical issues herself   Past Medical History:  Diagnosis Date   Chronic acquired lymphedema    Chronic heart failure with preserved ejection fraction (HFpEF) (HCC)    COPD (chronic obstructive pulmonary disease) (HCC)    CVA (cerebral vascular accident) (HCC)    DM (diabetes mellitus) (HCC)    HLD (hyperlipidemia)    Hypertension    Morbid obesity with BMI of 50.0-59.9, adult (HCC)    Partial seizure disorder (HCC)    SSS (sick sinus syndrome) (HCC)     Past Surgical History:  Procedure Laterality Date   APPENDECTOMY     BREAST SURGERY     colectomy     rectal surgery   HERNIA REPAIR     PACEMAKER IMPLANT     TONSILLECTOMY      Social History   Socioeconomic History    Marital status: Single    Spouse name: Not on file   Number of children: Not on file   Years of education: Not on file   Highest education level: Not on file  Occupational History   Not on file  Tobacco Use   Smoking status: Never   Smokeless tobacco: Never  Vaping Use   Vaping Use: Never used  Substance and Sexual Activity   Alcohol use: Never   Drug use: Never   Sexual activity: Not on file  Other Topics Concern   Not on file  Social History Narrative   Not on file   Social Determinants of Health   Financial Resource Strain: Not on file  Food Insecurity: Not on file  Transportation Needs: Not on file  Physical Activity: Not on file  Stress: Not on file  Social Connections: Not on file  Intimate Partner Violence: Not on file    Family History  Problem Relation Age of Onset   Diabetes Sister    Heart disease Sister    Allergies  Allergen Reactions   Ativan [Lorazepam]    I? Current Facility-Administered Medications  Medication Dose Route Frequency Provider Last Rate Last Admin   0.9 %  sodium chloride infusion   Intravenous PRN Zigmund DanielPowell, A Caldwell Jr., MD 10 mL/hr at 09/09/20 [phone]  250 mL at 09/09/20 0531   acetaminophen (TYLENOL) tablet 650 mg  650 mg Oral Q6H PRN Mansy, Jan A, MD   650 mg at 09/09/20 2153   Or   acetaminophen (TYLENOL) suppository 650 mg  650 mg Rectal Q6H PRN Mansy, Jan A, MD       albuterol (PROVENTIL) (2.5 MG/3ML) 0.083% nebulizer solution 2.5 mg  2.5 mg Nebulization Q6H PRN Otelia Sergeant, RPH       atorvastatin (LIPITOR) tablet 20 mg  20 mg Oral Daily Mansy, Jan A, MD   20 mg at 09/10/20 0831   carvedilol (COREG) tablet 25 mg  25 mg Oral BID WC Wieting, Richard, MD       ceFAZolin (ANCEF) IVPB 2g/100 mL premix  2 g Intravenous Q8H Ozella Almond L, RPH 200 mL/hr at 09/10/20 1308 2 g at 09/10/20 1308   dorzolamide-timolol (COSOPT) 22.3-6.8 MG/ML ophthalmic solution 1 drop  1 drop Both Eyes BID Mansy, Jan A, MD   1 drop at 09/10/20 1110    enoxaparin (LOVENOX) injection 67.5 mg  0.5 mg/kg Subcutaneous Q24H Mansy, Jan A, MD   67.5 mg at 09/10/20 1026   furosemide (LASIX) tablet 40 mg  40 mg Oral Daily Mansy, Jan A, MD   40 mg at 09/10/20 4098   guaiFENesin-dextromethorphan (ROBITUSSIN DM) 100-10 MG/5ML syrup 10 mL  10 mL Oral Q4H PRN Mansy, Jan A, MD       hydrALAZINE (APRESOLINE) tablet 100 mg  100 mg Oral Q8H Wieting, Richard, MD   100 mg at 09/10/20 1306   hydrocerin (EUCERIN) cream   Topical Daily Zigmund Daniel., MD   Given at 09/10/20 1029   insulin aspart (novoLOG) injection 0-9 Units  0-9 Units Subcutaneous TID [facility] Mansy, Jan A, MD   2 Units at 09/10/20 1219   insulin glargine (LANTUS) injection 20 Units  20 Units Subcutaneous BID Mansy, Jan A, MD   20 Units at 09/10/20 1033   labetalol (NORMODYNE) injection 10 mg  10 mg Intravenous Q2H PRN Alford Highland, MD       levETIRAcetam (KEPPRA) tablet 750 mg  750 mg Oral BID Mansy, Jan A, MD   750 mg at 09/10/20 1032   linagliptin (TRADJENTA) tablet 5 mg  5 mg Oral Daily Mansy, Jan A, MD   5 mg at 09/10/20 1032   magnesium hydroxide (MILK OF MAGNESIA) suspension 30 mL  30 mL Oral Daily PRN Mansy, Jan A, MD       morphine 2 MG/ML injection 2 mg  2 mg Intravenous Q4H PRN Mansy, Jan A, MD   2 mg at 09/10/20 0832   ondansetron (ZOFRAN) tablet 4 mg  4 mg Oral Q6H PRN Mansy, Jan A, MD       Or   ondansetron [facility]) injection 4 mg  4 mg Intravenous Q6H PRN Mansy, Jan A, MD       sacubitril-valsartan (ENTRESTO) 97-103 mg per tablet  1 tablet Oral BID Alford Highland, MD       traZODone (DESYREL) tablet 25 mg  25 mg Oral QHS PRN Mansy, Jan A, MD         Abtx:  Anti-infectives (From admission, onward)    Start     Dose/Rate Route Frequency Ordered Stop   09/08/20 0600  vancomycin (VANCOREADY) IVPB 1500 mg/300 mL  Status:  Discontinued        1,500 mg 150 mL/hr over 120 Minutes Intravenous Every 24 hours 09/07/20 0517 09/07/20 1724  09/08/20 0600  ceFAZolin (ANCEF) IVPB  2g/100 mL premix        2 g 200 mL/hr over 30 Minutes Intravenous Every 8 hours 09/07/20 2058     09/07/20 1200  cefTRIAXone (ROCEPHIN) 2 g in sodium chloride 0.9 % 100 mL IVPB  Status:  Discontinued        2 g 200 mL/hr over 30 Minutes Intravenous Every 24 hours 09/07/20 0136 09/07/20 1724   09/07/20 0615  vancomycin (VANCOREADY) IVPB 1500 mg/300 mL       See Hyperspace for full Linked Orders Report.   1,500 mg 150 mL/hr over 120 Minutes Intravenous  Once 09/07/20 0420 09/07/20 0828   09/07/20 0515  vancomycin (VANCOREADY) IVPB 1000 mg/200 mL       See Hyperspace for full Linked Orders Report.   1,000 mg 200 mL/hr over 60 Minutes Intravenous  Once 09/07/20 0420 09/07/20 0612   09/07/20 0230  vancomycin (VANCOREADY) IVPB 1500 mg/300 mL  Status:  Discontinued       See Hyperspace for full Linked Orders Report.   1,500 mg 150 mL/hr over 120 Minutes Intravenous  Once 09/07/20 0129 09/07/20 0420   09/07/20 0145  vancomycin (VANCOCIN) IVPB 1000 mg/200 mL premix  Status:  Discontinued        1,000 mg 200 mL/hr over 60 Minutes Intravenous  Once 09/07/20 0136 09/07/20 0157   09/07/20 0130  vancomycin (VANCOCIN) 2,500 mg in sodium chloride 0.9 % 500 mL IVPB  Status:  Discontinued        2,500 mg 250 mL/hr over 120 Minutes Intravenous  Once 09/07/20 0126 09/07/20 0127   09/07/20 0130  vancomycin (VANCOCIN) IVPB 1000 mg/200 mL premix  Status:  Discontinued       See Hyperspace for full Linked Orders Report.   1,000 mg 200 mL/hr over 60 Minutes Intravenous  Once 09/07/20 0128 09/07/20 0420   09/07/20 0115  cefTRIAXone (ROCEPHIN) 1 g in sodium chloride 0.9 % 100 mL IVPB        1 g 200 mL/hr over 30 Minutes Intravenous STAT 09/07/20 0114 09/07/20 0203       REVIEW OF SYSTEMS:  Const:  fever, negative chills, negative weight loss Eyes: negative diplopia or visual changes, negative eye pain ENT: negative coryza, negative sore throat Resp: negative cough, hemoptysis, dyspnea Cards: negative  for chest pain, palpitations, l GU: negative for frequency, dysuria and hematuria GI: Negative for abdominal pain, diarrhea, bleeding, constipation Skin: negative for rash and pruritus Heme: negative for easy bruising and gum/nose bleeding MS: generalized weakness Restricted mobility- uses a walker but off late not able to walk Neurolo:negative for headaches, dizziness, vertigo, memory problems  Psych: negative for feelings of anxiety, depression  Endocrine:  diabetes Allergy/Immunology- as above Objective:  VITALS:  BP (!) 194/81 (BP Location: Right Arm)   Pulse 71   Temp 97.9 F (36.6 C)   Resp 18   Ht 5\' 2"  (1.575 m)   Wt (!) 149.1 kg   SpO2 97%   BMI 60.12 kg/m  PHYSICAL EXAM:  General: Alert, cooperative, no distress, increased BMI Head: Normocephalic, without obvious abnormality, atraumatic. Eyes: Conjunctivae clear, anicteric sclerae. Pupils are equal ENT Nares normal. No drainage or sinus tenderness. Lips, mucosa, and tongue normal. No Thrush Neck: Supple, symmetrical, no adenopathy, thyroid: non tender no carotid bruit and no JVD. Back: did not examine. Lungs: Clear to auscultation bilaterally. No Wheezing or Rhonchi. No rales. Heart: Regular rate and rhythm, no murmur, rub or gallop. Pacemaker  site okay Abdomen: Soft, non-tender,not distended. Bowel sounds normal. No masses, lap scar Extremities: b/l lower extremity lymphedema Discharge and superficial ulceration legs    Skin: as above Lymph: Cervical, supraclavicular normal. Neurologic: Grossly non-focal Pertinent Labs Lab Results CBC    Component Value Date/Time   WBC 13.6 (H) 09/10/2020 0731   RBC 4.36 09/10/2020 0731   HGB 11.4 (L) 09/10/2020 0731   HCT 36.0 09/10/2020 0731   PLT 239 09/10/2020 0731   MCV 82.6 09/10/2020 0731   MCH 26.1 09/10/2020 0731   MCHC 31.7 09/10/2020 0731   RDW 14.5 09/10/2020 0731   LYMPHSABS 1.8 09/10/2020 0731   MONOABS 1.2 (H) 09/10/2020 0731   EOSABS 0.1  09/10/2020 0731   BASOSABS 0.0 09/10/2020 0731    CMP Latest Ref Rng & Units 09/09/2020 09/08/2020 09/07/2020  Glucose 70 - 99 mg/dL 852(D) 782(U) 235(T)  BUN 8 - 23 mg/dL 7(L) 9 13  Creatinine 6.14 - 1.00 mg/dL 4.31 5.40 0.86  Sodium 135 - 145 mmol/L 139 141 140  Potassium 3.5 - 5.1 mmol/L 3.7 3.5 4.0  Chloride 98 - 111 mmol/L 102 109 107  CO2 22 - 32 mmol/L 30 29 25   Calcium 8.9 - 10.3 mg/dL 8.1(L) 7.9(L) 8.4(L)  Total Protein 6.5 - 8.1 g/dL 6.1(L) 5.5(L) -  Total Bilirubin 0.3 - 1.2 mg/dL 0.6 0.6 -  Alkaline Phos 38 - 126 U/L 45 41 -  AST 15 - 41 U/L 24 34 -  ALT 0 - 44 U/L 12 15 -      Microbiology: Recent Results (from the past 240 hour(s))  Blood Culture (routine x 2)     Status: None (Preliminary result)   Collection Time: 09/06/20  6:18 PM   Specimen: BLOOD  Result Value Ref Range Status   Specimen Description BLOOD RIGHT ANTECUBITAL  Final   Special Requests   Final    BOTTLES DRAWN AEROBIC AND ANAEROBIC Blood Culture adequate volume   Culture   Final    NO GROWTH 3 DAYS Performed at [facility], [address], [place], [place]    Report Status PENDING  Incomplete  Blood Culture (routine x 2)     Status: None (Preliminary result)   Collection Time: 09/06/20  6:18 PM   Specimen: BLOOD  Result Value Ref Range Status   Specimen Description BLOOD BLOOD LEFT WRIST  Final   Special Requests   Final    BOTTLES DRAWN AEROBIC AND ANAEROBIC Blood Culture adequate volume   Culture   Final    NO GROWTH 3 DAYS Performed at [facility], [address] [place], [place]    Report Status PENDING  Incomplete  Resp Panel by RT-PCR (Flu A&B, Covid) Nasopharyngeal Swab     Status: None   Collection Time: 09/07/20  1:37 AM   Specimen: Nasopharyngeal Swab; Nasopharyngeal(NP) swabs in vial transport medium  Result Value Ref Range Status   SARS Coronavirus 2 by RT PCR NEGATIVE NEGATIVE Final    Comment: (NOTE) SARS-CoV-2 target nucleic acids  are NOT DETECTED.  The SARS-CoV-2 RNA is generally detectable in upper respiratory specimens during the acute phase of infection. The lowest concentration of SARS-CoV-2 viral copies this assay can detect is 138 copies/mL. A negative result does not preclude SARS-Cov-2 infection and should not be used as the sole basis for treatment or other patient management decisions. A negative result may occur with  improper specimen collection/handling, submission of specimen other than nasopharyngeal swab, presence of  viral mutation(s) within the areas targeted by this assay, and inadequate number of viral copies(<138 copies/mL). A negative result must be combined with clinical observations, patient history, and epidemiological information. The expected result is Negative.  Fact Sheet for Patients:  BloggerCourse.com  Fact Sheet for Healthcare Providers:  SeriousBroker.it  This test is no t yet approved or cleared by the [place] FDA and  has been authorized for detection and/or diagnosis of SARS-CoV-2 by FDA under an Emergency Use Authorization (EUA). This EUA will remain  in effect (meaning this test can be used) for the duration of the COVID-19 declaration under Section 564(b)(1) of the Act, 21 U.S.C.section 360bbb-3(b)(1), unless the authorization is terminated  or revoked sooner.       Influenza A by PCR NEGATIVE NEGATIVE Final   Influenza B by PCR NEGATIVE NEGATIVE Final    Comment: (NOTE) The Xpert Xpress SARS-CoV-2/FLU/RSV plus assay is intended as an aid in the diagnosis of influenza from Nasopharyngeal swab specimens and should not be used as a sole basis for treatment. Nasal washings and aspirates are unacceptable for Xpert Xpress SARS-CoV-2/FLU/RSV testing.  Fact Sheet for Patients: BloggerCourse.com  Fact Sheet for Healthcare Providers: SeriousBroker.it  This test is  not yet approved or cleared by the [place] FDA and has been authorized for detection and/or diagnosis of SARS-CoV-2 by FDA under an Emergency Use Authorization (EUA). This EUA will remain in effect (meaning this test can be used) for the duration of the COVID-19 declaration under Section 564(b)(1) of the Act, 21 U.S.C. section 360bbb-3(b)(1), unless the authorization is terminated or revoked.  Performed at [facility], [address], [place], [place] [address]   Urine culture     Status: Abnormal   Collection Time: 09/07/20  6:38 AM   Specimen: In/Out Cath Urine  Result Value Ref Range Status   Specimen Description   Final    IN/OUT CATH URINE Performed at [facility], [address], [place], [place] [address]    Special Requests   Final    NONE Performed at [facility], [address] [place], [place] [address]    Culture (A)  Final    20,000 COLONIES/mL STAPHYLOCOCCUS AUREUS 80,000 COLONIES/mL DIPHTHEROIDS(CORYNEBACTERIUM SPECIES) Standardized susceptibility testing for this organism is not available. Performed at [facility], [address] [place], [place] [address]    Report Status 09/09/2020 FINAL  Final   Organism ID, Bacteria STAPHYLOCOCCUS AUREUS (A)  Final      Susceptibility   Staphylococcus aureus - MIC*    CIPROFLOXACIN <=0.5 SENSITIVE Sensitive     GENTAMICIN <=0.5 SENSITIVE Sensitive     NITROFURANTOIN 32 SENSITIVE Sensitive     OXACILLIN 0.5 SENSITIVE Sensitive     TETRACYCLINE <=1 SENSITIVE Sensitive     VANCOMYCIN 1 SENSITIVE Sensitive     TRIMETH/SULFA <=10 SENSITIVE Sensitive     CLINDAMYCIN <=0.25 SENSITIVE Sensitive     RIFAMPIN <=0.5 SENSITIVE Sensitive     Inducible Clindamycin NEGATIVE Sensitive     * 20,000 COLONIES/mL STAPHYLOCOCCUS AUREUS    IMAGING RESULTS: CXR- no active disease I have personally reviewed the films ? Impression/Recommendation Lymphedema b/l lower extremities with  cellulitis and superficial erosions. Was on IV vanco and ceftriaxone which has been switched to cefazolin on 09/08/20. Will add flagyl. If fever recurs and wbc increases will have to switch antibiotics and get culture to see whether she is colonized with organisms  ?HTN on hydralazine, Entresto  CHF  SS s/p  pacemaker ? ?Daughter is requesting home health services for lymphedema management ___________________________________________________ Discussed with patient, daughter and requesting provider Note:  This document was prepared using Dragon voice recognition software and may include unintentional dictation errors.

## 2020-09-10 NOTE — Progress Notes (Signed)
PT Cancellation Note  Patient Details Name: Grace Short MRN: [id number] DOB: 05/06/1945   Cancelled Treatment:    Reason Eval/Treat Not Completed: Other (comment)  Pt offered and encouraged to participate in session.  Stated she has been moving this morning already.  RN in stated it was ADL care.  She refused further session today.  "No today please." Despite education for importance of mobility to or at least supine ex she continued to decline.   Danielle Dess 09/10/2020, 10:49 AM

## 2020-09-10 NOTE — TOC Progression Note (Signed)
Transition of Care (TOC) - Progression Note    Patient Details  Name: Grace Short. [place] MRN: [id number] Date of Birth: [birth year]  Transition of Care [facility]) CM/SW Contact  Su Hilt, RN Phone Number: 09/10/2020, 2:03 PM  Clinical Narrative:     Met with the patient and her room mate Katrina in the room, reviewed some SNF options, she wants to stay in [place] area, I reached out to some facilities and asked them to make a bed offer, once offers are obtained will review with the patient and her room mate  Expected Discharge Plan: Home/Self Care Barriers to Discharge: Barriers Resolved  Expected Discharge Plan and Services Expected Discharge Plan: Home/Self Care   Discharge Planning Services: CM Consult   Living arrangements for the past 2 months: Single Family Home                 DME Arranged: N/A DME Agency: [facility]                   Social Determinants of Health (SDOH) Interventions    Readmission Risk Interventions No flowsheet data found.

## 2020-09-10 NOTE — Progress Notes (Signed)
Patient ID: Grace Short, female   DOB: 10/02/1945, 75 y.o.   MRN: [id number] [facility] PROGRESS NOTE  Grace Short ZSW:[id number] DOB: September 15, 1945 DOA: 09/06/2020 PCP: Shane Crutch, PA  HPI/Subjective: Patient still feels weak.  Does have some pain in her legs.  Had a temperature last night of 100.9.  No shortness of breath.  Admitted with bilateral cellulitis.  Objective: Vitals:   09/10/20 1230 09/10/20 1547  BP: (!) 171/74 (!) 178/80  Pulse:  81  Resp:  16  Temp:  99.3 F (37.4 C)  SpO2:  96%    Intake/Output Summary (Last 24 hours) at 09/10/2020 1557 Last data filed at 09/10/2020 1008 Gross per 24 hour  Intake 240 ml  Output 2400 ml  Net -2160 ml   Filed Weights   09/06/20 1814 09/07/20 0400  Weight: 136.1 kg (!) 149.1 kg    ROS: Review of Systems  Respiratory:  Negative for shortness of breath.   Cardiovascular:  Negative for chest pain.  Gastrointestinal:  Negative for abdominal pain, nausea and vomiting.  Musculoskeletal:  Positive for myalgias.  Exam: Physical Exam HENT:     Head: Normocephalic.     Mouth/Throat:     Pharynx: No oropharyngeal exudate.  Eyes:     General: Lids are normal.     Conjunctiva/sclera: Conjunctivae normal.     Pupils: Pupils are equal, round, and reactive to light.  Cardiovascular:     Rate and Rhythm: Normal rate and regular rhythm.     Heart sounds: Normal heart sounds, S1 normal and S2 normal.  Pulmonary:     Breath sounds: Normal breath sounds. No decreased breath sounds, wheezing, rhonchi or rales.  Abdominal:     Palpations: Abdomen is soft.     Tenderness: There is no abdominal tenderness.  Musculoskeletal:     Right upper arm: Swelling present.     Left upper arm: Swelling present.     Right lower leg: Swelling present.     Left lower leg: Swelling present.  Skin:    General: Skin is warm.     Comments: Lower legs covered with dressing by wound care.  Right lower leg does have a bandage over an area.  Slight  pain when palpating around that area.  Neurological:     Mental Status: She is alert and oriented to person, place, and time.     Data Reviewed: Basic Metabolic Panel: Recent Labs  Lab 09/06/20 1818 09/07/20 0535 09/08/20 0515 09/09/20 0422  NA 140 140 141 139  K 3.9 4.0 3.5 3.7  CL 105 107 109 102  CO2 25 25 29 30   GLUCOSE 129* 152* 104* 163*  BUN 15 13 9  7*  CREATININE 0.85 0.79 0.66 0.61  CALCIUM 8.9 8.4* 7.9* 8.1*  MG  --   --  1.7 1.7  PHOS  --   --  2.7 2.7   Liver Function Tests: Recent Labs  Lab 09/06/20 1818 09/08/20 0515 09/09/20 0422  AST 26 34 24  ALT 11 15 12   ALKPHOS 53 41 45  BILITOT 0.9 0.6 0.6  PROT 7.5 5.5* 6.1*  ALBUMIN 3.3* 2.4* 2.6*    CBC: Recent Labs  Lab 09/06/20 1818 09/07/20 0535 09/08/20 0515 09/09/20 0422 09/10/20 0731  WBC 16.2* 11.6* 9.1 12.3* 13.6*  NEUTROABS 14.0*  --  6.0 9.8* 10.5*  HGB 10.8* 10.2* 9.1* 10.4* 11.4*  HCT 34.9* 32.1* 27.9* 33.0* 36.0  MCV 84.1 81.9 81.6 82.7 82.6  PLT 284 267  244 255 239   BNP (last 3 results) Recent Labs    12/07/19 1337 12/11/19 0535  BNP 152.7* 35.6     CBG: Recent Labs  Lab 09/09/20 1140 09/09/20 1628 09/09/20 2038 09/10/20 0809 09/10/20 1135  GLUCAP 129* 112* 107* 96 179*    Recent Results (from the past 240 hour(s))  Blood Culture (routine x 2)     Status: None (Preliminary result)   Collection Time: 09/06/20  6:18 PM   Specimen: BLOOD  Result Value Ref Range Status   Specimen Description BLOOD RIGHT ANTECUBITAL  Final   Special Requests   Final    BOTTLES DRAWN AEROBIC AND ANAEROBIC Blood Culture adequate volume   Culture   Final    NO GROWTH 3 DAYS Performed at [facility], [address] [place], [place] [address]    Report Status PENDING  Incomplete  Blood Culture (routine x 2)     Status: None (Preliminary result)   Collection Time: 09/06/20  6:18 PM   Specimen: BLOOD  Result Value Ref Range Status   Specimen Description BLOOD BLOOD LEFT  WRIST  Final   Special Requests   Final    BOTTLES DRAWN AEROBIC AND ANAEROBIC Blood Culture adequate volume   Culture   Final    NO GROWTH 3 DAYS Performed at [facility], [address] [place], [place] [address]    Report Status PENDING  Incomplete  Resp Panel by RT-PCR (Flu A&B, Covid) Nasopharyngeal Swab     Status: None   Collection Time: 09/07/20  1:37 AM   Specimen: Nasopharyngeal Swab; Nasopharyngeal(NP) swabs in vial transport medium  Result Value Ref Range Status   SARS Coronavirus 2 by RT PCR NEGATIVE NEGATIVE Final    Comment: (NOTE) SARS-CoV-2 target nucleic acids are NOT DETECTED.  The SARS-CoV-2 RNA is generally detectable in upper respiratory specimens during the acute phase of infection. The lowest concentration of SARS-CoV-2 viral copies this assay can detect is 138 copies/mL. A negative result does not preclude SARS-Cov-2 infection and should not be used as the sole basis for treatment or other patient management decisions. A negative result may occur with  improper specimen collection/handling, submission of specimen other than nasopharyngeal swab, presence of viral mutation(s) within the areas targeted by this assay, and inadequate number of viral copies(<138 copies/mL). A negative result must be combined with clinical observations, patient history, and epidemiological information. The expected result is Negative.  Fact Sheet for Patients:  BloggerCourse.comhttps://www.fda.gov/media/152166/download  Fact Sheet for Healthcare Providers:  SeriousBroker.ithttps://www.fda.gov/media/152162/download  This test is no t yet approved or cleared by the [place] FDA and  has been authorized for detection and/or diagnosis of SARS-CoV-2 by FDA under an Emergency Use Authorization (EUA). This EUA will remain  in effect (meaning this test can be used) for the duration of the COVID-19 declaration under Section 564(b)(1) of the Act, 21 U.S.C.section 360bbb-3(b)(1), unless the  authorization is terminated  or revoked sooner.       Influenza A by PCR NEGATIVE NEGATIVE Final   Influenza B by PCR NEGATIVE NEGATIVE Final    Comment: (NOTE) The Xpert Xpress SARS-CoV-2/FLU/RSV plus assay is intended as an aid in the diagnosis of influenza from Nasopharyngeal swab specimens and should not be used as a sole basis for treatment. Nasal washings and aspirates are unacceptable for Xpert Xpress SARS-CoV-2/FLU/RSV testing.  Fact Sheet for Patients: BloggerCourse.comhttps://www.fda.gov/media/152166/download  Fact Sheet for Healthcare Providers: SeriousBroker.ithttps://www.fda.gov/media/152162/download  This test is not yet approved or cleared by the  [place] [employer] and has been authorized for detection and/or diagnosis of SARS-CoV-2 by FDA under an TEFL teacher (EUA). This EUA will remain in effect (meaning this test can be used) for the duration of the COVID-19 declaration under Section 564(b)(1) of the Act, 21 U.S.C. section 360bbb-3(b)(1), unless the authorization is terminated or revoked.  Performed at [facility], [address], [place], [place] [address]   Urine culture     Status: Abnormal   Collection Time: 09/07/20  6:38 AM   Specimen: In/Out Cath Urine  Result Value Ref Range Status   Specimen Description   Final    IN/OUT CATH URINE Performed at [facility], [address] [place], [place] [address]    Special Requests   Final    NONE Performed at [facility], [address] [place], [place] [address]    Culture (A)  Final    20,000 COLONIES/mL STAPHYLOCOCCUS AUREUS 80,000 COLONIES/mL DIPHTHEROIDS(CORYNEBACTERIUM SPECIES) Standardized susceptibility testing for this organism is not available. Performed at [facility], [address] [place], [place] [address]    Report Status 09/09/2020 FINAL  Final   Organism ID, Bacteria STAPHYLOCOCCUS AUREUS (A)  Final      Susceptibility   Staphylococcus aureus - MIC*     CIPROFLOXACIN <=0.5 SENSITIVE Sensitive     GENTAMICIN <=0.5 SENSITIVE Sensitive     NITROFURANTOIN 32 SENSITIVE Sensitive     OXACILLIN 0.5 SENSITIVE Sensitive     TETRACYCLINE <=1 SENSITIVE Sensitive     VANCOMYCIN 1 SENSITIVE Sensitive     TRIMETH/SULFA <=10 SENSITIVE Sensitive     CLINDAMYCIN <=0.25 SENSITIVE Sensitive     RIFAMPIN <=0.5 SENSITIVE Sensitive     Inducible Clindamycin NEGATIVE Sensitive     * 20,000 COLONIES/mL STAPHYLOCOCCUS AUREUS      Scheduled Meds:  atorvastatin  20 mg Oral Daily   carvedilol  25 mg Oral BID WC   dorzolamide-timolol  1 drop Both Eyes BID   enoxaparin (LOVENOX) injection  0.5 mg/kg Subcutaneous Q24H   furosemide  40 mg Oral Daily   hydrALAZINE  100 mg Oral Q8H   hydrocerin   Topical Daily   insulin aspart  0-9 Units Subcutaneous TID AC & HS   insulin glargine  20 Units Subcutaneous BID   levETIRAcetam  750 mg Oral BID   linagliptin  5 mg Oral Daily   sacubitril-valsartan  1 tablet Oral BID   Continuous Infusions:  sodium chloride 250 mL (09/09/20 0531)    ceFAZolin (ANCEF) IV 2 g (09/10/20 1308)    Assessment/Plan:  Bilateral lower extremity cellulitis and lymphedema.  Initially the patient was started on Rocephin and vancomycin and then switched over to Ancef alone.  Patient did spike a temperature of 100.9 last night.  I consulted to infectious disease to evaluate the patient and decide on antibiotics whether we need to keep the same or switch antibiotics Hypertensive urgency.  Blood pressure still elevated.  Increase Entresto dose and switch Toprol over to Coreg.  Continue Lasix and hydralazine. Type 2 diabetes mellitus with hyperlipidemia.  Continue glargine insulin and linagliptin.  Patient also on aspart insulin. Chronic diastolic congestive heart failure.  Continue Entresto, Toprol and Lasix Seizure disorder.  Continue Keppra Weakness.  Physical therapy recommending rehab.      Code Status:     Code Status Orders  (From  admission, onward)           Start     Ordered  09/07/20 0125  Full code  Continuous        09/07/20 0136           Code Status History     Date Active Date Inactive Code Status Order ID Comments User Context   12/07/2019 1742 12/15/2019 0138 Full Code [id number]  Norins, Rosalyn Gess, MD ED      Family Communication: spoke with friend on phone Disposition Plan: Status is: Inpatient  Dispo: The patient is from: Home              Anticipated d/c is to: Rehab              Patient currently spiked fever last 100.9, ID consultation today   Difficult to place patient.  No.  Consultants: Infectious disease  Antibiotics: Ancef  Time spent: 28 minutes  [employer]

## 2020-09-11 DIAGNOSIS — I10 Essential (primary) hypertension: Secondary | ICD-10-CM

## 2020-09-11 LAB — BASIC METABOLIC PANEL
Anion gap: 10 (ref 5–15)
BUN: 8 mg/dL (ref 8–23)
CO2: 30 mmol/L (ref 22–32)
Calcium: 8 mg/dL — ABNORMAL LOW (ref 8.9–10.3)
Chloride: 101 mmol/L (ref 98–111)
Creatinine, Ser: 0.55 mg/dL (ref 0.44–1.00)
GFR, Estimated: >60 mL/min (ref >60)
Glucose, Bld: 110 mg/dL — ABNORMAL HIGH (ref 70–99)
Potassium: 3.1 mmol/L — ABNORMAL LOW (ref 3.5–5.1)
Sodium: 141 mmol/L (ref 135–145)

## 2020-09-11 LAB — GLUCOSE, CAPILLARY
Glucose-Capillary: 122 mg/dL — ABNORMAL HIGH (ref 70–99)
Glucose-Capillary: 160 mg/dL — ABNORMAL HIGH (ref 70–99)
Glucose-Capillary: 166 mg/dL — ABNORMAL HIGH (ref 70–99)
Glucose-Capillary: 149 mg/dL — ABNORMAL HIGH (ref 70–99)

## 2020-09-11 LAB — CBC
HCT: 34.3 % — ABNORMAL LOW (ref 36.0–46.0)
Hemoglobin: 10.7 g/dL — ABNORMAL LOW (ref 12.0–15.0)
MCH: 25.8 pg — ABNORMAL LOW (ref 26.0–34.0)
MCHC: 31.2 g/dL (ref 30.0–36.0)
MCV: 82.7 fL (ref 80.0–100.0)
Platelets: 257 10*3/uL (ref 150–400)
RBC: 4.15 MIL/uL (ref 3.87–5.11)
RDW: 14.7 % (ref 11.5–15.5)
WBC: 11.7 10*3/uL — ABNORMAL HIGH (ref 4.0–10.5)
nRBC: 0 % (ref 0.0–0.2)

## 2020-09-11 LAB — MAGNESIUM: Magnesium: 1.9 mg/dL (ref 1.7–2.4)

## 2020-09-11 MED ORDER — CARVEDILOL 12.5 MG PO TABS: 12.5000 mg | ORAL_TABLET | Freq: Two times a day (BID) | ORAL | Status: DC

## 2020-09-11 MED ORDER — FLUCONAZOLE 100 MG PO TABS
200.0000 mg | ORAL_TABLET | Freq: Every day | ORAL | Status: DC
  Filled 2020-09-11: qty 2

## 2020-09-11 MED ORDER — POTASSIUM CHLORIDE CRYS ER 20 MEQ PO TBCR
40.0000 meq | EXTENDED_RELEASE_TABLET | Freq: Once | ORAL | Status: AC
  Administered 2020-09-11: 40 meq via ORAL
  Filled 2020-09-11: qty 2

## 2020-09-11 MED ORDER — HYDRALAZINE HCL 25 MG PO TABS: 25.0000 mg | ORAL_TABLET | Freq: Three times a day (TID) | ORAL | Status: DC

## 2020-09-11 MED ORDER — FLUCONAZOLE 100 MG PO TABS
100.0000 mg | ORAL_TABLET | Freq: Every day | ORAL | Status: AC
  Administered 2020-09-11 – 2020-09-15 (×5): 100 mg via ORAL
  Filled 2020-09-11 (×5): qty 1

## 2020-09-11 MED ORDER — CARVEDILOL 25 MG PO TABS
25.0000 mg | ORAL_TABLET | Freq: Two times a day (BID) | ORAL | Status: DC
  Filled 2020-09-11: qty 1

## 2020-09-11 MED ORDER — CARVEDILOL 12.5 MG PO TABS
12.5000 mg | ORAL_TABLET | Freq: Two times a day (BID) | ORAL | Status: DC
  Administered 2020-09-12 – 2020-09-14 (×5): 12.5 mg via ORAL
  Filled 2020-09-11 (×5): qty 1

## 2020-09-11 MED ORDER — FLUCONAZOLE 100 MG PO TABS: 100.0000 mg | ORAL_TABLET | Freq: Every day | ORAL | Status: DC

## 2020-09-11 NOTE — Progress Notes (Signed)
ID Pt has a dry cough Says her throat is scratchy O/e Stable Patient Vitals for the past 24 hrs:  BP Temp Temp src Pulse Resp SpO2  09/11/20 0800 (!) 130/48 98.3 F (36.8 C) Oral (!) 59 16 96 %  09/11/20 0535 (!) 151/54 98.4 F (36.9 C) Oral 72 18 93 %  09/10/20 2030 (!) 135/48 98.9 F (37.2 C) Oral 81 18 94 %  09/10/20 1547 (!) 178/80 99.3 F (37.4 C) Oral 81 16 96 %  09/10/20 1230 (!) 171/74 -- -- -- -- --   Chest b/l air entry [id number] Abd soft Legs dressing changed Both legs rae less edematous and dry Area of superficial ulceration over the back of the rt leg and left ankle               Intertrigo between the skin folds over the inguinal, pannus and legs CNS non focal  Labs CBC Latest Ref Rng & Units 09/11/2020 09/10/2020 09/09/2020  WBC 4.0 - 10.5 K/uL 11.7(H) 13.6(H) 12.3(H)  Hemoglobin 12.0 - 15.0 g/dL 10.7(L) 11.4(L) 10.4(L)  Hematocrit 36.0 - 46.0 % 34.3(L) 36.0 33.0(L)  Platelets 150 - 400 K/uL 257 239 255    CMP Latest Ref Rng & Units 09/11/2020 09/09/2020 09/08/2020  Glucose 70 - 99 mg/dL 500(X) 381(W) 299(B)  BUN 8 - 23 mg/dL 8 7(L) 9  Creatinine 7.16 - 1.00 mg/dL 9.67 8.93 8.10  Sodium 135 - 145 mmol/L 141 139 141  Potassium 3.5 - 5.1 mmol/L 3.1(L) 3.7 3.5  Chloride 98 - 111 mmol/L 101 102 109  CO2 22 - 32 mmol/L 30 30 29   Calcium 8.9 - 10.3 mg/dL 8.0(L) 8.1(L) 7.9(L)  Total Protein 6.5 - 8.1 g/dL - 6.1(L) 5.5(L)  Total Bilirubin 0.3 - 1.2 mg/dL - 0.6 0.6  Alkaline Phos 38 - 126 U/L - 45 41  AST 15 - 41 U/L - 24 34  ALT 0 - 44 U/L - 12 15    Micro BC NG UC staph aureus and dipheroids in urine is due to contamination  Impression/recommendation B/l severe lymphedema leg with cellulitis and superficial ulceration. On cefazolin and flagyl Will add fluconazole because of severe intertrigo due to yeast On discharge it would be keflex 500mg  Po Q6 or 1 gram PO Q8 +, flagyl 500mg  PO BID - both until 09/18/20. 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

## 2020-09-11 NOTE — Progress Notes (Addendum)
Patient ID: Miquel Dunn Branscomb, female   DOB: [birth year], 75 y.o.   MRN: [id number] [facility] PROGRESS NOTE  Ashlynn V. Suchecki HBZ:[id number] DOB: May 13, 1945 DOA: 09/06/2020 PCP: Shane Crutch, PA  HPI/Subjective: Patient feels okay.  No further fever.  Still has some leg pains.  No nausea or vomiting.  Admitted with bilateral cellulitis and wounds.  Objective: Vitals:   09/11/20 0535 09/11/20 0800  BP: (!) 151/54 (!) 130/48  Pulse: 72 (!) 59  Resp: 18 16  Temp: 98.4 F (36.9 C) 98.3 F (36.8 C)  SpO2: 93% 96%    Intake/Output Summary (Last 24 hours) at 09/11/2020 1138 Last data filed at 09/11/2020 1017 Gross per 24 hour  Intake --  Output 800 ml  Net -800 ml   Filed Weights   09/06/20 1814 09/07/20 0400  Weight: 136.1 kg (!) 149.1 kg    ROS: Review of Systems  Respiratory:  Negative for shortness of breath.   Cardiovascular:  Negative for chest pain.  Gastrointestinal:  Negative for abdominal pain, nausea and vomiting.  Musculoskeletal:  Positive for joint pain and myalgias.  Exam: Physical Exam HENT:     Head: Normocephalic.     Mouth/Throat:     Pharynx: No oropharyngeal exudate.  Eyes:     General: Lids are normal.     Conjunctiva/sclera: Conjunctivae normal.  Cardiovascular:     Rate and Rhythm: Normal rate and regular rhythm.     Heart sounds: Normal heart sounds, S1 normal and S2 normal.  Pulmonary:     Breath sounds: Normal breath sounds. No decreased breath sounds, wheezing, rhonchi or rales.  Abdominal:     Palpations: Abdomen is soft.     Tenderness: There is no abdominal tenderness.  Musculoskeletal:     Right upper leg: Swelling present.     Left upper leg: Swelling present.     Right lower leg: Swelling present.     Left lower leg: Swelling present.  Skin:    General: Skin is warm.     Comments: Lower leg covered with bandages.  Right lower leg area covered with a dressing.  Neurological:     Mental Status: She is alert and oriented to person,  place, and time.     Data Reviewed: Basic Metabolic Panel: Recent Labs  Lab 09/06/20 1818 09/07/20 0535 09/08/20 0515 09/09/20 0422 09/11/20 0556  NA 140 140 141 139 141  K 3.9 4.0 3.5 3.7 3.1*  CL 105 107 109 102 101  CO2 25 25 29 30 30   GLUCOSE 129* 152* 104* 163* 110*  BUN 15 13 9  7* 8  CREATININE 0.85 0.79 0.66 0.61 0.55  CALCIUM 8.9 8.4* 7.9* 8.1* 8.0*  MG  --   --  1.7 1.7 1.9  PHOS  --   --  2.7 2.7  --    Liver Function Tests: Recent Labs  Lab 09/06/20 1818 09/08/20 0515 09/09/20 0422  AST 26 34 24  ALT 11 15 12   ALKPHOS 53 41 45  BILITOT 0.9 0.6 0.6  PROT 7.5 5.5* 6.1*  ALBUMIN 3.3* 2.4* 2.6*   CBC: Recent Labs  Lab 09/06/20 1818 09/07/20 0535 09/08/20 0515 09/09/20 0422 09/10/20 0731 09/11/20 0556  WBC 16.2* 11.6* 9.1 12.3* 13.6* 11.7*  NEUTROABS 14.0*  --  6.0 9.8* 10.5*  --   HGB 10.8* 10.2* 9.1* 10.4* 11.4* 10.7*  HCT 34.9* 32.1* 27.9* 33.0* 36.0 34.3*  MCV 84.1 81.9 81.6 82.7 82.6 82.7  PLT 284 267 244 255 239  257    BNP (last 3 results) Recent Labs    12/07/19 1337 12/11/19 0535  BNP 152.7* 35.6     CBG: Recent Labs  Lab 09/10/20 0809 09/10/20 1135 09/10/20 1659 09/10/20 2106 09/11/20 0736  GLUCAP 96 179* 179* 172* 122*    Recent Results (from the past 240 hour(s))  Blood Culture (routine x 2)     Status: None (Preliminary result)   Collection Time: 09/06/20  6:18 PM   Specimen: BLOOD  Result Value Ref Range Status   Specimen Description BLOOD RIGHT ANTECUBITAL  Final   Special Requests   Final    BOTTLES DRAWN AEROBIC AND ANAEROBIC Blood Culture adequate volume   Culture   Final    NO GROWTH 4 DAYS Performed at [facility], [address] [place], [place] [address]    Report Status PENDING  Incomplete  Blood Culture (routine x 2)     Status: None (Preliminary result)   Collection Time: 09/06/20  6:18 PM   Specimen: BLOOD  Result Value Ref Range Status   Specimen Description BLOOD BLOOD LEFT WRIST   Final   Special Requests   Final    BOTTLES DRAWN AEROBIC AND ANAEROBIC Blood Culture adequate volume   Culture   Final    NO GROWTH 4 DAYS Performed at [facility], [address], [place], [place] [address]    Report Status PENDING  Incomplete  Resp Panel by RT-PCR (Flu A&B, Covid) Nasopharyngeal Swab     Status: None   Collection Time: 09/07/20  1:37 AM   Specimen: Nasopharyngeal Swab; Nasopharyngeal(NP) swabs in vial transport medium  Result Value Ref Range Status   SARS Coronavirus 2 by RT PCR NEGATIVE NEGATIVE Final    Comment: (NOTE) SARS-CoV-2 target nucleic acids are NOT DETECTED.  The SARS-CoV-2 RNA is generally detectable in upper respiratory specimens during the acute phase of infection. The lowest concentration of SARS-CoV-2 viral copies this assay can detect is 138 copies/mL. A negative result does not preclude SARS-Cov-2 infection and should not be used as the sole basis for treatment or other patient management decisions. A negative result may occur with  improper specimen collection/handling, submission of specimen other than nasopharyngeal swab, presence of viral mutation(s) within the areas targeted by this assay, and inadequate number of viral copies(<138 copies/mL). A negative result must be combined with clinical observations, patient history, and epidemiological information. The expected result is Negative.  Fact Sheet for Patients:  BloggerCourse.com  Fact Sheet for Healthcare Providers:  SeriousBroker.it  This test is no t yet approved or cleared by the [place] FDA and  has been authorized for detection and/or diagnosis of SARS-CoV-2 by FDA under an Emergency Use Authorization (EUA). This EUA will remain  in effect (meaning this test can be used) for the duration of the COVID-19 declaration under Section 564(b)(1) of the Act, 21 U.S.C.section 360bbb-3(b)(1), unless the authorization is  terminated  or revoked sooner.       Influenza A by PCR NEGATIVE NEGATIVE Final   Influenza B by PCR NEGATIVE NEGATIVE Final    Comment: (NOTE) The Xpert Xpress SARS-CoV-2/FLU/RSV plus assay is intended as an aid in the diagnosis of influenza from Nasopharyngeal swab specimens and should not be used as a sole basis for treatment. Nasal washings and aspirates are unacceptable for Xpert Xpress SARS-CoV-2/FLU/RSV testing.  Fact Sheet for Patients: BloggerCourse.com  Fact Sheet for Healthcare Providers: SeriousBroker.it  This test is not yet approved or cleared by the [place] FDA and has been authorized for detection and/or diagnosis of SARS-CoV-2 by FDA under an Emergency Use Authorization (EUA). This EUA will remain in effect (meaning this test can be used) for the duration of the COVID-19 declaration under Section 564(b)(1) of the Act, 21 U.S.C. section 360bbb-3(b)(1), unless the authorization is terminated or revoked.  Performed at [facility], [address] [place], [place] [address]   Urine culture     Status: Abnormal   Collection Time: 09/07/20  6:38 AM   Specimen: In/Out Cath Urine  Result Value Ref Range Status   Specimen Description   Final    IN/OUT CATH URINE Performed at [facility], [address] [place], [place] [address]    Special Requests   Final    NONE Performed at [facility], [address] [place], [place] [address]    Culture (A)  Final    20,000 COLONIES/mL STAPHYLOCOCCUS AUREUS 80,000 COLONIES/mL DIPHTHEROIDS(CORYNEBACTERIUM SPECIES) Standardized susceptibility testing for this organism is not available. Performed at [facility], [address] [place], [place] [address]    Report Status 09/09/2020 FINAL  Final   Organism ID, Bacteria STAPHYLOCOCCUS AUREUS (A)  Final      Susceptibility   Staphylococcus aureus - MIC*    CIPROFLOXACIN <=0.5  SENSITIVE Sensitive     GENTAMICIN <=0.5 SENSITIVE Sensitive     NITROFURANTOIN 32 SENSITIVE Sensitive     OXACILLIN 0.5 SENSITIVE Sensitive     TETRACYCLINE <=1 SENSITIVE Sensitive     VANCOMYCIN 1 SENSITIVE Sensitive     TRIMETH/SULFA <=10 SENSITIVE Sensitive     CLINDAMYCIN <=0.25 SENSITIVE Sensitive     RIFAMPIN <=0.5 SENSITIVE Sensitive     Inducible Clindamycin NEGATIVE Sensitive     * 20,000 COLONIES/mL STAPHYLOCOCCUS AUREUS     Scheduled Meds:  atorvastatin  20 mg Oral Daily   carvedilol  25 mg Oral BID WC   dorzolamide-timolol  1 drop Both Eyes BID   enoxaparin (LOVENOX) injection  0.5 mg/kg Subcutaneous Q24H   furosemide  40 mg Oral Daily   hydrALAZINE  100 mg Oral Q8H   hydrocerin   Topical Daily   insulin aspart  0-9 Units Subcutaneous TID AC & HS   insulin glargine  20 Units Subcutaneous BID   levETIRAcetam  750 mg Oral BID   linagliptin  5 mg Oral Daily   sacubitril-valsartan  1 tablet Oral BID   Continuous Infusions:  sodium chloride 250 mL (09/09/20 0531)    ceFAZolin (ANCEF) IV 2 g (09/11/20 0548)   metronidazole 500 mg (09/11/20 0551)    Assessment/Plan:  Bilateral lower extremity cellulitis and lymphedema.  The patient initially was started on Rocephin and vancomycin and then antibiotics were switched over to Ancef alone.  Patient spiked a temperature on 09/09/2020.  I consulted infectious disease and Flagyl was added.  No temperature so for today.  We will watch temperature curve today and potentially get out to rehab over the weekend. Essential hypertension.  I increase the Entresto dose and switch Toprol over to Coreg.  Continue Lasix and hydralazine.  Blood pressure better today.  Patient has a pacemaker so I am okay on continuing the Coreg. Type 2 diabetes mellitus with hyperlipidemia.  Continue glargine insulin and linagliptin.  Patient also on aspart insulin before meals Chronic diastolic congestive heart failure.  Patient on Entresto, Toprol and  Lasix Seizure disorder on Keppra Weakness.  Physical therapy recommended rehab.    Code Status:  Code Status Orders  (From admission, onward)           Start     Ordered   09/07/20 0125  Full code  Continuous        09/07/20 0136           Code Status History     Date Active Date Inactive Code Status Order ID Comments User Context   12/07/2019 1742 12/15/2019 0138 Full Code [id number]  Norins, Rosalyn Gess, MD ED      Family Communication: Spoke with roommate Katrina on the phone Disposition Plan: Status is: Inpatient  Dispo: The patient is from: Home              Anticipated d/c is to: Rehab              Patient currently watching temperature curve today.  If no further fever hopefully will be able to get out to rehab tomorrow.   Difficult to place patient.  No.  Consultants: Infectious disease  Antibiotics: Ancef Flagyl  Time spent: 27 minutes  [employer]

## 2020-09-11 NOTE — TOC Progression Note (Signed)
Transition of Care (TOC) - Progression Note    Patient Details  Name: Grace Short MRN: [id number] Date of Birth: May 27, 1945  Transition of Care [facility]) CM/SW Contact  Barrie Dunker, RN Phone Number: 09/11/2020, 9:01 AM  Clinical Narrative:     Received notification from [facility] health with approval to go to [facility], Auth number [id number], start date 7/8 next review 7/12  Expected Discharge Plan: Home/Self Care Barriers to Discharge: Barriers Resolved  Expected Discharge Plan and Services Expected Discharge Plan: Home/Self Care   Discharge Planning Services: CM Consult   Living arrangements for the past 2 months: Single Family Home                 DME Arranged: N/A DME Agency: [employer]                   Social Determinants of Health (SDOH) Interventions    Readmission Risk Interventions No flowsheet data found.

## 2020-09-11 NOTE — Progress Notes (Signed)
Physical Therapy Treatment Patient Details Name: Grace Short. Fullilove MRN: [id number] DOB: [birth year] Today's Date: 09/11/2020    History of Present Illness Grace Short is a 75 y.o. female with medical history significant for morbid obesity, chronic lymphedema, HFpEF, COPD, CVA, type diabetes mellitus, hypertension and dyslipidemia, who presented to the ER with acute onset of bilateral lower extremity worsening swelling with erythema, warmth and tenderness with bruising.  She denies any fever or chills.  No chest pain or dyspnea or cough or wheezing hemoptysis.  No dysuria, oliguria or hematuria or flank pain.    PT Comments    Pt seen for PT tx with pt reporting she needs to be cleaned up. Pt requires total assist +2 for rolling L<>R as pt with limited ability to turn/rotate to grab bed rails 2/2 body habitus. Pt requires dependent assist for peri hygiene but is able to hold to bed rail to assist with maintaining sidelying position. Once clean, pt declines further participation/performing BLE exercises, reporting she will do more tomorrow. Pt left in care of nursing staff.     Follow Up Recommendations  SNF     Equipment Recommendations   (TBD in next venue)    Recommendations for Other Services       Precautions / Restrictions Precautions Precautions: Fall Restrictions Weight Bearing Restrictions: No    Mobility  Bed Mobility Overal bed mobility: Needs Assistance Bed Mobility: Rolling Rolling: Total assist;+2 for physical assistance              Transfers                    Ambulation/Gait                 Stairs             Wheelchair Mobility    Modified Rankin (Stroke Patients Only)       Balance                                            Cognition Arousal/Alertness: Awake/alert Behavior During Therapy: WFL for tasks assessed/performed;Flat affect                                          Exercises       General Comments        Pertinent Vitals/Pain Pain Assessment: Faces Faces Pain Scale: Hurts a little bit Pain Location: B LEs Pain Descriptors / Indicators: Sore;Discomfort Pain Intervention(s): Repositioned;Limited activity within patient's tolerance    Home Living                      Prior Function            PT Goals (current goals can now be found in the care plan section) Acute Rehab PT Goals Patient Stated Goal: to go home PT Goal Formulation: With patient Time For Goal Achievement: 09/23/20 Potential to Achieve Goals: Poor Progress towards PT goals: Progressing toward goals    Frequency    Min 2X/week      PT Plan Current plan remains appropriate    Co-evaluation              AM-PAC PT "6 Clicks" Mobility   Outcome Measure  Help  needed turning from your back to your side while in a flat bed without using bedrails?: Total Help needed moving from lying on your back to sitting on the side of a flat bed without using bedrails?: Total Help needed moving to and from a bed to a chair (including a wheelchair)?: Total Help needed standing up from a chair using your arms (e.g., wheelchair or bedside chair)?: Total Help needed to walk in hospital room?: Total Help needed climbing 3-5 steps with a railing? : Total 6 Click Score: 6    End of Session   Activity Tolerance:  (pt self limiting) Patient left: in bed;with call bell/phone within reach;with nursing/sitter in room   PT Visit Diagnosis: Muscle weakness (generalized) (M62.81);Other abnormalities of gait and mobility (R26.89);Difficulty in walking, not elsewhere classified (R26.2);Pain Pain - Right/Left:  (BLE) Pain - part of body: Ankle and joints of foot;Leg     Time: 6720-9470 PT Time Calculation (min) (ACUTE ONLY): 16 min  Charges:  $Therapeutic Activity: 8-22 mins                     Aleda Grana, PT, DPT 09/11/20, 2:10 PM    Sandi Mariscal 09/11/2020, 2:09 PM

## 2020-09-12 DIAGNOSIS — Z6841 Body Mass Index (BMI) 40.0 and over, adult: Secondary | ICD-10-CM

## 2020-09-12 LAB — CULTURE, BLOOD (ROUTINE X 2)
Culture: NO GROWTH
Culture: NO GROWTH
Special Requests: ADEQUATE
Special Requests: ADEQUATE

## 2020-09-12 LAB — BASIC METABOLIC PANEL
Anion gap: 5 (ref 5–15)
BUN: 12 mg/dL (ref 8–23)
CO2: 32 mmol/L (ref 22–32)
Calcium: 8.1 mg/dL — ABNORMAL LOW (ref 8.9–10.3)
Chloride: 103 mmol/L (ref 98–111)
Creatinine, Ser: 0.53 mg/dL (ref 0.44–1.00)
GFR, Estimated: >60 mL/min (ref >60)
Glucose, Bld: 128 mg/dL — ABNORMAL HIGH (ref 70–99)
Potassium: 3.3 mmol/L — ABNORMAL LOW (ref 3.5–5.1)
Sodium: 140 mmol/L (ref 135–145)

## 2020-09-12 LAB — RESP PANEL BY RT-PCR (FLU A&B, COVID) ARPGX2
Influenza A by PCR: NEGATIVE
Influenza B by PCR: NEGATIVE
SARS Coronavirus 2 by RT PCR: NEGATIVE

## 2020-09-12 LAB — MAGNESIUM: Magnesium: 1.9 mg/dL (ref 1.7–2.4)

## 2020-09-12 LAB — GLUCOSE, CAPILLARY
Glucose-Capillary: 130 mg/dL — ABNORMAL HIGH (ref 70–99)
Glucose-Capillary: 148 mg/dL — ABNORMAL HIGH (ref 70–99)
Glucose-Capillary: 138 mg/dL — ABNORMAL HIGH (ref 70–99)
Glucose-Capillary: 138 mg/dL — ABNORMAL HIGH (ref 70–99)

## 2020-09-12 MED ORDER — INSULIN LISPRO 100 UNIT/ML ~~LOC~~ SOLN
6.0000 [IU] | Freq: Three times a day (TID) | SUBCUTANEOUS | 11 refills | Status: DC
Start: 2020-09-12 — End: 2022-05-20

## 2020-09-12 MED ORDER — SACUBITRIL-VALSARTAN 97-103 MG PO TABS
1.0000 | ORAL_TABLET | Freq: Two times a day (BID) | ORAL | 0 refills | Status: DC
Start: 2020-09-12 — End: 2022-05-20

## 2020-09-12 MED ORDER — CARVEDILOL 12.5 MG PO TABS: 12.5000 mg | ORAL_TABLET | Freq: Two times a day (BID) | ORAL | 0 refills | Status: DC

## 2020-09-12 MED ORDER — METRONIDAZOLE 500 MG PO TABS: 500.0000 mg | ORAL_TABLET | Freq: Two times a day (BID) | ORAL | 0 refills | Status: DC

## 2020-09-12 MED ORDER — CEPHALEXIN 500 MG PO CAPS
500.0000 mg | ORAL_CAPSULE | Freq: Four times a day (QID) | ORAL | Status: DC
  Administered 2020-09-12 – 2020-09-15 (×13): 500 mg via ORAL
  Filled 2020-09-12 (×13): qty 1

## 2020-09-12 MED ORDER — CEPHALEXIN 500 MG PO CAPS: 500.0000 mg | ORAL_CAPSULE | Freq: Four times a day (QID) | ORAL | 0 refills | Status: DC

## 2020-09-12 MED ORDER — POTASSIUM CHLORIDE CRYS ER 20 MEQ PO TBCR
40.0000 meq | EXTENDED_RELEASE_TABLET | Freq: Every day | ORAL | Status: DC
  Administered 2020-09-12 – 2020-09-13 (×2): 40 meq via ORAL
  Filled 2020-09-12 (×2): qty 2

## 2020-09-12 MED ORDER — TRAMADOL HCL 50 MG PO TABS: 50.0000 mg | ORAL_TABLET | Freq: Three times a day (TID) | ORAL | 0 refills | Status: AC | PRN

## 2020-09-12 MED ORDER — METRONIDAZOLE 500 MG PO TABS
500.0000 mg | ORAL_TABLET | Freq: Two times a day (BID) | ORAL | Status: DC
  Administered 2020-09-12 – 2020-09-15 (×6): 500 mg via ORAL
  Filled 2020-09-12 (×7): qty 1

## 2020-09-12 MED ORDER — FLUCONAZOLE 100 MG PO TABS: 100.0000 mg | ORAL_TABLET | Freq: Every day | ORAL | 0 refills | Status: DC

## 2020-09-12 MED ORDER — HYDROCERIN EX CREA: 1.0000 "application " | TOPICAL_CREAM | Freq: Every day | CUTANEOUS | 0 refills | Status: DC

## 2020-09-12 NOTE — Discharge Summary (Signed)
[facility] [place] at [facility]   PATIENT NAME: Grace Short    MR#:  [id number]  DATE OF BIRTH:  Oct 27, 1945  DATE OF ADMISSION:  09/06/2020 ADMITTING PHYSICIAN: Hannah Beat, MD  DATE OF DISCHARGE: 09/12/2020  PRIMARY CARE PHYSICIAN: Shane Crutch, PA    ADMISSION DIAGNOSIS:  Lower extremity cellulitis [L03.119]  DISCHARGE DIAGNOSIS:  Active Problems:   Chronic diastolic CHF (congestive heart failure) (HCC)   Lymphedema   Seizure (HCC)   Essential hypertension   Cellulitis of both lower extremities   Hypertensive urgency   SECONDARY DIAGNOSIS:   Past Medical History:  Diagnosis Date  . Chronic acquired lymphedema   . Chronic heart failure with preserved ejection fraction (HFpEF) (HCC)   . COPD (chronic obstructive pulmonary disease) (HCC)   . CVA (cerebral vascular accident) (HCC)   . DM (diabetes mellitus) (HCC)   . HLD (hyperlipidemia)   . Hypertension   . Morbid obesity with BMI of 50.0-59.9, adult (HCC)   . Partial seizure disorder (HCC)   . SSS (sick sinus syndrome) (HCC)     HOSPITAL COURSE:   1.  Bilateral lower extremity cellulitis and lymphedema.  Patient was initially started on Rocephin and vancomycin and antibiotics were switched over to Ancef alone.  The patient spiked a temperature on 09/09/2020.  Infectious disease consultation was obtained and Flagyl was added to the Ancef along with Diflucan.  The patient remained afebrile and is stable to go out to rehab facility.  Keflex 500 mg every 6 hours prescribed, Flagyl 500 mg every 12 prescribed.  Both these medications will end on 7/15.  Diflucan 100 mg p.o. daily through 7/12.  Wound care bilateral lower extremities: Wash legs thoroughly with soap and water with attention to clean gently and skin folds and between toes.  Pat dry.  Apply Eucerin cream to extremities except in skin folds and between toes.  Cover area as of weeping with Xeroform gauze [place] [phone]).  Wrap from just below the  toes to just below the knees with Kerlix roll gauze and top with 6 inch Ace bandage.  Place feet in Prevalon boots. 2.  Accelerated hypertension on admission now with essential hypertension.  I increase the Entresto dose to maximum dose and switched the Toprol over to Coreg.  Continue Lasix.  Since blood pressure did come down with this I got rid of hydralazine.  The patient does have a pacemaker so if you need to go up on the Coreg you can.  No holding parameters on the Coreg with regards to heart rate. 3.  Type 2 diabetes mellitus with hyperlipidemia.  Continue glargine insulin and linagliptin and aspart insulin before meals 4.  Chronic diastolic congestive heart failure on Entresto, Coreg and Lasix 5.  Seizure disorder on Keppra 6.  Weakness.  Physical therapy recommends rehab 7.  Morbid obesity with a BMI of 60.12  DISCHARGE CONDITIONS:   Satisfactory  CONSULTS OBTAINED:  Treatment Team:  Lynn Ito, MD  DRUG ALLERGIES:   Allergies  Allergen Reactions  . Ativan [Lorazepam]     DISCHARGE MEDICATIONS:   Allergies as of 09/12/2020       Reactions   Ativan [lorazepam]         Medication List     STOP taking these medications    Entresto 49-51 MG Generic drug: sacubitril-valsartan Replaced by: sacubitril-valsartan 97-103 MG   guaiFENesin-dextromethorphan 100-10 MG/5ML syrup Commonly known as: ROBITUSSIN DM   hydrALAZINE 50 MG tablet Commonly known as: APRESOLINE  metoprolol succinate 25 MG 24 hr tablet Commonly known as: TOPROL-XL   predniSONE 10 MG tablet Commonly known as: DELTASONE       TAKE these medications    acetaminophen 325 MG tablet Commonly known as: TYLENOL Take 650 mg by mouth 2 (two) times daily as needed.   albuterol 108 (90 Base) MCG/ACT inhaler Commonly known as: VENTOLIN HFA Inhale 2 puffs into the lungs once as needed for wheezing or shortness of breath (for Grade 3 or 4 hypersensitivity reaction).   atorvastatin 20 MG  tablet Commonly known as: LIPITOR Take 20 mg by mouth daily.   carvedilol 12.5 MG tablet Commonly known as: COREG Take 1 tablet (12.5 mg total) by mouth 2 (two) times daily with a meal.   cephALEXin 500 MG capsule Commonly known as: KEFLEX Take 1 capsule (500 mg total) by mouth every 6 (six) hours for 6 days.   cholecalciferol 25 MCG (1000 UNIT) tablet Commonly known as: VITAMIN D3 Take 1,000 Units by mouth every other day.   dorzolamide-timolol 22.3-6.8 MG/ML ophthalmic solution Commonly known as: COSOPT Place 1 drop into both eyes 2 (two) times daily.   fluconazole 100 MG tablet Commonly known as: DIFLUCAN Take 1 tablet (100 mg total) by mouth daily for 3 days. Start taking on: September 13, 2020   furosemide 40 MG tablet Commonly known as: LASIX Take 1 tablet (40 mg total) by mouth daily.   hydrocerin Crea Apply 1 application topically daily. Start taking on: September 13, 2020   insulin glargine 100 UNIT/ML injection Commonly known as: LANTUS Inject 20 Units into the skin 2 (two) times daily.   insulin lispro 100 UNIT/ML injection Commonly known as: HUMALOG Inject 0.06 mLs (6 Units total) into the skin 3 (three) times daily with meals. Inject 10u under the skin three times daily at mealtimes (plus applicable sliding scale) - do not use if blood glucose <150 What changed:  how much to take when to take this   levETIRAcetam 750 MG tablet Commonly known as: KEPPRA Take 750 mg by mouth 2 (two) times daily.   linagliptin 5 MG Tabs tablet Commonly known as: TRADJENTA Take 5 mg by mouth daily.   loratadine 10 MG tablet Commonly known as: CLARITIN Take 10 mg by mouth daily as needed for allergies.   metroNIDAZOLE 500 MG tablet Commonly known as: FLAGYL Take 1 tablet (500 mg total) by mouth every 12 (twelve) hours for 6 days.   nystatin powder Commonly known as: MYCOSTATIN/NYSTOP Apply 1 application topically daily as needed.   potassium chloride SA 20 MEQ  tablet Commonly known as: KLOR-CON Take 2 tablets (40 mEq total) by mouth daily.   sacubitril-valsartan 97-103 MG Commonly known as: ENTRESTO Take 1 tablet by mouth 2 (two) times daily. Replaces: Entresto 49-51 MG   traMADol 50 MG tablet Commonly known as: ULTRAM Take 1 tablet (50 mg total) by mouth every 8 (eight) hours as needed for up to 3 days. What changed: when to take this   vitamin B-12 100 MCG tablet Commonly known as: CYANOCOBALAMIN Take 100 mcg by mouth every other day.   vitamin C 250 MG tablet Commonly known as: ASCORBIC ACID Take 250 mg by mouth every other day.         DISCHARGE INSTRUCTIONS:   Follow-up with team at rehab 1 day Will need lymphedema clinic upon discharge from rehab Will need wound care clinic if wounds are not healed after discharge from rehab  If you experience worsening of your  admission symptoms, develop shortness of breath, life threatening emergency, suicidal or homicidal thoughts you must seek medical attention immediately by calling 911 or calling your MD immediately  if symptoms less severe.  You Must read complete instructions/literature along with all the possible adverse reactions/side effects for all the Medicines you take and that have been prescribed to you. Take any new Medicines after you have completely understood and accept all the possible adverse reactions/side effects.   Please note  You were cared for by a hospitalist during your hospital stay. If you have any questions about your discharge medications or the care you received while you were in the hospital after you are discharged, you can call the unit and asked to speak with the hospitalist on call if the hospitalist that took care of you is not available. Once you are discharged, your primary care physician will handle any further medical issues. Please note that NO REFILLS for any discharge medications will be authorized once you are discharged, as it is imperative that  you return to your primary care physician (or establish a relationship with a primary care physician if you do not have one) for your aftercare needs so that they can reassess your need for medications and monitor your lab values.    Today   CHIEF COMPLAINT:   Chief Complaint  Patient presents with  . Leg Swelling    HISTORY OF PRESENT ILLNESS:  Grace Short  is a 75 y.o. female came in with leg swelling   VITAL SIGNS:  Blood pressure (!) 159/58, pulse 68, temperature 98.7 F (37.1 C), resp. rate (!) 21, height  (1.575 m), weight (!) 149.1 kg, SpO2 94 %.  I/O:   Intake/Output Summary (Last 24 hours) at 09/12/2020 0921 Last data filed at 09/11/2020 1859 Gross per 24 hour  Intake 320 ml  Output --  Net 320 ml    PHYSICAL EXAMINATION:  GENERAL:  75 y.o.-year-old patient lying in the bed with no acute distress.  EYES: Pupils equal, round, reactive to light and accommodation. No scleral icterus. HEENT: Head atraumatic, normocephalic. Oropharynx and nasopharynx clear.   LUNGS: Normal breath sounds bilaterally, no wheezing, rales,rhonchi or crepitation. No use of accessory muscles of respiration.  CARDIOVASCULAR: S1, S2 normal. No murmurs, rubs, or gallops.  ABDOMEN: Soft, non-tender, non-distended.  EXTREMITIES: Bilateral leg swelling.  NEUROLOGIC: Cranial nerves II through XII are intact. Muscle strength 5/5 in all extremities. Sensation intact. Gait not checked.  PSYCHIATRIC: The patient is alert and oriented x 3.  SKIN: Chronic lower extremity skin discoloration         DATA REVIEW:   CBC Recent Labs  Lab 09/11/20 0556  WBC 11.7*  HGB 10.7*  HCT 34.3*  PLT 257    Chemistries  Recent Labs  Lab 09/09/20 0422 09/11/20 0556 09/12/20 0422  NA 139   < > 140  K 3.7   < > 3.3*  CL 102   < > 103  CO2 30   < > 32  GLUCOSE 163*   < > 128*  BUN 7*   < > 12  CREATININE 0.61   < > 0.53  CALCIUM 8.1*   < > 8.1*  MG 1.7   < > 1.9  AST 24  --   --   ALT 12  --    --   ALKPHOS 45  --   --   BILITOT 0.6  --   --    < > = values  in this interval not displayed.     Microbiology Results  Results for orders placed or performed during the hospital encounter of 09/06/20  Blood Culture (routine x 2)     Status: None   Collection Time: 09/06/20  6:18 PM   Specimen: BLOOD  Result Value Ref Range Status   Specimen Description BLOOD RIGHT ANTECUBITAL  Final   Special Requests   Final    BOTTLES DRAWN AEROBIC AND ANAEROBIC Blood Culture adequate volume   Culture   Final    NO GROWTH 5 DAYS Performed at [facility], [address] [place], [place] [address]    Report Status 09/12/2020 FINAL  Final  Blood Culture (routine x 2)     Status: None   Collection Time: 09/06/20  6:18 PM   Specimen: BLOOD  Result Value Ref Range Status   Specimen Description BLOOD BLOOD LEFT WRIST  Final   Special Requests   Final    BOTTLES DRAWN AEROBIC AND ANAEROBIC Blood Culture adequate volume   Culture   Final    NO GROWTH 5 DAYS Performed at [facility], [address] [place], [place] [address]    Report Status 09/12/2020 FINAL  Final  Resp Panel by RT-PCR (Flu A&B, Covid) Nasopharyngeal Swab     Status: None   Collection Time: 09/07/20  1:37 AM   Specimen: Nasopharyngeal Swab; Nasopharyngeal(NP) swabs in vial transport medium  Result Value Ref Range Status   SARS Coronavirus 2 by RT PCR NEGATIVE NEGATIVE Final    Comment: (NOTE) SARS-CoV-2 target nucleic acids are NOT DETECTED.  The SARS-CoV-2 RNA is generally detectable in upper respiratory specimens during the acute phase of infection. The lowest concentration of SARS-CoV-2 viral copies this assay can detect is 138 copies/mL. A negative result does not preclude SARS-Cov-2 infection and should not be used as the sole basis for treatment or other patient management decisions. A negative result may occur with  improper specimen collection/handling, submission of specimen other than  nasopharyngeal swab, presence of viral mutation(s) within the areas targeted by this assay, and inadequate number of viral copies(<138 copies/mL). A negative result must be combined with clinical observations, patient history, and epidemiological information. The expected result is Negative.  Fact Sheet for Patients:  BloggerCourse.com  Fact Sheet for Healthcare Providers:  SeriousBroker.it  This test is no t yet approved or cleared by the [place] FDA and  has been authorized for detection and/or diagnosis of SARS-CoV-2 by FDA under an Emergency Use Authorization (EUA). This EUA will remain  in effect (meaning this test can be used) for the duration of the COVID-19 declaration under Section 564(b)(1) of the Act, 21 U.S.C.section 360bbb-3(b)(1), unless the authorization is terminated  or revoked sooner.       Influenza A by PCR NEGATIVE NEGATIVE Final   Influenza B by PCR NEGATIVE NEGATIVE Final    Comment: (NOTE) The Xpert Xpress SARS-CoV-2/FLU/RSV plus assay is intended as an aid in the diagnosis of influenza from Nasopharyngeal swab specimens and should not be used as a sole basis for treatment. Nasal washings and aspirates are unacceptable for Xpert Xpress SARS-CoV-2/FLU/RSV testing.  Fact Sheet for Patients: BloggerCourse.com  Fact Sheet for Healthcare Providers: SeriousBroker.it  This test is not yet approved or cleared by the [place] FDA and has been authorized for detection and/or diagnosis of SARS-CoV-2 by FDA under an Emergency Use Authorization (EUA). This EUA will remain in effect (meaning this test can be used) for the duration of  the COVID-19 declaration under Section 564(b)(1) of the Act, 21 U.S.C. section 360bbb-3(b)(1), unless the authorization is terminated or revoked.  Performed at [facility], [address], [place], [place]  [address]   Urine culture     Status: Abnormal   Collection Time: 09/07/20  6:38 AM   Specimen: In/Out Cath Urine  Result Value Ref Range Status   Specimen Description   Final    IN/OUT CATH URINE Performed at [facility], [address] [place], [place] [address]    Special Requests   Final    NONE Performed at [facility], [address] [place], [place] [address]    Culture (A)  Final    20,000 COLONIES/mL STAPHYLOCOCCUS AUREUS 80,000 COLONIES/mL DIPHTHEROIDS(CORYNEBACTERIUM SPECIES) Standardized susceptibility testing for this organism is not available. Performed at [facility], [address], [place], [place] [address]    Report Status 09/09/2020 FINAL  Final   Organism ID, Bacteria STAPHYLOCOCCUS AUREUS (A)  Final      Susceptibility   Staphylococcus aureus - MIC*    CIPROFLOXACIN <=0.5 SENSITIVE Sensitive     GENTAMICIN <=0.5 SENSITIVE Sensitive     NITROFURANTOIN 32 SENSITIVE Sensitive     OXACILLIN 0.5 SENSITIVE Sensitive     TETRACYCLINE <=1 SENSITIVE Sensitive     VANCOMYCIN 1 SENSITIVE Sensitive     TRIMETH/SULFA <=10 SENSITIVE Sensitive     CLINDAMYCIN <=0.25 SENSITIVE Sensitive     RIFAMPIN <=0.5 SENSITIVE Sensitive     Inducible Clindamycin NEGATIVE Sensitive     * 20,000 COLONIES/mL STAPHYLOCOCCUS AUREUS    Management plans discussed with the patient, friend and they are in agreement.  CODE STATUS:     Code Status Orders  (From admission, onward)           Start     Ordered   09/07/20 0125  Full code  Continuous        09/07/20 0136           Code Status History     Date Active Date Inactive Code Status Order ID Comments User Context   12/07/2019 1742 12/15/2019 0138 Full Code [id number]  Jacques Navy, MD ED       TOTAL TIME TAKING CARE OF THIS PATIENT: 32 minutes.    Alford Highland M.D on 09/12/2020 at 9:21 AM  Between 7am to 6pm - Pager - [phone]  After 6pm go to [email or web address] - password [email or web address]  [facility]  [facility]  CC: Primary care physician; Shane Crutch, PA

## 2020-09-12 NOTE — TOC Progression Note (Addendum)
Transition of Care (TOC) - Progression Note    Patient Details  Name: Marieta Markov. Zaun MRN: [id number] Date of Birth: [birth year]  Transition of Care [facility]) CM/SW Contact  Bing Quarry, RN Phone Number: 09/12/2020, 9:20 AM  Clinical Narrative:    Redmond Baseman at [facility] at [phone] to check if can accept patient today if discharged. Facility can accept patient this weekend if discharged.  DC Orders noted after calling facility.  DC Summary should be faxed to [phone] AND sent via [facility] per [place] as no one in office to receive fax copy till Monday. Gabriel Cirri RN CM    Expected Discharge Plan: Home/Self Care Barriers to Discharge: Barriers Resolved  Expected Discharge Plan and Services Expected Discharge Plan: Home/Self Care   Discharge Planning Services: CM Consult   Living arrangements for the past 2 months: Single Family Home Expected Discharge Date: 09/12/20               DME Arranged: N/A DME Agency: [employer]                   Social Determinants of Health (SDOH) Interventions    Readmission Risk Interventions No flowsheet data found.

## 2020-09-12 NOTE — Progress Notes (Addendum)
Patient ID: Grace Short, female   DOB: 1945/11/21, 75 y.o.   MRN: [id number]  Canceled discharge.  Facility does not have a bariatric bed for today.  Likely will be here until bed obtained.  Dr Renae Gloss

## 2020-09-12 NOTE — TOC Transition Note (Addendum)
Transition of Care (TOC) - CM/SW Discharge Note   Patient Details  Name: Cady Hafen. Fahr MRN: [id number] Date of Birth: 10-Jan-1946  Transition [facility] Care [facility]) CM/SW Contact:  Bing Quarry, RN Phone Number: 09/12/2020, 9:34 AM   Clinical Narrative:    Discharge orders in, awaiting DC Summary.  [facility] Disposition confirmed with Lajean Manes.  Communicated with unit RN. Bariatric patient for transport.  Gabriel Cirri RN CM   DC Summary sent to inbasket. Then it was discovered patient is on bariatric bed. Confirmed need with provider and communicated to facility. DC canceled. [facility] will order asap Monday. Gabriel Cirri RN CM   Final next level of care: Skilled Nursing Facility Barriers to Discharge: Barriers Resolved   Patient Goals and CMS Choice Patient states their goals for this hospitalization and ongoing recovery are:: go home      Discharge Placement              Patient [facility] bed at: [facility] Patient to be transferred to facility by: [facility]      Discharge Plan and Services   Discharge Planning Services: CM Consult            DME Arranged: N/A DME Agency:  (Going to SNF)       HH Arranged: NA HH Agency: NA        Social Determinants of Health (SDOH) Interventions     Readmission Risk Interventions No flowsheet data found.

## 2020-09-13 LAB — GLUCOSE, CAPILLARY
Glucose-Capillary: 126 mg/dL — ABNORMAL HIGH (ref 70–99)
Glucose-Capillary: 143 mg/dL — ABNORMAL HIGH (ref 70–99)
Glucose-Capillary: 92 mg/dL (ref 70–99)
Glucose-Capillary: 122 mg/dL — ABNORMAL HIGH (ref 70–99)

## 2020-09-13 MED ORDER — BACID PO TABS
2.0000 | ORAL_TABLET | Freq: Three times a day (TID) | ORAL | Status: DC
  Filled 2020-09-13 (×2): qty 2

## 2020-09-13 MED ORDER — CHOLESTYRAMINE 4 G PO PACK
4.0000 g | PACK | Freq: Two times a day (BID) | ORAL | Status: DC
  Administered 2020-09-13 – 2020-09-15 (×5): 4 g via ORAL
  Filled 2020-09-13 (×6): qty 1

## 2020-09-13 MED ORDER — RISAQUAD PO CAPS
2.0000 | ORAL_CAPSULE | Freq: Three times a day (TID) | ORAL | Status: DC
  Administered 2020-09-13 – 2020-09-15 (×6): 2 via ORAL
  Filled 2020-09-13 (×6): qty 2

## 2020-09-13 MED ORDER — ALBUTEROL SULFATE (2.5 MG/3ML) 0.083% IN NEBU
2.5000 mg | INHALATION_SOLUTION | Freq: Four times a day (QID) | RESPIRATORY_TRACT | Status: DC
  Administered 2020-09-13 – 2020-09-15 (×7): 2.5 mg via RESPIRATORY_TRACT
  Filled 2020-09-13 (×7): qty 3

## 2020-09-13 NOTE — Progress Notes (Signed)
Patient ID: Miquel DunnElly V. Robb MatarOrtiz, female   DOB: May 23, 1945, 75 y.o.   MRN: [id number] [facility] PROGRESS NOTE  Antonisha V. Robb MatarOrtiz XBM:[id number] DOB: May 23, 1945 DOA: 09/06/2020 PCP: Shane CrutchMarkley, Linda, PA  HPI/Subjective: Patient stated she had 5 episodes of diarrhea but I see 3 episodes documented type VI stool.  Patient not on any medications that would cause diarrhea besides the antibiotics.  Objective: Vitals:   09/13/20 0848 09/13/20 1136  BP: (!) 165/58 (!) 142/51  Pulse: 68 64  Resp: 20 20  Temp: 98.3 F (36.8 C) 99 F (37.2 C)  SpO2: 91% 91%    Intake/Output Summary (Last 24 hours) at 09/13/2020 1543 Last data filed at 09/13/2020 1108 Gross per 24 hour  Intake --  Output 600 ml  Net -600 ml   Filed Weights   09/06/20 1814 09/07/20 0400  Weight: 136.1 kg (!) 149.1 kg    ROS: Review of Systems  Respiratory:  Negative for shortness of breath.   Cardiovascular:  Negative for chest pain.  Gastrointestinal:  Positive for diarrhea. Negative for abdominal pain, nausea and vomiting.  Exam: Physical Exam HENT:     Head: Normocephalic.     Mouth/Throat:     Pharynx: No oropharyngeal exudate.  Eyes:     General: Lids are normal.     Conjunctiva/sclera: Conjunctivae normal.  Cardiovascular:     Rate and Rhythm: Normal rate and regular rhythm.     Heart sounds: Normal heart sounds, S1 normal and S2 normal.  Pulmonary:     Breath sounds: Normal breath sounds. No decreased breath sounds, wheezing, rhonchi or rales.  Abdominal:     Palpations: Abdomen is soft.     Tenderness: There is no abdominal tenderness.  Musculoskeletal:     Right lower leg: Swelling present.     Left lower leg: Swelling present.  Skin:    General: Skin is warm.     Comments: Bilateral lower extremities wrapped.  Upper lower extremity not painful to touch and fading erythema.  Neurological:     Mental Status: She is alert and oriented to person, place, and time.     Data Reviewed: Basic Metabolic  Panel: Recent Labs  Lab 09/07/20 0535 09/08/20 0515 09/09/20 0422 09/11/20 0556 09/12/20 0422  NA 140 141 139 141 140  K 4.0 3.5 3.7 3.1* 3.3*  CL 107 109 102 101 103  CO2 25 29 30 30  32  GLUCOSE 152* 104* 163* 110* 128*  BUN 13 9 7* 8 12  CREATININE 0.79 0.66 0.61 0.55 0.53  CALCIUM 8.4* 7.9* 8.1* 8.0* 8.1*  MG  --  1.7 1.7 1.9 1.9  PHOS  --  2.7 2.7  --   --    Liver Function Tests: Recent Labs  Lab 09/06/20 1818 09/08/20 0515 09/09/20 0422  AST 26 34 24  ALT 11 15 12   ALKPHOS 53 41 45  BILITOT 0.9 0.6 0.6  PROT 7.5 5.5* 6.1*  ALBUMIN 3.3* 2.4* 2.6*    CBC: Recent Labs  Lab 09/06/20 1818 09/07/20 0535 09/08/20 0515 09/09/20 0422 09/10/20 0731 09/11/20 0556  WBC 16.2* 11.6* 9.1 12.3* 13.6* 11.7*  NEUTROABS 14.0*  --  6.0 9.8* 10.5*  --   HGB 10.8* 10.2* 9.1* 10.4* 11.4* 10.7*  HCT 34.9* 32.1* 27.9* 33.0* 36.0 34.3*  MCV 84.1 81.9 81.6 82.7 82.6 82.7  PLT 284 267 244 255 239 257   BNP (last 3 results) Recent Labs    12/07/19 1337 12/11/19 0535  BNP 152.7* 35.6  CBG: Recent Labs  Lab 09/12/20 1149 09/12/20 1648 09/12/20 2121 09/13/20 0849 09/13/20 1136  GLUCAP 148* 138* 138* 126* 143*    Recent Results (from the past 240 hour(s))  Blood Culture (routine x 2)     Status: None   Collection Time: 09/06/20  6:18 PM   Specimen: BLOOD  Result Value Ref Range Status   Specimen Description BLOOD RIGHT ANTECUBITAL  Final   Special Requests   Final    BOTTLES DRAWN AEROBIC AND ANAEROBIC Blood Culture adequate volume   Culture   Final    NO GROWTH 5 DAYS Performed at [facility], [address] [place], [place] [address]    Report Status 09/12/2020 FINAL  Final  Blood Culture (routine x 2)     Status: None   Collection Time: 09/06/20  6:18 PM   Specimen: BLOOD  Result Value Ref Range Status   Specimen Description BLOOD BLOOD LEFT WRIST  Final   Special Requests   Final    BOTTLES DRAWN AEROBIC AND ANAEROBIC Blood Culture  adequate volume   Culture   Final    NO GROWTH 5 DAYS Performed at [facility], [address] [place], [place] [address]    Report Status 09/12/2020 FINAL  Final  Resp Panel by RT-PCR (Flu A&B, Covid) Nasopharyngeal Swab     Status: None   Collection Time: 09/07/20  1:37 AM   Specimen: Nasopharyngeal Swab; Nasopharyngeal(NP) swabs in vial transport medium  Result Value Ref Range Status   SARS Coronavirus 2 by RT PCR NEGATIVE NEGATIVE Final    Comment: (NOTE) SARS-CoV-2 target nucleic acids are NOT DETECTED.  The SARS-CoV-2 RNA is generally detectable in upper respiratory specimens during the acute phase of infection. The lowest concentration of SARS-CoV-2 viral copies this assay can detect is 138 copies/mL. A negative result does not preclude SARS-Cov-2 infection and should not be used as the sole basis for treatment or other patient management decisions. A negative result may occur with  improper specimen collection/handling, submission of specimen other than nasopharyngeal swab, presence of viral mutation(s) within the areas targeted by this assay, and inadequate number of viral copies(<138 copies/mL). A negative result must be combined with clinical observations, patient history, and epidemiological information. The expected result is Negative.  Fact Sheet for Patients:  BloggerCourse.com  Fact Sheet for Healthcare Providers:  SeriousBroker.it  This test is no t yet approved or cleared by the [place] FDA and  has been authorized for detection and/or diagnosis of SARS-CoV-2 by FDA under an Emergency Use Authorization (EUA). This EUA will remain  in effect (meaning this test can be used) for the duration of the COVID-19 declaration under Section 564(b)(1) of the Act, 21 U.S.C.section 360bbb-3(b)(1), unless the authorization is terminated  or revoked sooner.       Influenza A by PCR NEGATIVE NEGATIVE Final    Influenza B by PCR NEGATIVE NEGATIVE Final    Comment: (NOTE) The Xpert Xpress SARS-CoV-2/FLU/RSV plus assay is intended as an aid in the diagnosis of influenza from Nasopharyngeal swab specimens and should not be used as a sole basis for treatment. Nasal washings and aspirates are unacceptable for Xpert Xpress SARS-CoV-2/FLU/RSV testing.  Fact Sheet for Patients: BloggerCourse.com  Fact Sheet for Healthcare Providers: SeriousBroker.it  This test is not yet approved or cleared by the [place] FDA and has been authorized for detection and/or diagnosis of SARS-CoV-2 by FDA under an Emergency Use Authorization (EUA). This EUA will remain in effect (meaning  this test can be used) for the duration of the COVID-19 declaration under Section 564(b)(1) of the Act, 21 U.S.C. section 360bbb-3(b)(1), unless the authorization is terminated or revoked.  Performed at [facility], [address], [place], [place] [address]   Urine culture     Status: Abnormal   Collection Time: 09/07/20  6:38 AM   Specimen: In/Out Cath Urine  Result Value Ref Range Status   Specimen Description   Final    IN/OUT CATH URINE Performed at [facility], [address] [place], [place] [address]    Special Requests   Final    NONE Performed at [facility], [address] [place], [place] [address]    Culture (A)  Final    20,000 COLONIES/mL STAPHYLOCOCCUS AUREUS 80,000 COLONIES/mL DIPHTHEROIDS(CORYNEBACTERIUM SPECIES) Standardized susceptibility testing for this organism is not available. Performed at [facility], [address], [place], [place] [address]    Report Status 09/09/2020 FINAL  Final   Organism ID, Bacteria STAPHYLOCOCCUS AUREUS (A)  Final      Susceptibility   Staphylococcus aureus - MIC*    CIPROFLOXACIN <=0.5 SENSITIVE Sensitive     GENTAMICIN <=0.5 SENSITIVE Sensitive     NITROFURANTOIN 32  SENSITIVE Sensitive     OXACILLIN 0.5 SENSITIVE Sensitive     TETRACYCLINE <=1 SENSITIVE Sensitive     VANCOMYCIN 1 SENSITIVE Sensitive     TRIMETH/SULFA <=10 SENSITIVE Sensitive     CLINDAMYCIN <=0.25 SENSITIVE Sensitive     RIFAMPIN <=0.5 SENSITIVE Sensitive     Inducible Clindamycin NEGATIVE Sensitive     * 20,000 COLONIES/mL STAPHYLOCOCCUS AUREUS  Resp Panel by RT-PCR (Flu A&B, Covid) Nasopharyngeal Swab     Status: None   Collection Time: 09/12/20  9:20 AM   Specimen: Nasopharyngeal Swab; Nasopharyngeal(NP) swabs in vial transport medium  Result Value Ref Range Status   SARS Coronavirus 2 by RT PCR NEGATIVE NEGATIVE Final    Comment: (NOTE) SARS-CoV-2 target nucleic acids are NOT DETECTED.  The SARS-CoV-2 RNA is generally detectable in upper respiratory specimens during the acute phase of infection. The lowest concentration of SARS-CoV-2 viral copies this assay can detect is 138 copies/mL. A negative result does not preclude SARS-Cov-2 infection and should not be used as the sole basis for treatment or other patient management decisions. A negative result may occur with  improper specimen collection/handling, submission of specimen other than nasopharyngeal swab, presence of viral mutation(s) within the areas targeted by this assay, and inadequate number of viral copies(<138 copies/mL). A negative result must be combined with clinical observations, patient history, and epidemiological information. The expected result is Negative.  Fact Sheet for Patients:  BloggerCourse.com  Fact Sheet for Healthcare Providers:  SeriousBroker.it  This test is no t yet approved or cleared by the [place] FDA and  has been authorized for detection and/or diagnosis of SARS-CoV-2 by FDA under an Emergency Use Authorization (EUA). This EUA will remain  in effect (meaning this test can be used) for the duration of the COVID-19 declaration  under Section 564(b)(1) of the Act, 21 U.S.C.section 360bbb-3(b)(1), unless the authorization is terminated  or revoked sooner.       Influenza A by PCR NEGATIVE NEGATIVE Final   Influenza B by PCR NEGATIVE NEGATIVE Final    Comment: (NOTE) The Xpert Xpress SARS-CoV-2/FLU/RSV plus assay is intended as an aid in the diagnosis of influenza from Nasopharyngeal swab specimens and should not be used as a sole basis for treatment.  Nasal washings and aspirates are unacceptable for Xpert Xpress SARS-CoV-2/FLU/RSV testing.  Fact Sheet for Patients: BloggerCourse.com  Fact Sheet for Healthcare Providers: SeriousBroker.it  This test is not yet approved or cleared by the [place] FDA and has been authorized for detection and/or diagnosis of SARS-CoV-2 by FDA under an Emergency Use Authorization (EUA). This EUA will remain in effect (meaning this test can be used) for the duration of the COVID-19 declaration under Section 564(b)(1) of the Act, 21 U.S.C. section 360bbb-3(b)(1), unless the authorization is terminated or revoked.  Performed at [facility], [address] [place], [place] [address]       Scheduled Meds:  acidophilus  2 capsule Oral TID   albuterol  2.5 mg Nebulization QID   atorvastatin  20 mg Oral Daily   carvedilol  12.5 mg Oral BID WC   cephALEXin  500 mg Oral Q6H   cholestyramine  4 g Oral BID   dorzolamide-timolol  1 drop Both Eyes BID   enoxaparin (LOVENOX) injection  0.5 mg/kg Subcutaneous Q24H   fluconazole  100 mg Oral Daily   furosemide  40 mg Oral Daily   hydrocerin   Topical Daily   insulin aspart  0-9 Units Subcutaneous TID AC & HS   insulin glargine  20 Units Subcutaneous BID   levETIRAcetam  750 mg Oral BID   linagliptin  5 mg Oral Daily   metroNIDAZOLE  500 mg Oral Q12H   potassium chloride  40 mEq Oral Daily   sacubitril-valsartan  1 tablet Oral BID   Continuous Infusions:   sodium chloride 250 mL (09/09/20 0531)    Assessment/Plan:  Bilateral lower extremity cellulitis and lymphedema.  Now on oral Keflex and Flagyl and Diflucan.  Discharge canceled yesterday because the facility at the rehab did not have a bariatric bed Accelerated hypertension on admission now with essential hypertension.  On Entresto Coreg and Lasix Diarrhea.  Likely secondary to oral antibiotic.  Trial of cholestyramine.  Check CBC in the morning Type 2 diabetes mellitus with hyperlipidemia.  On glargine insulin and linagliptin and aspart insulin before meals Chronic diastolic congestive heart failure on Entresto Coreg and Lasix Seizure disorder on Keppra Morbid obesity with a BMI of 60.12 Weakness.  Physical therapy recommends rehab        Code Status:     Code Status Orders  (From admission, onward)           Start     Ordered   09/07/20 0125  Full code  Continuous        09/07/20 0136           Code Status History     Date Active Date Inactive Code Status Order ID Comments User Context   12/07/2019 1742 12/15/2019 0138 Full Code [id number]  Norins, Rosalyn Gess, MD ED      Family Communication: Spoke with Katrina on the phone Disposition Plan: Status is: Inpatient  Dispo: The patient is from: Home              Anticipated d/c is to: Rehab              Patient currently medically stable for discharge to rehab.  Awaiting for rehab facility order bariatric bed   Difficult to place patient.  No  Consultants: Infectious disease  Antibiotics: Keflex, Flagyl, Diflucan  Time spent: 27 minutes  [employer]

## 2020-09-14 LAB — BASIC METABOLIC PANEL
Anion gap: 7 (ref 5–15)
BUN: 10 mg/dL (ref 8–23)
CO2: 31 mmol/L (ref 22–32)
Calcium: 8.2 mg/dL — ABNORMAL LOW (ref 8.9–10.3)
Chloride: 102 mmol/L (ref 98–111)
Creatinine, Ser: 0.58 mg/dL (ref 0.44–1.00)
GFR, Estimated: >60 mL/min (ref >60)
Glucose, Bld: 116 mg/dL — ABNORMAL HIGH (ref 70–99)
Potassium: 4.2 mmol/L (ref 3.5–5.1)
Sodium: 140 mmol/L (ref 135–145)

## 2020-09-14 LAB — GLUCOSE, CAPILLARY
Glucose-Capillary: 105 mg/dL — ABNORMAL HIGH (ref 70–99)
Glucose-Capillary: 118 mg/dL — ABNORMAL HIGH (ref 70–99)
Glucose-Capillary: 110 mg/dL — ABNORMAL HIGH (ref 70–99)
Glucose-Capillary: 188 mg/dL — ABNORMAL HIGH (ref 70–99)

## 2020-09-14 LAB — CBC
HCT: 33.6 % — ABNORMAL LOW (ref 36.0–46.0)
Hemoglobin: 10.6 g/dL — ABNORMAL LOW (ref 12.0–15.0)
MCH: 26.5 pg (ref 26.0–34.0)
MCHC: 31.5 g/dL (ref 30.0–36.0)
MCV: 84 fL (ref 80.0–100.0)
Platelets: 270 10*3/uL (ref 150–400)
RBC: 4 MIL/uL (ref 3.87–5.11)
RDW: 14.6 % (ref 11.5–15.5)
WBC: 7.1 10*3/uL (ref 4.0–10.5)
nRBC: 0 % (ref 0.0–0.2)

## 2020-09-14 LAB — PHOSPHORUS: Phosphorus: 2.6 mg/dL (ref 2.5–4.6)

## 2020-09-14 LAB — MAGNESIUM: Magnesium: 1.9 mg/dL (ref 1.7–2.4)

## 2020-09-14 MED ORDER — INSULIN GLARGINE 100 UNIT/ML ~~LOC~~ SOLN
13.0000 [IU] | Freq: Two times a day (BID) | SUBCUTANEOUS | Status: DC
  Administered 2020-09-14 – 2020-09-15 (×2): 13 [IU] via SUBCUTANEOUS
  Filled 2020-09-14 (×4): qty 0.13

## 2020-09-14 MED ORDER — SPIRONOLACTONE 25 MG PO TABS
12.5000 mg | ORAL_TABLET | Freq: Every day | ORAL | Status: DC
  Administered 2020-09-14 – 2020-09-15 (×2): 12.5 mg via ORAL
  Filled 2020-09-14: qty 0.5
  Filled 2020-09-14 (×2): qty 1
  Filled 2020-09-14: qty 0.5

## 2020-09-14 MED ORDER — LOPERAMIDE HCL 2 MG PO CAPS
4.0000 mg | ORAL_CAPSULE | Freq: Three times a day (TID) | ORAL | Status: DC | PRN
  Administered 2020-09-14 (×2): 4 mg via ORAL
  Filled 2020-09-14 (×2): qty 2

## 2020-09-14 MED ORDER — INSULIN GLARGINE 100 UNIT/ML ~~LOC~~ SOLN
16.0000 [IU] | Freq: Two times a day (BID) | SUBCUTANEOUS | Status: DC
  Administered 2020-09-14: 16 [IU] via SUBCUTANEOUS
  Filled 2020-09-14 (×3): qty 0.16

## 2020-09-14 MED ORDER — POTASSIUM CHLORIDE CRYS ER 20 MEQ PO TBCR
20.0000 meq | EXTENDED_RELEASE_TABLET | Freq: Every day | ORAL | Status: DC
  Administered 2020-09-14: 20 meq via ORAL
  Filled 2020-09-14: qty 1

## 2020-09-14 MED ORDER — CARVEDILOL 25 MG PO TABS
25.0000 mg | ORAL_TABLET | Freq: Two times a day (BID) | ORAL | Status: DC
  Administered 2020-09-14: 25 mg via ORAL
  Filled 2020-09-14: qty 1

## 2020-09-14 NOTE — Progress Notes (Signed)
Patient ID: Grace Short, female   DOB: [birth year], 75 y.o.   MRN: [id number] [facility] PROGRESS NOTE  Grace Short EVO:[id number] DOB: [birth year] DOA: 09/06/2020 PCP: Shane Crutch, PA  HPI/Subjective: Patient feels okay.  Still having some diarrhea.  No abdominal pain.  No fever.  Initially admitted with bilateral lower extremity cellulitis.  Objective: Vitals:   09/14/20 1140 09/14/20 1510  BP:    Pulse:    Resp:    Temp:    SpO2: 96% 96%    Intake/Output Summary (Last 24 hours) at 09/14/2020 1548 Last data filed at 09/14/2020 1011 Gross per 24 hour  Intake 240 ml  Output 2000 ml  Net -1760 ml   Filed Weights   09/06/20 1814 09/07/20 0400  Weight: 136.1 kg (!) 149.1 kg    ROS: Review of Systems  Respiratory:  Negative for shortness of breath.   Cardiovascular:  Negative for chest pain.  Gastrointestinal:  Negative for abdominal pain, nausea and vomiting.  Exam: Physical Exam HENT:     Head: Normocephalic.     Mouth/Throat:     Pharynx: No oropharyngeal exudate.  Eyes:     General: Lids are normal.     Conjunctiva/sclera: Conjunctivae normal.     Pupils: Pupils are equal, round, and reactive to light.  Cardiovascular:     Rate and Rhythm: Normal rate and regular rhythm.     Heart sounds: Normal heart sounds, S1 normal and S2 normal.  Pulmonary:     Breath sounds: Examination of the right-lower field reveals decreased breath sounds. Examination of the left-lower field reveals decreased breath sounds. Decreased breath sounds present. No wheezing, rhonchi or rales.  Abdominal:     Palpations: Abdomen is soft.     Tenderness: There is no abdominal tenderness.  Musculoskeletal:     Right lower leg: Swelling present.     Left lower leg: Swelling present.  Skin:    General: Skin is warm.     Comments: Lower legs wrapped.  Less fullness on the lower legs and upper legs.  Erythema faded  Neurological:     Mental Status: She is alert and oriented to person,  place, and time.     Data Reviewed: Basic Metabolic Panel: Recent Labs  Lab 09/08/20 0515 09/09/20 0422 09/11/20 0556 09/12/20 0422 09/14/20 0540  NA 141 139 141 140 140  K 3.5 3.7 3.1* 3.3* 4.2  CL 109 102 101 103 102  CO2 29 30 30  32 31  GLUCOSE 104* 163* 110* 128* 116*  BUN 9 7* 8 12 10   CREATININE 0.66 0.61 0.55 0.53 0.58  CALCIUM 7.9* 8.1* 8.0* 8.1* 8.2*  MG 1.7 1.7 1.9 1.9 1.9  PHOS 2.7 2.7  --   --  2.6   Liver Function Tests: Recent Labs  Lab 09/08/20 0515 09/09/20 0422  AST 34 24  ALT 15 12  ALKPHOS 41 45  BILITOT 0.6 0.6  PROT 5.5* 6.1*  ALBUMIN 2.4* 2.6*   CBC: Recent Labs  Lab 09/08/20 0515 09/09/20 0422 09/10/20 0731 09/11/20 0556 09/14/20 0659  WBC 9.1 12.3* 13.6* 11.7* 7.1  NEUTROABS 6.0 9.8* 10.5*  --   --   HGB 9.1* 10.4* 11.4* 10.7* 10.6*  HCT 27.9* 33.0* 36.0 34.3* 33.6*  MCV 81.6 82.7 82.6 82.7 84.0  PLT 244 255 239 257 270     CBG: Recent Labs  Lab 09/13/20 1136 09/13/20 1634 09/13/20 2104 09/14/20 0728 09/14/20 1133  GLUCAP 143* 92 122* 105* 118*  Recent Results (from the past 240 hour(s))  Blood Culture (routine x 2)     Status: None   Collection Time: 09/06/20  6:18 PM   Specimen: BLOOD  Result Value Ref Range Status   Specimen Description BLOOD RIGHT ANTECUBITAL  Final   Special Requests   Final    BOTTLES DRAWN AEROBIC AND ANAEROBIC Blood Culture adequate volume   Culture   Final    NO GROWTH 5 DAYS Performed at [facility], [address] [place], [place] [address]    Report Status 09/12/2020 FINAL  Final  Blood Culture (routine x 2)     Status: None   Collection Time: 09/06/20  6:18 PM   Specimen: BLOOD  Result Value Ref Range Status   Specimen Description BLOOD BLOOD LEFT WRIST  Final   Special Requests   Final    BOTTLES DRAWN AEROBIC AND ANAEROBIC Blood Culture adequate volume   Culture   Final    NO GROWTH 5 DAYS Performed at [facility], [address] [place], [place]  [address]    Report Status 09/12/2020 FINAL  Final  Resp Panel by RT-PCR (Flu A&B, Covid) Nasopharyngeal Swab     Status: None   Collection Time: 09/07/20  1:37 AM   Specimen: Nasopharyngeal Swab; Nasopharyngeal(NP) swabs in vial transport medium  Result Value Ref Range Status   SARS Coronavirus 2 by RT PCR NEGATIVE NEGATIVE Final    Comment: (NOTE) SARS-CoV-2 target nucleic acids are NOT DETECTED.  The SARS-CoV-2 RNA is generally detectable in upper respiratory specimens during the acute phase of infection. The lowest concentration of SARS-CoV-2 viral copies this assay can detect is 138 copies/mL. A negative result does not preclude SARS-Cov-2 infection and should not be used as the sole basis for treatment or other patient management decisions. A negative result may occur with  improper specimen collection/handling, submission of specimen other than nasopharyngeal swab, presence of viral mutation(s) within the areas targeted by this assay, and inadequate number of viral copies(<138 copies/mL). A negative result must be combined with clinical observations, patient history, and epidemiological information. The expected result is Negative.  Fact Sheet for Patients:  BloggerCourse.com  Fact Sheet for Healthcare Providers:  SeriousBroker.it  This test is no t yet approved or cleared by the [place] FDA and  has been authorized for detection and/or diagnosis of SARS-CoV-2 by FDA under an Emergency Use Authorization (EUA). This EUA will remain  in effect (meaning this test can be used) for the duration of the COVID-19 declaration under Section 564(b)(1) of the Act, 21 U.S.C.section 360bbb-3(b)(1), unless the authorization is terminated  or revoked sooner.       Influenza A by PCR NEGATIVE NEGATIVE Final   Influenza B by PCR NEGATIVE NEGATIVE Final    Comment: (NOTE) The Xpert Xpress SARS-CoV-2/FLU/RSV plus assay is intended as an  aid in the diagnosis of influenza from Nasopharyngeal swab specimens and should not be used as a sole basis for treatment. Nasal washings and aspirates are unacceptable for Xpert Xpress SARS-CoV-2/FLU/RSV testing.  Fact Sheet for Patients: BloggerCourse.com  Fact Sheet for Healthcare Providers: SeriousBroker.it  This test is not yet approved or cleared by the [place] FDA and has been authorized for detection and/or diagnosis of SARS-CoV-2 by FDA under an Emergency Use Authorization (EUA). This EUA will remain in effect (meaning this test can be used) for the duration of the COVID-19 declaration under Section 564(b)(1) of the Act, 21 U.S.C. section 360bbb-3(b)(1), unless the authorization  is terminated or revoked.  Performed at [facility], [address] [place], [place] [address]   Urine culture     Status: Abnormal   Collection Time: 09/07/20  6:38 AM   Specimen: In/Out Cath Urine  Result Value Ref Range Status   Specimen Description   Final    IN/OUT CATH URINE Performed at [facility], [address] [place], [place] [address]    Special Requests   Final    NONE Performed at [facility], [address] [place], [place] [address]    Culture (A)  Final    20,000 COLONIES/mL STAPHYLOCOCCUS AUREUS 80,000 COLONIES/mL DIPHTHEROIDS(CORYNEBACTERIUM SPECIES) Standardized susceptibility testing for this organism is not available. Performed at [facility], [address] [place], [place] [address]    Report Status 09/09/2020 FINAL  Final   Organism ID, Bacteria STAPHYLOCOCCUS AUREUS (A)  Final      Susceptibility   Staphylococcus aureus - MIC*    CIPROFLOXACIN <=0.5 SENSITIVE Sensitive     GENTAMICIN <=0.5 SENSITIVE Sensitive     NITROFURANTOIN 32 SENSITIVE Sensitive     OXACILLIN 0.5 SENSITIVE Sensitive     TETRACYCLINE <=1 SENSITIVE Sensitive     VANCOMYCIN 1 SENSITIVE  Sensitive     TRIMETH/SULFA <=10 SENSITIVE Sensitive     CLINDAMYCIN <=0.25 SENSITIVE Sensitive     RIFAMPIN <=0.5 SENSITIVE Sensitive     Inducible Clindamycin NEGATIVE Sensitive     * 20,000 COLONIES/mL STAPHYLOCOCCUS AUREUS  Resp Panel by RT-PCR (Flu A&B, Covid) Nasopharyngeal Swab     Status: None   Collection Time: 09/12/20  9:20 AM   Specimen: Nasopharyngeal Swab; Nasopharyngeal(NP) swabs in vial transport medium  Result Value Ref Range Status   SARS Coronavirus 2 by RT PCR NEGATIVE NEGATIVE Final    Comment: (NOTE) SARS-CoV-2 target nucleic acids are NOT DETECTED.  The SARS-CoV-2 RNA is generally detectable in upper respiratory specimens during the acute phase of infection. The lowest concentration of SARS-CoV-2 viral copies this assay can detect is 138 copies/mL. A negative result does not preclude SARS-Cov-2 infection and should not be used as the sole basis for treatment or other patient management decisions. A negative result may occur with  improper specimen collection/handling, submission of specimen other than nasopharyngeal swab, presence of viral mutation(s) within the areas targeted by this assay, and inadequate number of viral copies(<138 copies/mL). A negative result must be combined with clinical observations, patient history, and epidemiological information. The expected result is Negative.  Fact Sheet for Patients:  BloggerCourse.comhttps://www.fda.gov/media/152166/download  Fact Sheet for Healthcare Providers:  SeriousBroker.ithttps://www.fda.gov/media/152162/download  This test is no t yet approved or cleared by the [place] FDA and  has been authorized for detection and/or diagnosis of SARS-CoV-2 by FDA under an Emergency Use Authorization (EUA). This EUA will remain  in effect (meaning this test can be used) for the duration of the COVID-19 declaration under Section 564(b)(1) of the Act, 21 U.S.C.section 360bbb-3(b)(1), unless the authorization is terminated  or revoked sooner.        Influenza A by PCR NEGATIVE NEGATIVE Final   Influenza B by PCR NEGATIVE NEGATIVE Final    Comment: (NOTE) The Xpert Xpress SARS-CoV-2/FLU/RSV plus assay is intended as an aid in the diagnosis of influenza from Nasopharyngeal swab specimens and should not be used as a sole basis for treatment. Nasal washings and aspirates are unacceptable for Xpert Xpress SARS-CoV-2/FLU/RSV testing.  Fact Sheet for Patients: BloggerCourse.comhttps://www.fda.gov/media/152166/download  Fact Sheet for Healthcare Providers: SeriousBroker.ithttps://www.fda.gov/media/152162/download  This test is not yet approved or cleared by the [place] and has been authorized for detection and/or diagnosis of SARS-CoV-2 by FDA under an Emergency Use Authorization (EUA). This EUA will remain in effect (meaning this test can be used) for the duration of the COVID-19 declaration under Section 564(b)(1) of the Act, 21 U.S.C. section 360bbb-3(b)(1), unless the authorization is terminated or revoked.  Performed at [facility], [address] [place], [place] [address]       Scheduled Meds:  acidophilus  2 capsule Oral TID   albuterol  2.5 mg Nebulization QID   atorvastatin  20 mg Oral Daily   carvedilol  12.5 mg Oral BID WC   cephALEXin  500 mg Oral Q6H   cholestyramine  4 g Oral BID   dorzolamide-timolol  1 drop Both Eyes BID   enoxaparin (LOVENOX) injection  0.5 mg/kg Subcutaneous Q24H   fluconazole  100 mg Oral Daily   furosemide  40 mg Oral Daily   hydrocerin   Topical Daily   insulin aspart  0-9 Units Subcutaneous TID AC & HS   insulin glargine  16 Units Subcutaneous BID   levETIRAcetam  750 mg Oral BID   linagliptin  5 mg Oral Daily   metroNIDAZOLE  500 mg Oral Q12H   potassium chloride  20 mEq Oral Daily   sacubitril-valsartan  1 tablet Oral BID   spironolactone  12.5 mg Oral Daily   Continuous Infusions:  sodium chloride 250 mL (09/09/20 0531)    Assessment/Plan:  Bilateral lower extremity  cellulitis and lymphedema.  Continue oral Keflex, Flagyl and Diflucan.  Awaiting for facility to get a bariatric bed. Essential hypertension on admission now with essential hypertension.  On Entresto.  Increase Coreg to 25 mg twice daily.  Continue Lasix.  Add low-dose spironolactone. Diarrhea.  White blood cell count normal and not in having any fever.  Trial of cholestyramine.  Probiotic. Type 2 diabetes mellitus with hyperlipidemia.  Continue glargine insulin but will decrease dose to 13 units twice a day and linagliptin and aspart insulin before meals Chronic diastolic congestive heart failure on Entresto Coreg and Lasix Seizure disorder.  Continue Keppra Morbid obesity with a BMI of 60.1 cm Weakness.  Physical therapy recommends rehab Hypokalemia replace.  We will get rid of potassium supplementation since starting spironolactone    Code Status:     Code Status Orders  (From admission, onward)           Start     Ordered   09/07/20 0125  Full code  Continuous        09/07/20 0136           Code Status History     Date Active Date Inactive Code Status Order ID Comments User Context   12/07/2019 1742 12/15/2019 0138 Full Code [id number]  Norins, Rosalyn Gess, MD ED      Family Communication: Spoke with Grace Short Disposition Plan: Status is: Inpatient  Dispo: The patient is from: Home              Anticipated d/c is to: Rehab              Patient currently Medically stable.  Awaiting rehab facility to get a bariatric bed prior to discharge.   Difficult to place patient. No  Time spent: 26 minutes  Jaliza [employer]

## 2020-09-14 NOTE — Plan of Care (Signed)
?  Problem: Activity: ?Goal: Capacity to carry out activities will improve ?Outcome: Progressing ?  ?

## 2020-09-14 NOTE — Progress Notes (Signed)
Physical Therapy Treatment Patient Details Name: Grace Short MRN: [id number] DOB: 1945/12/28 Today's Date: 09/14/2020    History of Present Illness Grace Short is a 75 y.o. female with medical history significant for morbid obesity, chronic lymphedema, HFpEF, COPD, CVA, type diabetes mellitus, hypertension and dyslipidemia, who presented to the ER with acute onset of bilateral lower extremity worsening swelling with erythema, warmth and tenderness with bruising.  She denies any fever or chills.  No chest pain or dyspnea or cough or wheezing hemoptysis.  No dysuria, oliguria or hematuria or flank pain.    PT Comments    Pt initially declines therapy stating she cannot do it.  She does agree to repositioning of BLE's due to general discomfort.  Pressure boots removed for ex's.  She does toe taps and quad sets but stated it is too painful for further exercises.  She refuses to have pressure boots reapplied after session stating they hurt her and are uncomfortable.  She does request pain medication and is relayed to RN.      Follow Up Recommendations  SNF     Equipment Recommendations   (TBD in next venue)    Recommendations for Other Services       Precautions / Restrictions Precautions Precautions: Fall Restrictions Weight Bearing Restrictions: No RLE Weight Bearing: Weight bearing as tolerated LLE Weight Bearing: Weight bearing as tolerated    Mobility  Bed Mobility                    Transfers                    Ambulation/Gait                 Stairs             Wheelchair Mobility    Modified Rankin (Stroke Patients Only)       Balance                                            Cognition Arousal/Alertness: Awake/alert Behavior During Therapy: WFL for tasks assessed/performed Overall Cognitive Status: Within Functional Limits for tasks assessed                                         Exercises Other Exercises Other Exercises: BLE AAROM - mostly isometrics    General Comments        Pertinent Vitals/Pain Pain Assessment: Faces Faces Pain Scale: Hurts whole lot Pain Location: B LEs Pain Descriptors / Indicators: Sore;Discomfort Pain Intervention(s): Patient requesting pain meds-RN notified;Limited activity within patient's tolerance;Monitored during session;Repositioned    Home Living                      Prior Function            PT Goals (current goals can now be found in the care plan section) Progress towards PT goals: Not progressing toward goals - comment    Frequency    Min 2X/week      PT Plan Current plan remains appropriate    Co-evaluation              AM-PAC PT "6 Clicks" Mobility   Outcome Measure  Help needed turning from  your back to your side while in a flat bed without using bedrails?: Total Help needed moving from lying on your back to sitting on the side of a flat bed without using bedrails?: Total Help needed moving to and from a bed to a chair (including a wheelchair)?: Total Help needed standing up from a chair using your arms (e.g., wheelchair or bedside chair)?: Total Help needed to walk in hospital room?: Total Help needed climbing 3-5 steps with a railing? : Total 6 Click Score: 6    End of Session   Activity Tolerance:  (pt self limiting) Patient left: in bed;with call bell/phone within reach;with nursing/sitter in room Nurse Communication: Patient requests pain meds PT Visit Diagnosis: Muscle weakness (generalized) (M62.81);Other abnormalities of gait and mobility (R26.89);Difficulty in walking, not elsewhere classified (R26.2);Pain Pain - Right/Left:  (BLE) Pain - part of body: Ankle and joints of foot;Leg     Time: 1040-1048 PT Time Calculation (min) (ACUTE ONLY): 8 min  Charges:  $Therapeutic Exercise: 8-22 mins                    Danielle Dess, PTA 09/14/20, 11:23 AM , 11:20 AM

## 2020-09-14 NOTE — TOC Progression Note (Signed)
Transition of Care (TOC) - Progression Note    Patient Details  Name: Grace Short MRN: [id number] Date of Birth: 1945/05/29  [facility]) CM/SW Contact  Barrie Dunker, RN Phone Number: 09/14/2020, 9:27 AM  Clinical Narrative:     Reached out to [facility] tonya to inquire about the ETA of the Bariatric bed and Mattress, she is checking on it and will let me know  Expected Discharge Plan: Home/Self Care Barriers to Discharge: Barriers Resolved  Expected Discharge Plan and Services Expected Discharge Plan: Home/Self Care   Discharge Planning Services: CM Consult   Living arrangements for the past 2 months: Single Family Home Expected Discharge Date: 09/12/20               DME Arranged: N/A DME Agency:  (Going to SNF)       HH Arranged: NA HH Agency: NA         Social Determinants of Health (SDOH) Interventions    Readmission Risk Interventions No flowsheet data found.

## 2020-09-15 LAB — GLUCOSE, CAPILLARY
Glucose-Capillary: 87 mg/dL (ref 70–99)
Glucose-Capillary: 176 mg/dL — ABNORMAL HIGH (ref 70–99)

## 2020-09-15 LAB — RESP PANEL BY RT-PCR (FLU A&B, COVID) ARPGX2
Influenza A by PCR: NEGATIVE
Influenza B by PCR: NEGATIVE
SARS Coronavirus 2 by RT PCR: NEGATIVE

## 2020-09-15 MED ORDER — INSULIN GLARGINE 100 UNIT/ML ~~LOC~~ SOLN: 13.0000 [IU] | Freq: Two times a day (BID) | SUBCUTANEOUS | 11 refills | Status: DC

## 2020-09-15 MED ORDER — SPIRONOLACTONE 25 MG PO TABS: 12.5000 mg | ORAL_TABLET | Freq: Every day | ORAL | 0 refills | Status: DC

## 2020-09-15 MED ORDER — METRONIDAZOLE 500 MG PO TABS: 500.0000 mg | ORAL_TABLET | Freq: Two times a day (BID) | ORAL | 0 refills | Status: AC

## 2020-09-15 MED ORDER — ALBUTEROL SULFATE (2.5 MG/3ML) 0.083% IN NEBU: 2.5000 mg | INHALATION_SOLUTION | Freq: Four times a day (QID) | RESPIRATORY_TRACT | Status: DC | PRN

## 2020-09-15 MED ORDER — CARVEDILOL 12.5 MG PO TABS
12.5000 mg | ORAL_TABLET | Freq: Two times a day (BID) | ORAL | Status: DC
  Administered 2020-09-15: 12.5 mg via ORAL
  Filled 2020-09-15: qty 1

## 2020-09-15 MED ORDER — FLUCONAZOLE 100 MG PO TABS: 100.0000 mg | ORAL_TABLET | Freq: Every day | ORAL | 0 refills | Status: DC

## 2020-09-15 MED ORDER — CHOLESTYRAMINE 4 G PO PACK: 4.0000 g | PACK | Freq: Two times a day (BID) | ORAL | 0 refills | Status: AC

## 2020-09-15 MED ORDER — CEPHALEXIN 500 MG PO CAPS: 500.0000 mg | ORAL_CAPSULE | Freq: Four times a day (QID) | ORAL | 0 refills | Status: AC

## 2020-09-15 MED ORDER — RISAQUAD PO CAPS: 2.0000 | ORAL_CAPSULE | Freq: Three times a day (TID) | ORAL | 0 refills | Status: AC

## 2020-09-15 NOTE — TOC Progression Note (Signed)
Transition of Care (TOC) - Progression Note    Patient Details  Name: Grace Short. [place] MRN: [id number] Date of Birth: 1945-04-09  Transition of Care [facility]) CM/SW Contact  Barrie Dunker, RN Phone Number: 09/15/2020, 1:34 PM  Clinical Narrative:    Spoke with Katrina the patient's room mate and friend I notified her that the patient will be transported by EMS today to [facility], she is on her way to the hospital to visit [facility] was called to transport and she is on the list  Expected Discharge Plan: Home/Self Care Barriers to Discharge: Barriers Resolved  Expected Discharge Plan and Services Expected Discharge Plan: Home/Self Care   Discharge Planning Services: CM Consult   Living arrangements for the past 2 months: Single Family Home Expected Discharge Date: 09/15/20               DME Arranged: N/A DME Agency:  (Going to SNF)       HH Arranged: NA HH Agency: NA         Social Determinants of Health (SDOH) Interventions    Readmission Risk Interventions No flowsheet data found.

## 2020-09-15 NOTE — TOC Progression Note (Addendum)
Transition of Care (TOC) - Progression Note    Patient Details  Name: Grace Tredway. Short MRN: [id number] Date of Birth: 20-Jul-1945  Transition of Care [facility]) CM/SW Contact  Barrie Dunker, RN Phone Number: 09/15/2020, 8:53 AM  Clinical Narrative:     Reached out to Archie Patten at [facility] to inquire about the delivery of the Bariatric Bed, Awaiting a response to verify it was delivered so she can DC to the [facility] today  The bed will be available today at noon, the patient has not been vaccinated for Covid  Expected Discharge Plan: Home/Self Care Barriers to Discharge: Barriers Resolved  Expected Discharge Plan and Services Expected Discharge Plan: Home/Self Care   Discharge Planning Services: CM Consult   Living arrangements for the past 2 months: Single Family Home Expected Discharge Date: 09/12/20               DME Arranged: N/A DME Agency:  (Going to SNF)       HH Arranged: NA HH Agency: NA         Social Determinants of Health (SDOH) Interventions    Readmission Risk Interventions No flowsheet data found.

## 2020-09-15 NOTE — Progress Notes (Signed)
PT IV removed. Pt had asked to do dressing changes later, facility was made aware of the dressing change needs. Pt transported by EMS stretcher.

## 2020-09-15 NOTE — Discharge Summary (Signed)
[facility] - [place] at [facility]   PATIENT NAME: Grace Short    MR#:  [id number]  DATE OF BIRTH:  1945-08-22  DATE OF ADMISSION:  09/06/2020 ADMITTING PHYSICIAN: Hannah Beat, MD  DATE OF DISCHARGE: 09/15/2020  PRIMARY CARE PHYSICIAN: Shane Crutch, PA    ADMISSION DIAGNOSIS:  Lower extremity cellulitis [L03.119]  DISCHARGE DIAGNOSIS:  Active Problems:   Chronic diastolic CHF (congestive heart failure) (HCC)   Lymphedema   Morbid obesity with BMI of 60.0-69.9, adult (HCC)   Seizure (HCC)   Essential hypertension   Cellulitis of both lower extremities   Hypertensive urgency   SECONDARY DIAGNOSIS:   Past Medical History:  Diagnosis Date  . Chronic acquired lymphedema   . Chronic heart failure with preserved ejection fraction (HFpEF) (HCC)   . COPD (chronic obstructive pulmonary disease) (HCC)   . CVA (cerebral vascular accident) (HCC)   . DM (diabetes mellitus) (HCC)   . HLD (hyperlipidemia)   . Hypertension   . Morbid obesity with BMI of 50.0-59.9, adult (HCC)   . Partial seizure disorder (HCC)   . SSS (sick sinus syndrome) [facility])     HOSPITAL COURSE:   Bilateral lower extremity cellulitis and lymphedema.  Patient initially was admitted to the hospital and started on Rocephin and vancomycin and antibiotics were then switched over to Ancef alone.  The patient spiked a temperature on 09/09/2020.  I obtained an infectious disease consultation they added Flagyl to the Ancef.  Also added Diflucan.  The patient remained afebrile and is stable to go out to rehab facility.  Keflex 500 mg every 6 hours and Flagyl 500 mg every 12 hours were approved prescribed through the end of 09/18/2020.  Diflucan 100 mg p.o. daily was completed today.  Wound care bilateral lower extremities: Wash legs thoroughly with soap and water with attention to skin folds and between toes.  Pat dry.  Apply Eucerin cream to extremities except in skin folds and between toes.  Cover areas of  weeping with Xeroform gauze [place] [phone]).  Wrap from just above the toes to just below the knees with Kerlix roll gauze and top with 6 inch Ace bandage.  Place feet in Prevalon boots. Accelerated hypertension on admission now with essential hypertension.  I increased the Entresto to max dose.  I switched her Toprol over to Coreg.  Continue oral Lasix.  I added low-dose spironolactone.  The patient does have a pacemaker so if you need to go up on the Coreg to 25 mg twice daily you can.  Do not hold the Coreg with regards to heart rate. Type 2 diabetes mellitus with hyperlipidemia.  Patient on linagliptin and glargine insulin twice daily and aspart before meals. Diarrhea likely secondary to antibiotics.  Prescribing cholestyramine twice daily for 1 week.  Since the patient is afebrile and normal white count we did not do any stool testing because she has been in the hospital over 4 days. Chronic diastolic congestive heart failure on Entresto, Coreg and Lasix Seizure disorder on Keppra Weakness.  Physical therapy recommends rehab Morbid obesity with a BMI of 60.12  DISCHARGE CONDITIONS:   Satisfactory  CONSULTS OBTAINED:  Treatment Team:  Lynn Ito, MD  DRUG ALLERGIES:   Allergies  Allergen Reactions  . Ativan [Lorazepam]     DISCHARGE MEDICATIONS:   Allergies as of 09/15/2020       Reactions   Ativan [lorazepam]         Medication List     STOP  taking these medications    Entresto 49-51 MG Generic drug: sacubitril-valsartan Replaced by: sacubitril-valsartan 97-103 MG   guaiFENesin-dextromethorphan 100-10 MG/5ML syrup Commonly known as: ROBITUSSIN DM   hydrALAZINE 50 MG tablet Commonly known as: APRESOLINE   metoprolol succinate 25 MG 24 hr tablet Commonly known as: TOPROL-XL   potassium chloride SA 20 MEQ tablet Commonly known as: KLOR-CON   predniSONE 10 MG tablet Commonly known as: DELTASONE       TAKE these medications    acetaminophen  325 MG tablet Commonly known as: TYLENOL Take 650 mg by mouth 2 (two) times daily as needed.   acidophilus Caps capsule Take 2 capsules by mouth 3 (three) times daily.   albuterol 108 (90 Base) MCG/ACT inhaler Commonly known as: VENTOLIN HFA Inhale 2 puffs into the lungs once as needed for wheezing or shortness of breath (for Grade 3 or 4 hypersensitivity reaction).   atorvastatin 20 MG tablet Commonly known as: LIPITOR Take 20 mg by mouth daily.   carvedilol 12.5 MG tablet Commonly known as: COREG Take 1 tablet (12.5 mg total) by mouth 2 (two) times daily with a meal.   cephALEXin 500 MG capsule Commonly known as: KEFLEX Take 1 capsule (500 mg total) by mouth every 6 (six) hours for 4 days.   cholecalciferol 25 MCG (1000 UNIT) tablet Commonly known as: VITAMIN D3 Take 1,000 Units by mouth every other day.   cholestyramine 4 g packet Commonly known as: QUESTRAN Take 1 packet (4 g total) by mouth 2 (two) times daily for 7 days.   dorzolamide-timolol 22.3-6.8 MG/ML ophthalmic solution Commonly known as: COSOPT Place 1 drop into both eyes 2 (two) times daily.   furosemide 40 MG tablet Commonly known as: LASIX Take 1 tablet (40 mg total) by mouth daily.   hydrocerin Crea Apply 1 application topically daily.   insulin glargine 100 UNIT/ML injection Commonly known as: LANTUS Inject 0.13 mLs (13 Units total) into the skin 2 (two) times daily. What changed: how much to take   insulin lispro 100 UNIT/ML injection Commonly known as: HUMALOG Inject 0.06 mLs (6 Units total) into the skin 3 (three) times daily with meals. Inject 10u under the skin three times daily at mealtimes (plus applicable sliding scale) - do not use if blood glucose <150 What changed:  how much to take when to take this   levETIRAcetam 750 MG tablet Commonly known as: KEPPRA Take 750 mg by mouth 2 (two) times daily.   linagliptin 5 MG Tabs tablet Commonly known as: TRADJENTA Take 5 mg by mouth  daily.   loratadine 10 MG tablet Commonly known as: CLARITIN Take 10 mg by mouth daily as needed for allergies.   metroNIDAZOLE 500 MG tablet Commonly known as: FLAGYL Take 1 tablet (500 mg total) by mouth every 12 (twelve) hours for 4 days.   nystatin powder Commonly known as: MYCOSTATIN/NYSTOP Apply 1 application topically daily as needed.   sacubitril-valsartan 97-103 MG Commonly known as: ENTRESTO Take 1 tablet by mouth 2 (two) times daily. Replaces: Entresto 49-51 MG   spironolactone 25 MG tablet Commonly known as: ALDACTONE Take 0.5 tablets (12.5 mg total) by mouth daily. Start taking on: September 16, 2020   traMADol 50 MG tablet Commonly known as: ULTRAM Take 1 tablet (50 mg total) by mouth every 8 (eight) hours as needed for up to 3 days. What changed: when to take this   vitamin B-12 100 MCG tablet Commonly known as: CYANOCOBALAMIN Take 100 mcg by mouth every  other day.   vitamin C 250 MG tablet Commonly known as: ASCORBIC ACID Take 250 mg by mouth every other day.         DISCHARGE INSTRUCTIONS:   Follow-up team at rehab 1 day  If you experience worsening of your admission symptoms, develop shortness of breath, life threatening emergency, suicidal or homicidal thoughts you must seek medical attention immediately by calling 911 or calling your MD immediately  if symptoms less severe.  You Must read complete instructions/literature along with all the possible adverse reactions/side effects for all the Medicines you take and that have been prescribed to you. Take any new Medicines after you have completely understood and accept all the possible adverse reactions/side effects.   Please note  You were cared for by a hospitalist during your hospital stay. If you have any questions about your discharge medications or the care you received while you were in the hospital after you are discharged, you can call the unit and asked to speak with the hospitalist on call if  the hospitalist that took care of you is not available. Once you are discharged, your primary care physician will handle any further medical issues. Please note that NO REFILLS for any discharge medications will be authorized once you are discharged, as it is imperative that you return to your primary care physician (or establish a relationship with a primary care physician if you do not have one) for your aftercare needs so that they can reassess your need for medications and monitor your lab values.    Today   CHIEF COMPLAINT:   Chief Complaint  Patient presents with  . Leg Swelling    HISTORY OF PRESENT ILLNESS:  Grace Short  is a 75 y.o. female came in with leg swelling and found to have bilateral lower extremity cellulitis   VITAL SIGNS:  Blood pressure (!) 157/54, pulse (!) 59, temperature 98 F (36.7 C), temperature source Oral, resp. rate 19, height  (1.575 m), weight (!) 149.1 kg, SpO2 93 %.  I/O:   Intake/Output Summary (Last 24 hours) at 09/15/2020 1147 Last data filed at 09/15/2020 0338 Gross per 24 hour  Intake --  Output 700 ml  Net -700 ml    PHYSICAL EXAMINATION:  GENERAL:  75 y.o.-year-old patient lying in the bed with no acute distress.  EYES: Pupils equal, round, reactive to light and accommodation. No scleral icterus. HEENT: Head atraumatic, normocephalic. Oropharynx and nasopharynx clear.  LUNGS: Normal breath sounds bilaterally, no wheezing, rales,rhonchi or crepitation. No use of accessory muscles of respiration.  CARDIOVASCULAR: S1, S2 normal. No murmurs, rubs, or gallops.  ABDOMEN: Soft, non-tender, non-distended. EXTREMITIES: 3+ pedal edema.  NEUROLOGIC: Cranial nerves II through XII are intact.  PSYCHIATRIC: The patient is alert and answers simple yes or no questions.  SKIN: Lower legs covered.  DATA REVIEW:   CBC Recent Labs  Lab 09/14/20 0659  WBC 7.1  HGB 10.6*  HCT 33.6*  PLT 270    Chemistries  Recent Labs  Lab 09/09/20 0422  09/11/20 0556 09/14/20 0540  NA 139   < > 140  K 3.7   < > 4.2  CL 102   < > 102  CO2 30   < > 31  GLUCOSE 163*   < > 116*  BUN 7*   < > 10  CREATININE 0.61   < > 0.58  CALCIUM 8.1*   < > 8.2*  MG 1.7   < > 1.9  AST 24  --   --  ALT 12  --   --   ALKPHOS 45  --   --   BILITOT 0.6  --   --    < > = values in this interval not displayed.     Microbiology Results  Results for orders placed or performed during the hospital encounter of 09/06/20  Blood Culture (routine x 2)     Status: None   Collection Time: 09/06/20  6:18 PM   Specimen: BLOOD  Result Value Ref Range Status   Specimen Description BLOOD RIGHT ANTECUBITAL  Final   Special Requests   Final    BOTTLES DRAWN AEROBIC AND ANAEROBIC Blood Culture adequate volume   Culture   Final    NO GROWTH 5 DAYS Performed at [facility], [address], [place], [place] [address]    Report Status 09/12/2020 FINAL  Final  Blood Culture (routine x 2)     Status: None   Collection Time: 09/06/20  6:18 PM   Specimen: BLOOD  Result Value Ref Range Status   Specimen Description BLOOD BLOOD LEFT WRIST  Final   Special Requests   Final    BOTTLES DRAWN AEROBIC AND ANAEROBIC Blood Culture adequate volume   Culture   Final    NO GROWTH 5 DAYS Performed at [facility], [address] [place], [place] [address]    Report Status 09/12/2020 FINAL  Final  Resp Panel by RT-PCR (Flu A&B, Covid) Nasopharyngeal Swab     Status: None   Collection Time: 09/07/20  1:37 AM   Specimen: Nasopharyngeal Swab; Nasopharyngeal(NP) swabs in vial transport medium  Result Value Ref Range Status   SARS Coronavirus 2 by RT PCR NEGATIVE NEGATIVE Final    Comment: (NOTE) SARS-CoV-2 target nucleic acids are NOT DETECTED.  The SARS-CoV-2 RNA is generally detectable in upper respiratory specimens during the acute phase of infection. The lowest concentration of SARS-CoV-2 viral copies this assay can detect is 138 copies/mL. A negative  result does not preclude SARS-Cov-2 infection and should not be used as the sole basis for treatment or other patient management decisions. A negative result may occur with  improper specimen collection/handling, submission of specimen other than nasopharyngeal swab, presence of viral mutation(s) within the areas targeted by this assay, and inadequate number of viral copies(<138 copies/mL). A negative result must be combined with clinical observations, patient history, and epidemiological information. The expected result is Negative.  Fact Sheet for Patients:  BloggerCourse.com  Fact Sheet for Healthcare Providers:  SeriousBroker.it  This test is no t yet approved or cleared by the [place] FDA and  has been authorized for detection and/or diagnosis of SARS-CoV-2 by FDA under an Emergency Use Authorization (EUA). This EUA will remain  in effect (meaning this test can be used) for the duration of the COVID-19 declaration under Section 564(b)(1) of the Act, 21 U.S.C.section 360bbb-3(b)(1), unless the authorization is terminated  or revoked sooner.       Influenza A by PCR NEGATIVE NEGATIVE Final   Influenza B by PCR NEGATIVE NEGATIVE Final    Comment: (NOTE) The Xpert Xpress SARS-CoV-2/FLU/RSV plus assay is intended as an aid in the diagnosis of influenza from Nasopharyngeal swab specimens and should not be used as a sole basis for treatment. Nasal washings and aspirates are unacceptable for Xpert Xpress SARS-CoV-2/FLU/RSV testing.  Fact Sheet for Patients: BloggerCourse.com  Fact Sheet for Healthcare Providers: SeriousBroker.it  This test is not yet approved or cleared by the [place] FDA and has  been authorized for detection and/or diagnosis of SARS-CoV-2 by FDA under an Emergency Use Authorization (EUA). This EUA will remain in effect (meaning this test can be used)  for the duration of the COVID-19 declaration under Section 564(b)(1) of the Act, 21 U.S.C. section 360bbb-3(b)(1), unless the authorization is terminated or revoked.  Performed at [facility], [address] [place], [place] [address]   Urine culture     Status: Abnormal   Collection Time: 09/07/20  6:38 AM   Specimen: In/Out Cath Urine  Result Value Ref Range Status   Specimen Description   Final    IN/OUT CATH URINE Performed at [facility], [address] [place], [place] [address]    Special Requests   Final    NONE Performed at [facility], [address] [place], [place] [address]    Culture (A)  Final    20,000 COLONIES/mL STAPHYLOCOCCUS AUREUS 80,000 COLONIES/mL DIPHTHEROIDS(CORYNEBACTERIUM SPECIES) Standardized susceptibility testing for this organism is not available. Performed at [facility], [address] [place], [place] [address]    Report Status 09/09/2020 FINAL  Final   Organism ID, Bacteria STAPHYLOCOCCUS AUREUS (A)  Final      Susceptibility   Staphylococcus aureus - MIC*    CIPROFLOXACIN <=0.5 SENSITIVE Sensitive     GENTAMICIN <=0.5 SENSITIVE Sensitive     NITROFURANTOIN 32 SENSITIVE Sensitive     OXACILLIN 0.5 SENSITIVE Sensitive     TETRACYCLINE <=1 SENSITIVE Sensitive     VANCOMYCIN 1 SENSITIVE Sensitive     TRIMETH/SULFA <=10 SENSITIVE Sensitive     CLINDAMYCIN <=0.25 SENSITIVE Sensitive     RIFAMPIN <=0.5 SENSITIVE Sensitive     Inducible Clindamycin NEGATIVE Sensitive     * 20,000 COLONIES/mL STAPHYLOCOCCUS AUREUS  Resp Panel by RT-PCR (Flu A&B, Covid) Nasopharyngeal Swab     Status: None   Collection Time: 09/12/20  9:20 AM   Specimen: Nasopharyngeal Swab; Nasopharyngeal(NP) swabs in vial transport medium  Result Value Ref Range Status   SARS Coronavirus 2 by RT PCR NEGATIVE NEGATIVE Final    Comment: (NOTE) SARS-CoV-2 target nucleic acids are NOT DETECTED.  The SARS-CoV-2 RNA is generally  detectable in upper respiratory specimens during the acute phase of infection. The lowest concentration of SARS-CoV-2 viral copies this assay can detect is 138 copies/mL. A negative result does not preclude SARS-Cov-2 infection and should not be used as the sole basis for treatment or other patient management decisions. A negative result may occur with  improper specimen collection/handling, submission of specimen other than nasopharyngeal swab, presence of viral mutation(s) within the areas targeted by this assay, and inadequate number of viral copies(<138 copies/mL). A negative result must be combined with clinical observations, patient history, and epidemiological information. The expected result is Negative.  Fact Sheet for Patients:  BloggerCourse.comhttps://www.fda.gov/media/152166/download  Fact Sheet for Healthcare Providers:  SeriousBroker.ithttps://www.fda.gov/media/152162/download  This test is no t yet approved or cleared by the [place] FDA and  has been authorized for detection and/or diagnosis of SARS-CoV-2 by FDA under an Emergency Use Authorization (EUA). This EUA will remain  in effect (meaning this test can be used) for the duration of the COVID-19 declaration under Section 564(b)(1) of the Act, 21 U.S.C.section 360bbb-3(b)(1), unless the authorization is terminated  or revoked sooner.       Influenza A by PCR NEGATIVE NEGATIVE Final   Influenza B by PCR NEGATIVE NEGATIVE Final    Comment: (NOTE) The Xpert Xpress SARS-CoV-2/FLU/RSV plus assay is intended  as an aid in the diagnosis of influenza from Nasopharyngeal swab specimens and should not be used as a sole basis for treatment. Nasal washings and aspirates are unacceptable for Xpert Xpress SARS-CoV-2/FLU/RSV testing.  Fact Sheet for Patients: BloggerCourse.com  Fact Sheet for Healthcare Providers: SeriousBroker.it  This test is not yet approved or cleared by the [place] FDA  and has been authorized for detection and/or diagnosis of SARS-CoV-2 by FDA under an Emergency Use Authorization (EUA). This EUA will remain in effect (meaning this test can be used) for the duration of the COVID-19 declaration under Section 564(b)(1) of the Act, 21 U.S.C. section 360bbb-3(b)(1), unless the authorization is terminated or revoked.  Performed at [facility], [address] [place], [place] [address]   Resp Panel by RT-PCR (Flu A&B, Covid) Nasopharyngeal Swab     Status: None   Collection Time: 09/15/20  9:20 AM   Specimen: Nasopharyngeal Swab; Nasopharyngeal(NP) swabs in vial transport medium  Result Value Ref Range Status   SARS Coronavirus 2 by RT PCR NEGATIVE NEGATIVE Final    Comment: (NOTE) SARS-CoV-2 target nucleic acids are NOT DETECTED.  The SARS-CoV-2 RNA is generally detectable in upper respiratory specimens during the acute phase of infection. The lowest concentration of SARS-CoV-2 viral copies this assay can detect is 138 copies/mL. A negative result does not preclude SARS-Cov-2 infection and should not be used as the sole basis for treatment or other patient management decisions. A negative result may occur with  improper specimen collection/handling, submission of specimen other than nasopharyngeal swab, presence of viral mutation(s) within the areas targeted by this assay, and inadequate number of viral copies(<138 copies/mL). A negative result must be combined with clinical observations, patient history, and epidemiological information. The expected result is Negative.  Fact Sheet for Patients:  BloggerCourse.com  Fact Sheet for Healthcare Providers:  SeriousBroker.it  This test is no t yet approved or cleared by the [place] FDA and  has been authorized for detection and/or diagnosis of SARS-CoV-2 by FDA under an Emergency Use Authorization (EUA). This EUA will remain  in effect  (meaning this test can be used) for the duration of the COVID-19 declaration under Section 564(b)(1) of the Act, 21 U.S.C.section 360bbb-3(b)(1), unless the authorization is terminated  or revoked sooner.       Influenza A by PCR NEGATIVE NEGATIVE Final   Influenza B by PCR NEGATIVE NEGATIVE Final    Comment: (NOTE) The Xpert Xpress SARS-CoV-2/FLU/RSV plus assay is intended as an aid in the diagnosis of influenza from Nasopharyngeal swab specimens and should not be used as a sole basis for treatment. Nasal washings and aspirates are unacceptable for Xpert Xpress SARS-CoV-2/FLU/RSV testing.  Fact Sheet for Patients: BloggerCourse.com  Fact Sheet for Healthcare Providers: SeriousBroker.it  This test is not yet approved or cleared by the [place] FDA and has been authorized for detection and/or diagnosis of SARS-CoV-2 by FDA under an Emergency Use Authorization (EUA). This EUA will remain in effect (meaning this test can be used) for the duration of the COVID-19 declaration under Section 564(b)(1) of the Act, 21 U.S.C. section 360bbb-3(b)(1), unless the authorization is terminated or revoked.  Performed at [facility], [address], [place], [place] [address]      Management plans discussed with the patient, family and they are in agreement.  CODE STATUS:     Code Status Orders  (From admission, onward)           Start  Ordered   09/07/20 0125  Full code  Continuous        09/07/20 0136           Code Status History     Date Active Date Inactive Code Status Order ID Comments User Context   12/07/2019 1742 12/15/2019 0138 Full Code [id number]  Jacques Navy, MD ED       TOTAL TIME TAKING CARE OF THIS PATIENT: 35 minutes.    Alford Highland M.D on 09/15/2020 at 11:47 AM  Between 7am to 6pm - Pager - [phone]  After 6pm go to [email or web address] - password [email or web address]  [facility]  CC: Primary care physician; Shane Crutch, PA

## 2020-10-20 ENCOUNTER — Ambulatory Visit (INDEPENDENT_AMBULATORY_CARE_PROVIDER_SITE_OTHER): Payer: 59 | Admitting: Nurse Practitioner

## 2020-10-21 IMAGING — DX DG CHEST 1V PORT
1 series · 1 of 1 positions shown · non-contrast
Comparison: Chest CTA 12/07/2019 and earlier.

CLINICAL DATA: 74-year-old female positive [id number]. Increased
cough.

EXAM:
PORTABLE CHEST 1 VIEW

[chest ap]
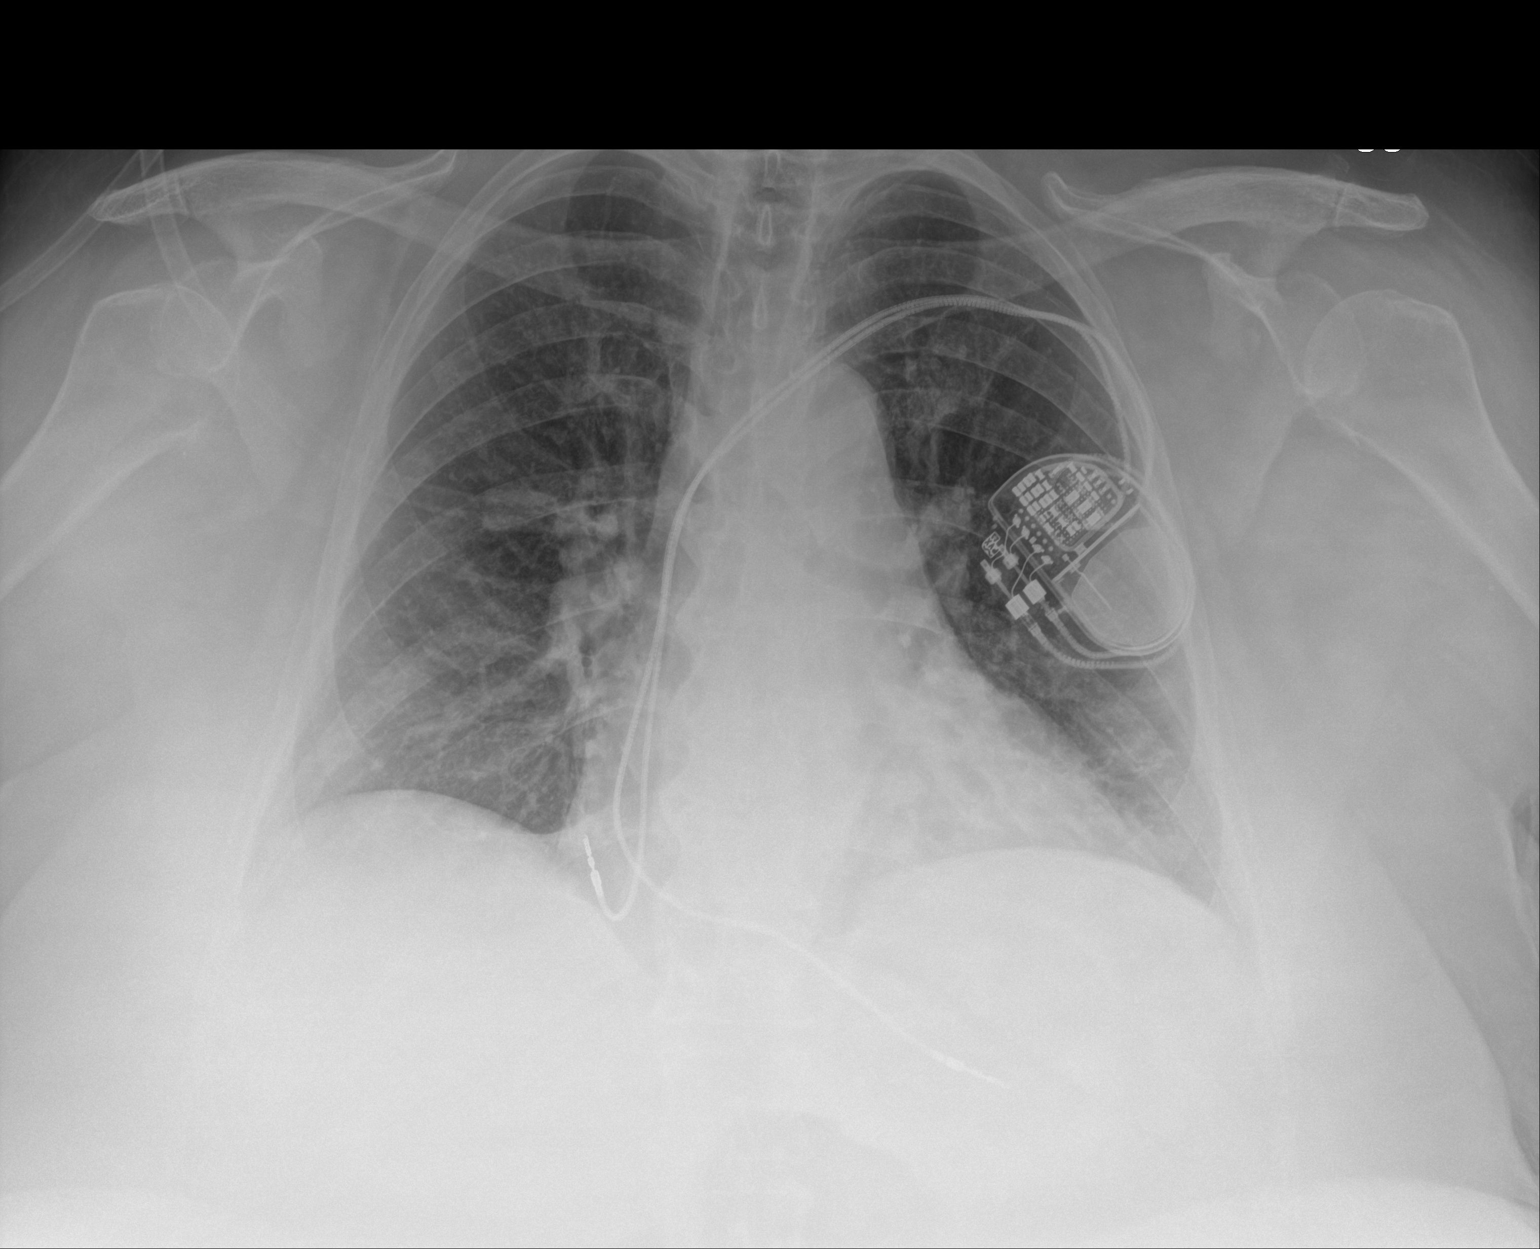

[1 of 1 positions shown; findings below may reference images not displayed]

FINDINGS: Portable AP upright view at 9155 hours. Low normal lung volumes.
Mediastinal contours remain normal. Stable left chest cardiac
pacemaker.

E minor asymmetric streaky opacity, appears at the left lung stable
to base the recent left lower lobe CTA opacity. Lsewhere allowing
for portable technique the lungs are clear. Visualized tracheal air
column is within normal limits. No pneumothorax or pleural effusion.
No acute osseous abnormality identified.
IMPRESSION: Stable mild left lung base opacity from the CTA on 12/07/2019. No
new cardiopulmonary abnormality.

## 2020-10-27 ENCOUNTER — Ambulatory Visit (INDEPENDENT_AMBULATORY_CARE_PROVIDER_SITE_OTHER): Payer: 59 | Admitting: Nurse Practitioner

## 2020-11-04 ENCOUNTER — Encounter (INDEPENDENT_AMBULATORY_CARE_PROVIDER_SITE_OTHER): Payer: Self-pay | Admitting: Nurse Practitioner

## 2020-11-04 ENCOUNTER — Ambulatory Visit (INDEPENDENT_AMBULATORY_CARE_PROVIDER_SITE_OTHER): Payer: 59 | Admitting: Nurse Practitioner

## 2020-11-10 ENCOUNTER — Encounter (INDEPENDENT_AMBULATORY_CARE_PROVIDER_SITE_OTHER): Payer: Self-pay | Admitting: Nurse Practitioner

## 2020-11-10 ENCOUNTER — Other Ambulatory Visit: Payer: Self-pay

## 2020-11-10 ENCOUNTER — Ambulatory Visit (INDEPENDENT_AMBULATORY_CARE_PROVIDER_SITE_OTHER): Payer: 59 | Admitting: Nurse Practitioner

## 2020-11-10 VITALS — BP 134/64 | HR 61 | Ht 62.0 in | Wt 300.0 lb

## 2020-11-10 DIAGNOSIS — E1169 Type 2 diabetes mellitus with other specified complication: Secondary | ICD-10-CM | POA: Diagnosis not present

## 2020-11-10 DIAGNOSIS — I890 Lymphedema, not elsewhere classified: Secondary | ICD-10-CM | POA: Diagnosis not present

## 2020-11-10 DIAGNOSIS — L03115 Cellulitis of right lower limb: Secondary | ICD-10-CM | POA: Diagnosis not present

## 2020-11-10 DIAGNOSIS — I5032 Chronic diastolic (congestive) heart failure: Secondary | ICD-10-CM

## 2020-11-10 DIAGNOSIS — L03116 Cellulitis of left lower limb: Secondary | ICD-10-CM

## 2020-11-10 DIAGNOSIS — E785 Hyperlipidemia, unspecified: Secondary | ICD-10-CM

## 2020-11-10 NOTE — Progress Notes (Signed)
Subjective:    Patient ID: Grace Short. Pixler, female    DOB: 09/24/1945, 75 y.o.   MRN: [id number] Chief Complaint  Patient presents with  . Follow-up    6 Mo no studies    Grace Short is a 75 year old female that returns to the office for followup evaluation regarding leg swelling.  The swelling has persisted and the pain associated with swelling continues. There have not been any interval development of a ulcerations or wounds.  Since the previous visit the patient has not been wearing graduated compression stockings and has noted little if any improvement in the lymphedema. The patient has not been using compression routinely morning until night.  The patient has recently been moved to a facility and she has not had little wraps or compression since she started there.     Review of Systems  Cardiovascular:  Positive for leg swelling.  Neurological:  Positive for weakness.  All other systems reviewed and are negative.     Objective:   Physical Exam Vitals reviewed.  Constitutional:      Appearance: She is obese.  HENT:     Head: Normocephalic.  Cardiovascular:     Rate and Rhythm: Normal rate.  Pulmonary:     Effort: Pulmonary effort is normal.  Musculoskeletal:     Right lower leg: 3+ Edema present.     Left lower leg: 3+ Edema present.  Skin:    General: Skin is warm and dry.  Neurological:     Mental Status: She is oriented to person, place, and time.  Psychiatric:        Mood and Affect: Mood normal.        Behavior: Behavior normal.        Thought Content: Thought content normal.        Judgment: Judgment normal.    BP 134/64   Pulse 61   Ht 5\' 2"  (1.575 m)   Wt 300 lb (136.1 kg)   BMI 54.87 kg/m   Past Medical History:  Diagnosis Date  . Chronic acquired lymphedema   . Chronic heart failure with preserved ejection fraction (HFpEF) (HCC)   . COPD (chronic obstructive pulmonary disease) (HCC)   . CVA (cerebral vascular accident) (HCC)   . DM (diabetes  mellitus) (HCC)   . HLD (hyperlipidemia)   . Hypertension   . Morbid obesity with BMI of 50.0-59.9, adult (HCC)   . Partial seizure disorder (HCC)   . SSS (sick sinus syndrome) (HCC)     Social History   Socioeconomic History  . Marital status: Single    Spouse name: Not on file  . Number of children: Not on file  . Years of education: Not on file  . Highest education level: Not on file  Occupational History  . Not on file  Tobacco Use  . Smoking status: Never  . Smokeless tobacco: Never  Vaping Use  . Vaping Use: Never used  Substance and Sexual Activity  . Alcohol use: Never  . Drug use: Never  . Sexual activity: Not on file  Other Topics Concern  . Not on file  Social History Narrative  . Not on file   Social Determinants of Health   Financial Resource Strain: Not on file  Food Insecurity: Not on file  Transportation Needs: Not on file  Physical Activity: Not on file  Stress: Not on file  Social Connections: Not on file  Intimate Partner Violence: Not on file    Past Surgical  History:  Procedure Laterality Date  . APPENDECTOMY    . BREAST SURGERY    . colectomy     rectal surgery  . HERNIA REPAIR    . PACEMAKER IMPLANT    . TONSILLECTOMY      Family History  Problem Relation Age of Onset  . Diabetes Sister   . Heart disease Sister     Allergies  Allergen Reactions  . Ativan [Lorazepam]     CBC Latest Ref Rng & Units 09/14/2020 09/11/2020 09/10/2020  WBC 4.0 - 10.5 K/uL 7.1 11.7(H) 13.6(H)  Hemoglobin 12.0 - 15.0 g/dL 10.6(L) 10.7(L) 11.4(L)  Hematocrit 36.0 - 46.0 % 33.6(L) 34.3(L) 36.0  Platelets 150 - 400 K/uL 270 257 239      CMP     Component Value Date/Time   NA 140 09/14/2020 0540   K 4.2 09/14/2020 0540   CL 102 09/14/2020 0540   CO2 31 09/14/2020 0540   GLUCOSE 116 (H) 09/14/2020 0540   BUN 10 09/14/2020 0540   CREATININE 0.58 09/14/2020 0540   CALCIUM 8.2 (L) 09/14/2020 0540   PROT 6.1 (L) 09/09/2020 0422   ALBUMIN 2.6 (L)  09/09/2020 0422   AST 24 09/09/2020 0422   ALT 12 09/09/2020 0422   ALKPHOS 45 09/09/2020 0422   BILITOT 0.6 09/09/2020 0422   GFRNONAA >60 09/14/2020 0540   GFRAA >60 12/10/2019 0421     No results found.     Assessment & Plan:   1. Lymphedema Recommend:  No surgery or intervention at this point in time.    I have reviewed my previous discussion with the patient regarding swelling and why it causes symptoms.  Patient will continue wearing graduated compression stockings class 1 (20-30 mmHg) on a daily basis. The patient will  beginning wearing the stockings first thing in the morning and removing them in the evening. The patient is instructed specifically not to sleep in the stockings.    In addition, behavioral modification including several periods of elevation of the lower extremities during the day will be continued.  This was reviewed with the patient during the initial visit.  The patient will also continue routine exercise, especially walking on a daily basis as was discussed during the initial visit.    Despite conservative treatments including graduated compression therapy class 1 and behavioral modification including exercise and elevation the patient  has not obtained adequate control of the lymphedema.  The patient still has stage 3 lymphedema and therefore, I believe that a lymph pump should be added to improve the control of the patient's lymphedema.  Additionally, a lymph pump is warranted because it will reduce the risk of cellulitis and ulceration in the future.  Patient should follow-up in six months    2. Cellulitis of both lower extremities  Cellulitis is resolved at this time.  3. Type 2 diabetes mellitus with hyperlipidemia (HCC) Continue hypoglycemic medications as already ordered, these medications have been reviewed and there are no changes at this time.  Hgb A1C to be monitored as already arranged by primary service   4. Chronic diastolic CHF  (congestive heart failure) (HCC) This likely contributes to the patient's lower extremity edema   Current Outpatient Medications on File Prior to Visit  Medication Sig Dispense Refill  . acetaminophen (TYLENOL) 325 MG tablet Take 650 mg by mouth 2 (two) times daily as needed.    Marland Kitchen albuterol (VENTOLIN HFA) 108 (90 Base) MCG/ACT inhaler Inhale 2 puffs into the lungs once as needed  for wheezing or shortness of breath (for Grade 3 or 4 hypersensitivity reaction).    Marland Kitchen atorvastatin (LIPITOR) 20 MG tablet Take 20 mg by mouth daily.    . carvedilol (COREG) 12.5 MG tablet Take 1 tablet (12.5 mg total) by mouth 2 (two) times daily with a meal. 60 tablet 0  . cholecalciferol (VITAMIN D3) 25 MCG (1000 UNIT) tablet Take 1,000 Units by mouth every other day.    . dorzolamide-timolol (COSOPT) 22.3-6.8 MG/ML ophthalmic solution Place 1 drop into both eyes 2 (two) times daily.    . furosemide (LASIX) 40 MG tablet Take 1 tablet (40 mg total) by mouth daily. 30 tablet   . hydrocerin (EUCERIN) CREA Apply 1 application topically daily. 454 g 0  . insulin glargine (LANTUS) 100 UNIT/ML injection Inject 0.13 mLs (13 Units total) into the skin 2 (two) times daily. 10 mL 11  . insulin lispro (HUMALOG) 100 UNIT/ML injection Inject 0.06 mLs (6 Units total) into the skin 3 (three) times daily with meals. Inject 10u under the skin three times daily at mealtimes (plus applicable sliding scale) - do not use if blood glucose <150 10 mL 11  . levETIRAcetam (KEPPRA) 750 MG tablet Take 750 mg by mouth 2 (two) times daily.    Marland Kitchen linagliptin (TRADJENTA) 5 MG TABS tablet Take 5 mg by mouth daily.    Marland Kitchen loratadine (CLARITIN) 10 MG tablet Take 10 mg by mouth daily as needed for allergies.    . metoprolol succinate (TOPROL-XL) 25 MG 24 hr tablet Take by mouth.    . nystatin (MYCOSTATIN/NYSTOP) powder Apply 1 application topically daily as needed.    . sacubitril-valsartan (ENTRESTO) 97-103 MG Take 1 tablet by mouth 2 (two) times daily.  60 tablet 0  . spironolactone (ALDACTONE) 25 MG tablet Take 0.5 tablets (12.5 mg total) by mouth daily. 30 tablet 0  . vitamin B-12 (CYANOCOBALAMIN) 100 MCG tablet Take 100 mcg by mouth every other day.    . vitamin C (ASCORBIC ACID) 250 MG tablet Take 250 mg by mouth every other day.    . cholestyramine (QUESTRAN) 4 g packet Take 1 packet (4 g total) by mouth 2 (two) times daily for 7 days. 14 each 0   No current facility-administered medications on file prior to visit.    There are no Patient Instructions on file for this visit. No follow-ups on file.   Georgiana Spinner, NP

## 2021-03-03 ENCOUNTER — Ambulatory Visit (INDEPENDENT_AMBULATORY_CARE_PROVIDER_SITE_OTHER): Payer: 59 | Admitting: Nurse Practitioner

## 2021-03-03 ENCOUNTER — Other Ambulatory Visit: Payer: Self-pay

## 2021-03-03 VITALS — BP 162/85 | HR 60 | Ht 62.0 in | Wt 283.0 lb

## 2021-03-03 DIAGNOSIS — I890 Lymphedema, not elsewhere classified: Secondary | ICD-10-CM

## 2021-03-03 NOTE — Progress Notes (Signed)
History of Present Illness  There is no documented history at this time  Assessments & Plan   There are no diagnoses linked to this encounter.    Additional instructions  Subjective:  Patient presents with venous ulcer of the Bilateral lower extremity.    Procedure:  3 layer unna wrap was placed Bilateral lower extremity.   Plan:   Follow up in one week.  

## 2021-03-08 ENCOUNTER — Encounter (INDEPENDENT_AMBULATORY_CARE_PROVIDER_SITE_OTHER): Payer: Self-pay | Admitting: Nurse Practitioner

## 2021-03-10 ENCOUNTER — Ambulatory Visit (INDEPENDENT_AMBULATORY_CARE_PROVIDER_SITE_OTHER): Payer: 59 | Admitting: Nurse Practitioner

## 2021-03-10 ENCOUNTER — Encounter (INDEPENDENT_AMBULATORY_CARE_PROVIDER_SITE_OTHER): Payer: Self-pay

## 2021-03-10 VITALS — BP 148/82 | HR 60 | Resp 16

## 2021-03-10 DIAGNOSIS — I890 Lymphedema, not elsewhere classified: Secondary | ICD-10-CM

## 2021-03-10 NOTE — Progress Notes (Signed)
History of Present Illness  There is no documented history at this time  Assessments & Plan   There are no diagnoses linked to this encounter.    Additional instructions  Subjective:  Patient presents with venous ulcer of the Bilateral lower extremity.    Procedure:  3 layer unna wrap was placed Bilateral lower extremity.   Plan:   Follow up in one week.  

## 2021-03-14 ENCOUNTER — Encounter (INDEPENDENT_AMBULATORY_CARE_PROVIDER_SITE_OTHER): Payer: Self-pay | Admitting: Nurse Practitioner

## 2021-03-19 ENCOUNTER — Encounter (INDEPENDENT_AMBULATORY_CARE_PROVIDER_SITE_OTHER): Payer: Self-pay | Admitting: Nurse Practitioner

## 2021-03-19 ENCOUNTER — Ambulatory Visit (INDEPENDENT_AMBULATORY_CARE_PROVIDER_SITE_OTHER): Payer: 59 | Admitting: Nurse Practitioner

## 2021-03-19 ENCOUNTER — Other Ambulatory Visit: Payer: Self-pay

## 2021-03-19 VITALS — BP 123/73 | HR 61 | Resp 16

## 2021-03-19 DIAGNOSIS — L03115 Cellulitis of right lower limb: Secondary | ICD-10-CM

## 2021-03-19 DIAGNOSIS — E785 Hyperlipidemia, unspecified: Secondary | ICD-10-CM

## 2021-03-19 DIAGNOSIS — I10 Essential (primary) hypertension: Secondary | ICD-10-CM | POA: Diagnosis not present

## 2021-03-19 DIAGNOSIS — I890 Lymphedema, not elsewhere classified: Secondary | ICD-10-CM

## 2021-03-19 DIAGNOSIS — E1169 Type 2 diabetes mellitus with other specified complication: Secondary | ICD-10-CM

## 2021-03-19 DIAGNOSIS — L03116 Cellulitis of left lower limb: Secondary | ICD-10-CM

## 2021-03-28 ENCOUNTER — Encounter (INDEPENDENT_AMBULATORY_CARE_PROVIDER_SITE_OTHER): Payer: Self-pay | Admitting: Nurse Practitioner

## 2021-03-28 NOTE — Progress Notes (Signed)
Subjective:    Patient ID: Grace Short. Grace Short, female    DOB: 11-21-1945, 76 y.o.   MRN: [id number] Chief Complaint  Patient presents with   Follow-up    Grace Short boot check    Grace Short is a 76 year old female that returns to the office for followup evaluation regarding leg swelling.  The swelling has persisted and the pain associated with swelling continues. There have not been any interval development of a ulcerations or wounds.  Since the previous visit the patient has been wearing Unna boots and has noted little if any improvement in the lymphedema. The patient has been using compression routinely morning until night.  The patient notes that elevation is being done but very little exercise is being done.     Review of Systems  Cardiovascular:  Positive for leg swelling.  All other systems reviewed and are negative.     Objective:   Physical Exam Vitals reviewed.  HENT:     Head: Normocephalic.  Cardiovascular:     Rate and Rhythm: Normal rate.  Pulmonary:     Effort: Pulmonary effort is normal.  Skin:    General: Skin is warm and dry.  Neurological:     Mental Status: She is alert and oriented to person, place, and time.     Motor: Weakness present.     Gait: Gait abnormal.  Psychiatric:        Mood and Affect: Mood normal.        Behavior: Behavior normal.        Thought Content: Thought content normal.        Judgment: Judgment normal.    BP 123/73 (BP Location: Left Arm)    Pulse 61    Resp 16   Past Medical History:  Diagnosis Date   Chronic acquired lymphedema    Chronic heart failure with preserved ejection fraction (HFpEF) (HCC)    COPD (chronic obstructive pulmonary disease) (HCC)    CVA (cerebral vascular accident) (HCC)    DM (diabetes mellitus) (HCC)    HLD (hyperlipidemia)    Hypertension    Morbid obesity with BMI of 50.0-59.9, adult (HCC)    Partial seizure disorder (HCC)    SSS (sick sinus syndrome) (HCC)     Social History   Socioeconomic  History   Marital status: Single    Spouse name: Not on file   Number of children: Not on file   Years of education: Not on file   Highest education level: Not on file  Occupational History   Not on file  Tobacco Use   Smoking status: Never   Smokeless tobacco: Never  Vaping Use   Vaping Use: Never used  Substance and Sexual Activity   Alcohol use: Never   Drug use: Never   Sexual activity: Not on file  Other Topics Concern   Not on file  Social History Narrative   Not on file   Social Determinants of Health   Financial Resource Strain: Not on file  Food Insecurity: Not on file  Transportation Needs: Not on file  Physical Activity: Not on file  Stress: Not on file  Social Connections: Not on file  Intimate Partner Violence: Not on file    Past Surgical History:  Procedure Laterality Date   APPENDECTOMY     BREAST SURGERY     colectomy     rectal surgery   HERNIA REPAIR     PACEMAKER IMPLANT     TONSILLECTOMY  Family History  Problem Relation Age of Onset   Diabetes Sister    Heart disease Sister     Allergies  Allergen Reactions   Ativan [Lorazepam]     CBC Latest Ref Rng & Units 09/14/2020 09/11/2020 09/10/2020  WBC 4.0 - 10.5 K/uL 7.1 11.7(H) 13.6(H)  Hemoglobin 12.0 - 15.0 g/dL 10.6(L) 10.7(L) 11.4(L)  Hematocrit 36.0 - 46.0 % 33.6(L) 34.3(L) 36.0  Platelets 150 - 400 K/uL 270 257 239      CMP     Component Value Date/Time   NA 140 09/14/2020 0540   K 4.2 09/14/2020 0540   CL 102 09/14/2020 0540   CO2 31 09/14/2020 0540   GLUCOSE 116 (H) 09/14/2020 0540   BUN 10 09/14/2020 0540   CREATININE 0.58 09/14/2020 0540   CALCIUM 8.2 (L) 09/14/2020 0540   PROT 6.1 (L) 09/09/2020 0422   ALBUMIN 2.6 (L) 09/09/2020 0422   AST 24 09/09/2020 0422   ALT 12 09/09/2020 0422   ALKPHOS 45 09/09/2020 0422   BILITOT 0.6 09/09/2020 0422   GFRNONAA >60 09/14/2020 0540   GFRAA >60 12/10/2019 0421     No results found.     Assessment & Plan:   1.  Lymphedema The patient's lymphedema is somewhat difficult to control due to her being in a facility and conservative therapy not being closely followed at all times.  We will send orders for the patient to be placed in [place] wraps to be changed on a weekly basis.  She is also advised to elevate is much as possible and to be active when possible as well.  Otherwise we will have the patient return to the office in 4 weeks for evaluation.  2. Cellulitis of both lower extremities Currently resolved  3. Type 2 diabetes mellitus with hyperlipidemia (HCC) Continue hypoglycemic medications as already ordered, these medications have been reviewed and there are no changes at this time.  Hgb A1C to be monitored as already arranged by primary service   4. Essential hypertension Continue antihypertensive medications as already ordered, these medications have been reviewed and there are no changes at this time.    Current Outpatient Medications on File Prior to Visit  Medication Sig Dispense Refill   acetaminophen (TYLENOL) 325 MG tablet Take 650 mg by mouth 2 (two) times daily as needed.     albuterol (VENTOLIN HFA) 108 (90 Base) MCG/ACT inhaler Inhale 2 puffs into the lungs once as needed for wheezing or shortness of breath (for Grade 3 or 4 hypersensitivity reaction).     atorvastatin (LIPITOR) 20 MG tablet Take 20 mg by mouth daily.     carvedilol (COREG) 12.5 MG tablet Take 1 tablet (12.5 mg total) by mouth 2 (two) times daily with a meal. 60 tablet 0   cholecalciferol (VITAMIN D3) 25 MCG (1000 UNIT) tablet Take 1,000 Units by mouth every other day.     dorzolamide-timolol (COSOPT) 22.3-6.8 MG/ML ophthalmic solution Place 1 drop into both eyes 2 (two) times daily.     furosemide (LASIX) 40 MG tablet Take 1 tablet (40 mg total) by mouth daily. 30 tablet    hydrocerin (EUCERIN) CREA Apply 1 application topically daily. 454 g 0   insulin glargine (LANTUS) 100 UNIT/ML injection Inject 0.13 mLs (13 Units  total) into the skin 2 (two) times daily. 10 mL 11   insulin lispro (HUMALOG) 100 UNIT/ML injection Inject 0.06 mLs (6 Units total) into the skin 3 (three) times daily with meals. Inject 10u under the skin  three times daily at mealtimes (plus applicable sliding scale) - do not use if blood glucose <150 10 mL 11   levETIRAcetam (KEPPRA) 750 MG tablet Take 750 mg by mouth 2 (two) times daily.     linagliptin (TRADJENTA) 5 MG TABS tablet Take 5 mg by mouth daily.     loratadine (CLARITIN) 10 MG tablet Take 10 mg by mouth daily as needed for allergies.     metoprolol succinate (TOPROL-XL) 25 MG 24 hr tablet Take by mouth.     nystatin (MYCOSTATIN/NYSTOP) powder Apply 1 application topically daily as needed.     sacubitril-valsartan (ENTRESTO) 97-103 MG Take 1 tablet by mouth 2 (two) times daily. 60 tablet 0   spironolactone (ALDACTONE) 25 MG tablet Take 0.5 tablets (12.5 mg total) by mouth daily. 30 tablet 0   vitamin B-12 (CYANOCOBALAMIN) 100 MCG tablet Take 100 mcg by mouth every other day.     vitamin C (ASCORBIC ACID) 250 MG tablet Take 250 mg by mouth every other day.     cholestyramine (QUESTRAN) 4 g packet Take 1 packet (4 g total) by mouth 2 (two) times daily for 7 days. 14 each 0   No current facility-administered medications on file prior to visit.    There are no Patient Instructions on file for this visit. No follow-ups on file.   Kris Hartmann, NP

## 2021-04-16 ENCOUNTER — Encounter (INDEPENDENT_AMBULATORY_CARE_PROVIDER_SITE_OTHER): Payer: 59 | Admitting: Nurse Practitioner

## 2021-05-10 ENCOUNTER — Ambulatory Visit (INDEPENDENT_AMBULATORY_CARE_PROVIDER_SITE_OTHER): Payer: 59 | Admitting: Nurse Practitioner

## 2021-07-29 DIAGNOSIS — Z23 Encounter for immunization: Secondary | ICD-10-CM | POA: Diagnosis not present

## 2022-04-02 ENCOUNTER — Other Ambulatory Visit: Payer: Self-pay

## 2022-04-02 ENCOUNTER — Emergency Department
Admission: EM | Admit: 2022-04-02 | Discharge: 2022-04-12 | Disposition: A | Discharge status: Home / Self Care | Payer: Medicare Other | Attending: Emergency Medicine | Admitting: Emergency Medicine

## 2022-04-02 DIAGNOSIS — R262 Difficulty in walking, not elsewhere classified: Secondary | ICD-10-CM

## 2022-04-02 DIAGNOSIS — L98491 Non-pressure chronic ulcer of skin of other sites limited to breakdown of skin: Secondary | ICD-10-CM | POA: Diagnosis not present

## 2022-04-02 DIAGNOSIS — Z4800 Encounter for change or removal of nonsurgical wound dressing: Secondary | ICD-10-CM | POA: Diagnosis not present

## 2022-04-02 DIAGNOSIS — E11622 Type 2 diabetes mellitus with other skin ulcer: Secondary | ICD-10-CM | POA: Diagnosis present

## 2022-04-02 DIAGNOSIS — R2689 Other abnormalities of gait and mobility: Secondary | ICD-10-CM | POA: Insufficient documentation

## 2022-04-02 DIAGNOSIS — L97911 Non-pressure chronic ulcer of unspecified part of right lower leg limited to breakdown of skin: Secondary | ICD-10-CM

## 2022-04-02 DIAGNOSIS — Z1152 Encounter for screening for COVID-19: Secondary | ICD-10-CM | POA: Diagnosis not present

## 2022-04-02 LAB — RESP PANEL BY RT-PCR (RSV, FLU A&B, COVID)  RVPGX2
Influenza A by PCR: NEGATIVE
Influenza B by PCR: NEGATIVE
Resp Syncytial Virus by PCR: NEGATIVE
SARS Coronavirus 2 by RT PCR: NEGATIVE

## 2022-04-02 LAB — COMPREHENSIVE METABOLIC PANEL
ALT: 15 U/L (ref 0–44)
AST: 31 U/L (ref 15–41)
Albumin: 3.5 g/dL (ref 3.5–5.0)
Alkaline Phosphatase: 47 U/L (ref 38–126)
Anion gap: 10 (ref 5–15)
BUN: 23 mg/dL (ref 8–23)
CO2: 26 mmol/L (ref 22–32)
Calcium: 8.8 mg/dL — ABNORMAL LOW (ref 8.9–10.3)
Chloride: 106 mmol/L (ref 98–111)
Creatinine, Ser: 0.83 mg/dL (ref 0.44–1.00)
GFR, Estimated: >60 mL/min (ref >60)
Glucose, Bld: 202 mg/dL — ABNORMAL HIGH (ref 70–99)
Potassium: 4.4 mmol/L (ref 3.5–5.1)
Sodium: 142 mmol/L (ref 135–145)
Total Bilirubin: 0.7 mg/dL (ref 0.3–1.2)
Total Protein: 7.5 g/dL (ref 6.5–8.1)

## 2022-04-02 LAB — CBC WITH DIFFERENTIAL/PLATELET
Abs Immature Granulocytes: 0.04 10*3/uL (ref 0.00–0.07)
Basophils Absolute: 0 10*3/uL (ref 0.0–0.1)
Basophils Relative: 0 %
Eosinophils Absolute: 0.1 10*3/uL (ref 0.0–0.5)
Eosinophils Relative: 1 %
HCT: 34 % — ABNORMAL LOW (ref 36.0–46.0)
Hemoglobin: 10.1 g/dL — ABNORMAL LOW (ref 12.0–15.0)
Immature Granulocytes: 0 %
Lymphocytes Relative: 16 %
Lymphs Abs: 1.8 10*3/uL (ref 0.7–4.0)
MCH: 26 pg (ref 26.0–34.0)
MCHC: 29.7 g/dL — ABNORMAL LOW (ref 30.0–36.0)
MCV: 87.4 fL (ref 80.0–100.0)
Monocytes Absolute: 0.9 10*3/uL (ref 0.1–1.0)
Monocytes Relative: 7 %
Neutro Abs: 9 10*3/uL — ABNORMAL HIGH (ref 1.7–7.7)
Neutrophils Relative %: 76 %
Platelets: 222 10*3/uL (ref 150–400)
RBC: 3.89 MIL/uL (ref 3.87–5.11)
RDW: 15.5 % (ref 11.5–15.5)
WBC: 11.8 10*3/uL — ABNORMAL HIGH (ref 4.0–10.5)
nRBC: 0 % (ref 0.0–0.2)

## 2022-04-02 LAB — TROPONIN I (HIGH SENSITIVITY): Troponin I (High Sensitivity): 22 ng/L — ABNORMAL HIGH (ref <18)

## 2022-04-02 LAB — LACTIC ACID, PLASMA: Lactic Acid, Venous: 1.2 mmol/L (ref 0.5–1.9)

## 2022-04-02 NOTE — ED Notes (Signed)
Pt placed in a hospital bed for her comfort as pt is now boarder status. Pt was changed as well.

## 2022-04-02 NOTE — ED Notes (Signed)
Report given to Travis RN

## 2022-04-02 NOTE — ED Triage Notes (Signed)
Patient here today for wound check of an ulcer on her RIGHT leg; She has lymphedema and per EMR notes from a virtual visit yesterday, she is unable to care for the wounds herself at home and was requesting to go to a SNF for wound care

## 2022-04-02 NOTE — ED Provider Notes (Signed)
[facility] Provider Note   Event Date/Time   First MD Initiated Contact with Patient 04/02/22 1725     (approximate) History  Wound Check (Patient here today for wound check of an ulcer on her RIGHT leg; She has lymphedema and per EMR notes from a virtual visit yesterday, she is unable to care for the wounds herself at home and was requesting to go to a SNF for wound care)  HPI Grace Short is a 77 y.o. female patient with a past medical history of morbid obesity and lymphedema in bilateral lower extremities as well as chronic wounds to bilateral lower extremities who presents after a telemedicine visit yesterday with her primary care physician who was concerned for her mobility at home and recommended SNF placement.  Patient states that she has been unable to dress her wounds or care for herself fully for the last few weeks as her lymphedema and wounds have been worsening.  Patient has been seeing wound care however she cannot do any wound care at home and has been unable to get to wound care visits in the past. ROS: Patient currently denies any vision changes, tinnitus, difficulty speaking, facial droop, sore throat, chest pain, shortness of breath, abdominal pain, nausea/vomiting/diarrhea, dysuria, or weakness/numbness/paresthesias in any extremity   Physical Exam  Triage Vital Signs: ED Triage Vitals [04/02/22 1656]  Enc Vitals Group     BP (!) 129/47     Pulse Rate 66     Resp 18     Temp 97.7 F (36.5 C)     Temp Source Oral     SpO2 95 %     Weight (!) 336 lb 9.6 oz (152.7 kg)     Height 5\' 2"  (1.575 m)     Head Circumference      Peak Flow      Pain Score      Pain Loc      Pain Edu?      Excl. in [place]?    Most recent vital signs: Vitals:   04/02/22 1900 04/02/22 2130  BP: (!) 131/53 (!) 157/66  Pulse: 63 65  Resp: 18 18  Temp:  98 F (36.7 C)  SpO2: 97% 100%   General: Awake, oriented x4. CV:  Good peripheral perfusion.  Resp:  Normal  effort.  Abd:  No distention.  Other:  Morbidly obese elderly Hispanic female laying in bed in no acute distress.  There are ulcerations to the posterior left calf, as well as to the medial aspect of the left knee that did not have any surrounding erythema, or induration ED Results / Procedures / Treatments  Labs (all labs ordered are listed, but only abnormal results are displayed) Labs Reviewed  COMPREHENSIVE METABOLIC PANEL - Abnormal; Notable for the following components:      Result Value   Glucose, Bld 202 (*)    Calcium 8.8 (*)    All other components within normal limits  CBC WITH DIFFERENTIAL/PLATELET - Abnormal; Notable for the following components:   WBC 11.8 (*)    Hemoglobin 10.1 (*)    HCT 34.0 (*)    MCHC 29.7 (*)    Neutro Abs 9.0 (*)    All other components within normal limits  TROPONIN I (HIGH SENSITIVITY) - Abnormal; Notable for the following components:   Troponin I (High Sensitivity) 22 (*)    All other components within normal limits  RESP PANEL BY RT-PCR (RSV, FLU A&B, COVID)  [id number]  LACTIC ACID, PLASMA  URINALYSIS, ROUTINE W REFLEX MICROSCOPIC   PROCEDURES: Critical Care performed: No .1-3 Lead EKG Interpretation  Performed by: Naaman Plummer, MD Authorized by: Naaman Plummer, MD     Interpretation: normal     ECG rate:  71   ECG rate assessment: normal     Rhythm: sinus rhythm     Ectopy: none     Conduction: normal    MEDICATIONS ORDERED IN ED: Medications - No data to display IMPRESSION / MDM / [place] / ED COURSE  I reviewed the triage vital signs and the nursing notes.                             The patient is on the cardiac monitor to evaluate for evidence of arrhythmia and/or significant heart rate changes. Patient's presentation is most consistent with acute presentation with potential threat to life or bodily function. Patient is a 77 year old female with the above-stated past medical history presents for inability to  ambulate as well as inability to care for herself that is been worsening over the last 2-week's.  Patient states that she spoke with her primary care physician who recommended SNF placement and was attempting to get in touch with social work however patient states that she "could not take it at home" and presented to the emergency department.  Patient has no laboratory abnormalities or red flags on physical exam.  Patient does not show any evidence of cellulitis at this time with no erythema or surrounding induration. As patient is not ambulatory as well as is unable to care for herself at home adequately, she will require physical and Occupational Therapy evaluation as well as social work for adequate and safe disposition.  Care of this patient will be signed out to the oncoming physician at the end of my shift.  All pertinent patient information conveyed and all questions answered.  All further care and disposition decisions will be made by the oncoming physician.   FINAL CLINICAL IMPRESSION(S) / ED DIAGNOSES   Final diagnoses:  Ambulatory dysfunction  Chronic ulcer of lower extremity, right, limited to breakdown of skin ([place])  Morbid obesity ([place])   Rx / DC Orders   ED Discharge Orders     None      Note:  This document was prepared using Dragon voice recognition software and may include unintentional dictation errors.   Naaman Plummer, MD 04/03/22 [phone]

## 2022-04-02 NOTE — ED Notes (Signed)
Darnelle Maffucci RN at bedside attempting USIV.

## 2022-04-03 LAB — URINALYSIS, ROUTINE W REFLEX MICROSCOPIC
Bilirubin Urine: NEGATIVE
Glucose, UA: NEGATIVE mg/dL
Hgb urine dipstick: NEGATIVE
Ketones, ur: NEGATIVE mg/dL
Leukocytes,Ua: NEGATIVE
Nitrite: NEGATIVE
Protein, ur: NEGATIVE mg/dL
Specific Gravity, Urine: 1.013 (ref 1.005–1.030)
pH: 5 (ref 5.0–8.0)

## 2022-04-03 LAB — CBG MONITORING, ED
Glucose-Capillary: 135 mg/dL — ABNORMAL HIGH (ref 70–99)
Glucose-Capillary: 107 mg/dL — ABNORMAL HIGH (ref 70–99)
Glucose-Capillary: 237 mg/dL — ABNORMAL HIGH (ref 70–99)
Glucose-Capillary: 171 mg/dL — ABNORMAL HIGH (ref 70–99)
Glucose-Capillary: 159 mg/dL — ABNORMAL HIGH (ref 70–99)

## 2022-04-03 MED ORDER — CARVEDILOL 25 MG PO TABS
25.0000 mg | ORAL_TABLET | Freq: Two times a day (BID) | ORAL | Status: DC
  Administered 2022-04-03 – 2022-04-12 (×19): 25 mg via ORAL
  Filled 2022-04-03 (×19): qty 1

## 2022-04-03 MED ORDER — ENOXAPARIN SODIUM 80 MG/0.8ML IJ SOSY
70.0000 mg | PREFILLED_SYRINGE | INTRAMUSCULAR | Status: DC
  Filled 2022-04-03: qty 0.7

## 2022-04-03 MED ORDER — CHOLESTYRAMINE 4 G PO PACK: 4.0000 g | PACK | Freq: Two times a day (BID) | ORAL | Status: DC

## 2022-04-03 MED ORDER — ENOXAPARIN SODIUM 80 MG/0.8ML IJ SOSY
70.0000 mg | PREFILLED_SYRINGE | INTRAMUSCULAR | Status: DC
  Administered 2022-04-03 – 2022-04-12 (×10): 70 mg via SUBCUTANEOUS
  Filled 2022-04-03 (×10): qty 0.7

## 2022-04-03 MED ORDER — LEVETIRACETAM 750 MG PO TABS
750.0000 mg | ORAL_TABLET | Freq: Two times a day (BID) | ORAL | Status: DC
  Administered 2022-04-03 – 2022-04-12 (×19): 750 mg via ORAL
  Filled 2022-04-03 (×19): qty 1

## 2022-04-03 MED ORDER — DORZOLAMIDE HCL-TIMOLOL MAL 2-0.5 % OP SOLN
1.0000 [drp] | Freq: Two times a day (BID) | OPHTHALMIC | Status: DC
  Administered 2022-04-03 – 2022-04-11 (×17): 1 [drp] via OPHTHALMIC
  Filled 2022-04-03: qty 10

## 2022-04-03 MED ORDER — VITAMIN C 500 MG PO TABS
250.0000 mg | ORAL_TABLET | ORAL | Status: DC
  Administered 2022-04-03 – 2022-04-11 (×5): 250 mg via ORAL
  Filled 2022-04-03 (×6): qty 1

## 2022-04-03 MED ORDER — LINAGLIPTIN 5 MG PO TABS
5.0000 mg | ORAL_TABLET | Freq: Every day | ORAL | Status: DC
  Administered 2022-04-03 – 2022-04-12 (×10): 5 mg via ORAL
  Filled 2022-04-03 (×10): qty 1

## 2022-04-03 MED ORDER — VITAMIN D 25 MCG (1000 UNIT) PO TABS
1000.0000 [IU] | ORAL_TABLET | ORAL | Status: DC
  Administered 2022-04-03 – 2022-04-11 (×5): 1000 [IU] via ORAL
  Filled 2022-04-03 (×6): qty 1

## 2022-04-03 MED ORDER — ACETAMINOPHEN 325 MG PO TABS: 650.0000 mg | ORAL_TABLET | Freq: Two times a day (BID) | ORAL | Status: DC | PRN

## 2022-04-03 MED ORDER — FUROSEMIDE 40 MG PO TABS
40.0000 mg | ORAL_TABLET | Freq: Every day | ORAL | Status: DC
  Administered 2022-04-03 – 2022-04-12 (×10): 40 mg via ORAL
  Filled 2022-04-03 (×10): qty 1

## 2022-04-03 MED ORDER — ATORVASTATIN CALCIUM 20 MG PO TABS
20.0000 mg | ORAL_TABLET | Freq: Every day | ORAL | Status: DC
  Administered 2022-04-03 – 2022-04-12 (×10): 20 mg via ORAL
  Filled 2022-04-03 (×10): qty 1

## 2022-04-03 MED ORDER — SPIRONOLACTONE 12.5 MG HALF TABLET
12.5000 mg | ORAL_TABLET | Freq: Every day | ORAL | Status: DC
  Administered 2022-04-03 – 2022-04-12 (×10): 12.5 mg via ORAL
  Filled 2022-04-03 (×10): qty 1

## 2022-04-03 MED ORDER — LORATADINE 10 MG PO TABS
10.0000 mg | ORAL_TABLET | Freq: Every day | ORAL | Status: DC | PRN
  Filled 2022-04-03: qty 1

## 2022-04-03 MED ORDER — INSULIN GLARGINE-YFGN 100 UNIT/ML ~~LOC~~ SOLN
20.0000 [IU] | Freq: Two times a day (BID) | SUBCUTANEOUS | Status: DC
  Administered 2022-04-03 – 2022-04-12 (×17): 20 [IU] via SUBCUTANEOUS
  Filled 2022-04-03 (×20): qty 0.2

## 2022-04-03 MED ORDER — SACUBITRIL-VALSARTAN 97-103 MG PO TABS
1.0000 | ORAL_TABLET | Freq: Two times a day (BID) | ORAL | Status: DC
  Administered 2022-04-03 – 2022-04-12 (×19): 1 via ORAL
  Filled 2022-04-03 (×19): qty 1

## 2022-04-03 MED ORDER — ENOXAPARIN SODIUM 40 MG/0.4ML IJ SOSY: 40.0000 mg | PREFILLED_SYRINGE | INTRAMUSCULAR | Status: DC

## 2022-04-03 MED ORDER — INSULIN ASPART 100 UNIT/ML IJ SOLN
0.0000 [IU] | Freq: Three times a day (TID) | INTRAMUSCULAR | Status: DC
  Administered 2022-04-03: 4 [IU] via SUBCUTANEOUS
  Administered 2022-04-03: 3 [IU] via SUBCUTANEOUS
  Administered 2022-04-03: 7 [IU] via SUBCUTANEOUS
  Administered 2022-04-04: 3 [IU] via SUBCUTANEOUS
  Administered 2022-04-04: 4 [IU] via SUBCUTANEOUS
  Administered 2022-04-04 – 2022-04-07 (×6): 3 [IU] via SUBCUTANEOUS
  Administered 2022-04-08: 4 [IU] via SUBCUTANEOUS
  Administered 2022-04-08: 3 [IU] via SUBCUTANEOUS
  Administered 2022-04-08: 4 [IU] via SUBCUTANEOUS
  Administered 2022-04-09: 7 [IU] via SUBCUTANEOUS
  Administered 2022-04-09: 3 [IU] via SUBCUTANEOUS
  Administered 2022-04-10: 4 [IU] via SUBCUTANEOUS
  Administered 2022-04-10 – 2022-04-12 (×4): 3 [IU] via SUBCUTANEOUS
  Filled 2022-04-03 (×22): qty 1

## 2022-04-03 MED ORDER — VITAMIN B-12 100 MCG PO TABS
100.0000 ug | ORAL_TABLET | ORAL | Status: DC
  Administered 2022-04-03 – 2022-04-11 (×5): 100 ug via ORAL
  Filled 2022-04-03 (×5): qty 1

## 2022-04-03 MED ORDER — NYSTATIN 100000 UNIT/GM EX POWD: 1.0000 | Freq: Every day | CUTANEOUS | Status: DC | PRN

## 2022-04-03 NOTE — ED Notes (Addendum)
Patient had diarrhea using the bedpan. Patient cleaned and repositioned. Patient denies needs at this time. Patient care handed off to [place], [place]

## 2022-04-03 NOTE — TOC Initial Note (Signed)
Transition of Care (TOC) - Initial/Assessment Note    Patient Details  Name: Grace Short. Desilva MRN: [id number] Date of Birth: [birth year]  Transition of Care [facility]) CM/SW Contact:    Raina Mina, [place] Phone Number: 04/03/2022, 9:00 AM  Clinical Narrative: CSW spoke with patients roommate who also states she is patients POA. Ms. Cammy Copa stated that patient needs SNF placement and she cannot lift her at home because her wound opened back up. Ms. Gilford Rile stated she only needs short term and then wants patient to return home after. Ms. Gilford Rile stated she does not [facility] to [facility] or [facility]. Ms. Gilford Rile prefers [facility]. CSW will expand the SNF search as there is no guarantee with [facility]. Ms. Gilford Rile stated she is going to look at [facility] in [place] too.                Expected Discharge Plan: Skilled Nursing Facility Barriers to Discharge: Continued Medical Work up   Patient Goals and CMS Choice Patient states their goals for this hospitalization and ongoing recovery are:: Return home after SNF          Expected Discharge Plan and Services       Living arrangements for the past 2 months: Single Family Home                                      Prior Living Arrangements/Services Living arrangements for the past 2 months: Single Family Home Lives with:: Friends Patient language and need for interpreter reviewed:: Yes Do you feel safe going back to the place where you live?: Yes      Need for Family Participation in Patient Care: Yes (Comment) Care giver support system in place?: Yes (comment)   Criminal Activity/Legal Involvement Pertinent to Current Situation/Hospitalization: No - Comment as needed  Activities of Daily Living      Permission Sought/Granted Permission sought to share information with : Family Supports Permission granted to share information with : Yes, Verbal Permission Granted     Permission granted to  share info w AGENCY: [employer]  Permission granted to share info w Relationship: Roommate/POA  Permission granted to share info w Contact Information: [phone]  Emotional Assessment Appearance:: Appears stated age     Orientation: : Oriented to Self, Oriented to Place, Oriented to  Time, Oriented to Situation Alcohol / Substance Use: Not Applicable Psych Involvement: No (comment)  Admission diagnosis:  leg pain Patient Active Problem List   Diagnosis Date Noted   Hypertensive urgency    Cellulitis of both lower extremities 09/07/2020   Hematuria    Lymphedema    Type 2 diabetes mellitus with hyperlipidemia ([place])    Morbid obesity with BMI of 60.0-69.9, adult ([place])    Weakness    Chronic diastolic CHF (congestive heart failure) ([place]) 12/07/2019   Edema of both lower extremities due to peripheral venous insufficiency 12/07/2019   Stasis edema with ulcer and inflammation, bilateral ([place]) 12/07/2019   SSS (sick sinus syndrome) ([place]) 12/07/2019   Pneumonia due to COVID-19 virus 12/07/2019   HTN (hypertension) 12/07/2019   COPD (chronic obstructive pulmonary disease) ([place]) 12/07/2019   Acute respiratory failure with hypoxemia ([place]) 12/07/2019   Glaucoma 12/20/2017   Pacemaker 12/20/2017   Personal history of transient ischemic attack (TIA), and cerebral infarction without residual deficits 12/20/2017   Seizure ([place]) 12/20/2017   Sleep  apnea, unspecified 12/20/2017   Essential hypertension 12/20/2017   PCP:  Fanny Dance, MD Pharmacy:   [facility], [place] [address] Phone: [phone] Fax: [phone]     Social Determinants of Health (SDOH) Social History: SDOH Screenings   Tobacco Use: Low Risk  (03/28/2021)   SDOH Interventions:     Readmission Risk Interventions     No data to display

## 2022-04-03 NOTE — ED Notes (Signed)
Pt taken off bed pan and provided pericare and clean dry purewick by this RN and NT [employer].

## 2022-04-03 NOTE — ED Notes (Signed)
Gave report to Heather RN

## 2022-04-03 NOTE — Progress Notes (Signed)
PHARMACIST - PHYSICIAN COMMUNICATION  CONCERNING:  Enoxaparin (Lovenox) for DVT Prophylaxis    RECOMMENDATION: Patient was prescribed enoxaprin 40mg  q24 hours for VTE prophylaxis.   Filed Weights   04/02/22 1656 04/02/22 2130  Weight: (!) 152.7 kg (336 lb 9.6 oz) (!) 147 kg (324 lb 1.2 oz)    Body mass index is 59.27 kg/m.  Estimated Creatinine Clearance: 80.9 mL/min (by C-G formula based on SCr of 0.83 mg/dL).   Based on [place] patient is candidate for enoxaparin 0.5mg /kg TBW SQ every 24 hours based on BMI being >30.   DESCRIPTION: Pharmacy has adjusted enoxaparin dose per [facility] policy.  Patient is now receiving enoxaparin 70 mg every 24 hours    Pernell Dupre, PharmD, BCPS Clinical Pharmacist 04/03/2022 11:54 AM

## 2022-04-03 NOTE — ED Notes (Signed)
Meal tray at bedside.  

## 2022-04-03 NOTE — ED Notes (Signed)
Messaged pharmacy request pt eyedrops due to be sent.

## 2022-04-03 NOTE — Evaluation (Addendum)
0Physical Therapy Evaluation Patient Details Name: Grace Short MRN: [id number] DOB: Jan 31, 1946 Today's Date: 04/03/2022  History of Present Illness  Pt is a 77 y/o who presented on 04/02/22 for RLE ulcer wound check & after telemedicine PCP visit the day prior with PCP concerned about pt's mobility & recommending SNF placement. Pt has been unable to dress her wounds or care for herself fully for the last few weeks 2/2 lymphedema & worsening wounds. PMH: morbid obesity, lymphedema in BLE, chronic wounds in BLE  Clinical Impression  Pt seen for PT evaluation with pt agreeable to tx, reporting need to restroom. Pt willing to complete bed mobility but required max assist with use of hospital bed features to come to sitting EOB, pt tolerates sitting with supervision x ~2 minutes. Pt requires +2 for sit>supine & to scoot to [facility]. Provided pt with bed pan. Of note, pt with open wounds on BLE (R>L) -- nurse notified. Pt would benefit from STR upon d/c to maximize independence with functional mobility & reduce fall risk prior to return home.   Recommendations for follow up therapy are one component of a multi-disciplinary discharge planning process, led by the attending physician.  Recommendations may be updated based on patient status, additional functional criteria and insurance authorization.  Follow Up Recommendations Skilled nursing-short term rehab (<3 hours/day) Can patient physically be transported by private vehicle: No    Assistance Recommended at Discharge Frequent or constant Supervision/Assistance  Patient can return home with the following  Two people to help with walking and/or transfers;Two people to help with bathing/dressing/bathroom;Help with stairs or ramp for entrance;Assist for transportation;Direct supervision/assist for financial management;Assistance with cooking/housework    Equipment Recommendations None recommended by PT  Recommendations for Other Services       Functional  Status Assessment Patient has had a recent decline in their functional status and demonstrates the ability to make significant improvements in function in a reasonable and predictable amount of time.     Precautions / Restrictions Precautions Precautions: Fall Restrictions Weight Bearing Restrictions: No      Mobility  Bed Mobility Overal bed mobility: Needs Assistance Bed Mobility: Supine to Sit, Sit to Supine, Rolling Rolling: Max assist, +2 for physical assistance   Supine to sit: Max assist, HOB elevated Sit to supine: Max assist, +2 for physical assistance, HOB elevated   General bed mobility comments: Pt is able to complete supine>sit with max assist with HOB elevated, use of bed rails, PT assisting BLE towards EOB & to scoot to EOB. Pt requires +2 for sit>supine & +2 total assist to scoot to [facility] with bed in [place] position.    Transfers                        Ambulation/Gait                  Stairs            Wheelchair Mobility    Modified Rankin (Stroke Patients Only)       Balance Overall balance assessment: Needs assistance Sitting-balance support: Feet supported, Bilateral upper extremity supported Sitting balance-Leahy Scale: Fair Sitting balance - Comments: supervision static sitting                                     Pertinent Vitals/Pain Pain Assessment Pain Assessment: Faces Faces Pain Scale: Hurts whole lot Pain Location:  BLE Pain Descriptors / Indicators: Discomfort Pain Intervention(s): Monitored during session, Repositioned    Home Living Family/patient expects to be discharged to:: Skilled nursing facility                   Additional Comments: Pt was living in 1 level home with 1 step to enter. Pt resides with a roommate.    Prior Function               Mobility Comments: Pt reports she has been able to ambulate with a RW but in the past week or so has been unable to get OOB.  Pt's roommate assists as able.       Hand Dominance        Extremity/Trunk Assessment   Upper Extremity Assessment Upper Extremity Assessment: Generalized weakness    Lower Extremity Assessment Lower Extremity Assessment: Generalized weakness (BLE lymphadema, BLE wounds)       Communication   Communication: No difficulties  Cognition Arousal/Alertness: Awake/alert Behavior During Therapy: WFL for tasks assessed/performed Overall Cognitive Status: Within Functional Limits for tasks assessed                                          General Comments      Exercises     Assessment/Plan    PT Assessment Patient needs continued PT services  PT Problem List Decreased strength;Cardiopulmonary status limiting activity;Pain;Decreased range of motion;Decreased activity tolerance;Decreased balance;Decreased knowledge of use of DME;Decreased safety awareness;Decreased mobility;Decreased skin integrity       PT Treatment Interventions Therapeutic exercise;Gait training;Balance training;DME instruction;Wheelchair mobility training;Cognitive remediation;Therapeutic activities;Patient/family education;Neuromuscular re-education;Stair training;Functional mobility training    PT Goals (Current goals can be found in the Care Plan section)  Acute Rehab PT Goals Patient Stated Goal: go to rehab for BLE wounds PT Goal Formulation: With patient Time For Goal Achievement: 04/18/22 Potential to Achieve Goals: Fair    Frequency Min 2X/week     Co-evaluation               AM-PAC PT "6 Clicks" Mobility  Outcome Measure Help needed turning from your back to your side while in a flat bed without using bedrails?: Total Help needed moving from lying on your back to sitting on the side of a flat bed without using bedrails?: Total Help needed moving to and from a bed to a chair (including a wheelchair)?: Total Help needed standing up from a chair using your arms (e.g.,  wheelchair or bedside chair)?: Total Help needed to walk in hospital room?: Total Help needed climbing 3-5 steps with a railing? : Total 6 Click Score: 6    End of Session   Activity Tolerance: Patient tolerated treatment well Patient left: in bed;with call bell/phone within reach   PT Visit Diagnosis: Muscle weakness (generalized) (M62.81);Difficulty in walking, not elsewhere classified (R26.2)    Time: 4098-1191 PT Time Calculation (min) (ACUTE ONLY): 14 min   Charges:   PT Evaluation $PT Eval High Complexity: 1 High          Lavone Nian, PT, DPT 04/03/22, 10:34 AM   Waunita Schooner 04/03/2022, 10:17 AM

## 2022-04-03 NOTE — ED Notes (Signed)
Notified MD that RN completed medication reconciliation.

## 2022-04-03 NOTE — NC FL2 (Signed)
[place] LEVEL OF CARE FORM     IDENTIFICATION  Patient Name: Grace Short. Spera Birthdate: [birth year] Sex: female Admission Date (Current Location): 04/02/2022  [place] and [place] Number:  Engineering geologist and Address:  [facility], [address], [place], [place] [address]      Provider Number: [id number]  Attending Physician Name and Address:  No att. providers found  Relative Name and Phone Number:  Cammy Copa [facility])  [phone], [place]    Current Level of Care: Hospital Recommended Level of Care: [place] Prior Approval Number:    Date Approved/Denied:   PASRR Number: [id number]  Discharge Plan: SNF    Current Diagnoses: Patient Active Problem List   Diagnosis Date Noted   Hypertensive urgency    Cellulitis of both lower extremities 09/07/2020   Hematuria    Lymphedema    Type 2 diabetes mellitus with hyperlipidemia ([place])    Morbid obesity with BMI of 60.0-69.9, adult ([place])    Weakness    Chronic diastolic CHF (congestive heart failure) ([place]) 12/07/2019   Edema of both lower extremities due to peripheral venous insufficiency 12/07/2019   Stasis edema with ulcer and inflammation, bilateral ([place]) 12/07/2019   SSS (sick sinus syndrome) ([place]) 12/07/2019   Pneumonia due to COVID-19 virus 12/07/2019   HTN (hypertension) 12/07/2019   COPD (chronic obstructive pulmonary disease) ([place]) 12/07/2019   Acute respiratory failure with hypoxemia ([place]) 12/07/2019   Glaucoma 12/20/2017   Pacemaker 12/20/2017   Personal history of transient ischemic attack (TIA), and cerebral infarction without residual deficits 12/20/2017   Seizure ([place]) 12/20/2017   Sleep apnea, unspecified 12/20/2017   Essential hypertension 12/20/2017    Orientation RESPIRATION BLADDER Height & Weight     Self, Time, Situation, Place  Normal Continent Weight: (!) 324 lb 1.2 oz (147 kg) Height:  5\' 2"  (157.5 cm)  BEHAVIORAL  SYMPTOMS/MOOD NEUROLOGICAL BOWEL NUTRITION STATUS      Continent Diet (Regular)  AMBULATORY STATUS COMMUNICATION OF NEEDS Skin   Extensive Assist Verbally Other (Comment) (Open right leg ulcer wound. Patient also has Lymphedema)                       Personal Care Assistance Level of Assistance  Bathing, Feeding, Dressing Bathing Assistance: Maximum assistance Feeding assistance: Independent Dressing Assistance: Limited assistance     Functional Limitations Info  Sight, Hearing, Speech Sight Info: Adequate Hearing Info: Adequate Speech Info: Adequate    SPECIAL CARE FACTORS FREQUENCY                       Contractures Contractures Info: Not present    Additional Factors Info  Code Status, Allergies Code Status Info: Full Allergies Info: Ativan (lorazepam)           Current Medications (04/03/2022):  This is the current hospital active medication list Current Facility-Administered Medications  Medication Dose Route Frequency Provider Last Rate Last Admin   acetaminophen (TYLENOL) tablet 650 mg  650 mg Oral BID PRN Hinda Kehr, MD       ascorbic acid (VITAMIN C) tablet 250 mg  250 mg Oral Titus Dubin, MD   250 mg at 04/03/22 8469   atorvastatin (LIPITOR) tablet 20 mg  20 mg Oral Daily Hinda Kehr, MD   20 mg at 04/03/22 0937   carvedilol (COREG) tablet 25 mg  25 mg Oral BID WC Hinda Kehr, MD   25 mg at 04/03/22  0756   cholecalciferol (VITAMIN D3) 25 MCG (1000 UNIT) tablet 1,000 Units  1,000 Units Oral Melinda Crutch, MD   1,000 Units at 04/03/22 0941   dorzolamide-timolol (COSOPT) 2-0.5 % ophthalmic solution 1 drop  1 drop Both Eyes BID Loleta Rose, MD       furosemide (LASIX) tablet 40 mg  40 mg Oral Daily Loleta Rose, MD   40 mg at 04/03/22 4782   insulin aspart (novoLOG) injection 0-20 Units  0-20 Units Subcutaneous TID [facility] Loleta Rose, MD   3 Units at 04/03/22 0756   insulin glargine-yfgn (SEMGLEE) injection 20 Units  20 Units  Subcutaneous BID Loleta Rose, MD       levETIRAcetam (KEPPRA) tablet 750 mg  750 mg Oral BID Loleta Rose, MD   750 mg at 04/03/22 9562   linagliptin (TRADJENTA) tablet 5 mg  5 mg Oral Daily Loleta Rose, MD   5 mg at 04/03/22 1308   loratadine (CLARITIN) tablet 10 mg  10 mg Oral Daily PRN Loleta Rose, MD       nystatin (MYCOSTATIN/NYSTOP) topical powder 1 Application  1 Application Topical Daily PRN Loleta Rose, MD       sacubitril-valsartan (ENTRESTO) 97-103 mg per tablet  1 tablet Oral BID Loleta Rose, MD   1 tablet at 04/03/22 6578   spironolactone (ALDACTONE) tablet 12.5 mg  12.5 mg Oral Daily Loleta Rose, MD   12.5 mg at 04/03/22 4696   vitamin B-12 (CYANOCOBALAMIN) tablet 100 mcg  100 mcg Oral Melinda Crutch, MD   100 mcg at 04/03/22 2952   Current Outpatient Medications  Medication Sig Dispense Refill   acetaminophen (TYLENOL) 325 MG tablet Take 650 mg by mouth 2 (two) times daily as needed.     atorvastatin (LIPITOR) 20 MG tablet Take 20 mg by mouth daily.     carvedilol (COREG) 12.5 MG tablet Take 1 tablet (12.5 mg total) by mouth 2 (two) times daily with a meal. (Patient taking differently: Take 25 mg by mouth 2 (two) times daily with a meal.) 60 tablet 0   cholecalciferol (VITAMIN D3) 25 MCG (1000 UNIT) tablet Take 1,000 Units by mouth every other day.     dorzolamide-timolol (COSOPT) 22.3-6.8 MG/ML ophthalmic solution Place 1 drop into both eyes 2 (two) times daily.     furosemide (LASIX) 40 MG tablet Take 1 tablet (40 mg total) by mouth daily. 30 tablet    insulin glargine (LANTUS) 100 UNIT/ML injection Inject 0.13 mLs (13 Units total) into the skin 2 (two) times daily. (Patient taking differently: Inject 20 Units into the skin 2 (two) times daily.) 10 mL 11   insulin lispro (HUMALOG) 100 UNIT/ML injection Inject 0.06 mLs (6 Units total) into the skin 3 (three) times daily with meals. Inject 10u under the skin three times daily at mealtimes (plus applicable sliding  scale) - do not use if blood glucose <150 (Patient taking differently: Inject 6 Units into the skin 3 (three) times daily with meals. *SLIDING SCALE* Inject 10u under the skin three times daily at mealtimes (plus applicable sliding scale) - do not use if blood glucose <150) 10 mL 11   levETIRAcetam (KEPPRA) 750 MG tablet Take 750 mg by mouth 2 (two) times daily.     linagliptin (TRADJENTA) 5 MG TABS tablet Take 5 mg by mouth daily.     nystatin (MYCOSTATIN/NYSTOP) powder Apply 1 application topically daily as needed.     sacubitril-valsartan (ENTRESTO) 97-103 MG Take 1 tablet by mouth 2 (  two) times daily. 60 tablet 0   spironolactone (ALDACTONE) 25 MG tablet Take 0.5 tablets (12.5 mg total) by mouth daily. 30 tablet 0   vitamin B-12 (CYANOCOBALAMIN) 100 MCG tablet Take 100 mcg by mouth every other day.     vitamin C (ASCORBIC ACID) 250 MG tablet Take 250 mg by mouth every other day.     albuterol (VENTOLIN HFA) 108 (90 Base) MCG/ACT inhaler Inhale 2 puffs into the lungs once as needed for wheezing or shortness of breath (for Grade 3 or 4 hypersensitivity reaction).     cholestyramine (QUESTRAN) 4 g packet Take 1 packet (4 g total) by mouth 2 (two) times daily for 7 days. 14 each 0   hydrocerin (EUCERIN) CREA Apply 1 application topically daily. 454 g 0   loratadine (CLARITIN) 10 MG tablet Take 10 mg by mouth daily as needed for allergies.     metoprolol succinate (TOPROL-XL) 25 MG 24 hr tablet Take by mouth.       Discharge Medications: Please see discharge summary for a list of discharge medications.  Relevant Imaging Results:  Relevant Lab Results:   Additional Information SS# [id number]  Raina Mina, LCSWA

## 2022-04-03 NOTE — ED Notes (Signed)
This RN to bedside, pt had BM and was cleaned up.  New gown and linens provided.

## 2022-04-03 NOTE — ED Notes (Signed)
Pt assisted with repositioning of legs in bed at this time.

## 2022-04-04 LAB — CBG MONITORING, ED
Glucose-Capillary: 131 mg/dL — ABNORMAL HIGH (ref 70–99)
Glucose-Capillary: 167 mg/dL — ABNORMAL HIGH (ref 70–99)
Glucose-Capillary: 141 mg/dL — ABNORMAL HIGH (ref 70–99)

## 2022-04-04 NOTE — ED Notes (Signed)
Pt adjusted in bed per her request.

## 2022-04-04 NOTE — TOC Progression Note (Signed)
Transition of Care (TOC) - Progression Note    Patient Details  Name: Grace Short MRN: [id number] Date of Birth: May 19, 1945  Transition of Care [facility] CM/SW Contact  Shelbie Hutching, RN Phone Number: 04/04/2022, 4:10 PM  Clinical Narrative:    Met with patient in the emergency room this morning to review bed offers.  Patient would like for RNCM to call her roommate, Grace Short.  Called Grace Short and gave bed offers for [facility] and [facility].  She originally chose [facility].  [facility] turned out did not have a bariatric bed available.  [facility] and [facility] have offered.  Grace Short would like [facility] for patient.  Called and left a message for Grace Short at [place] to start insurance authorization as patient is not [place].      Expected Discharge Plan: [place] Barriers to Discharge: Continued Medical Work up  Expected Discharge Plan and Services       Living arrangements for the past 2 months: Single Family Home                                       Social Determinants of Health (SDOH) Interventions SDOH Screenings   Tobacco Use: Low Risk  (03/28/2021)    Readmission Risk Interventions     No data to display

## 2022-04-04 NOTE — Evaluation (Signed)
Occupational Therapy Evaluation Patient Details Name: Grace Short MRN: [id number] DOB: [birth year] Today's Date: 04/04/2022   History of Present Illness Pt is a 77 y/o who presented on 04/02/22 for RLE ulcer wound check & after telemedicine PCP visit the day prior with PCP concerned about pt's mobility & recommending SNF placement. Pt has been unable to dress her wounds or care for herself fully for the last few weeks 2/2 lymphedema & worsening wounds. PMH: morbid obesity, lymphedema in BLE, chronic wounds in BLE   Clinical Impression   Grace Short presents with a marked decrease in fxl mobility, reporting that for the past week she has been unable to get OOB. During today's evaluation, she required Max A for rolling in bed and for supine<>sit transfers. She is unable to come up into standing. Pt is able, with effort, to flex her L knee ~ 20*. Unable to flex R knee. Pt states pain in R LE is worse than in L, although both LE ache. At baseline, pt sleeps in recliner, uses RW for limited in-home ambulation, sponge bathes. Pt's roommate assists pt with dressing; however, roommate is now ill herself and unable to assist pt with care. Recommend DC to SNF to improve fxl mobility and return to PLOF.    Recommendations for follow up therapy are one component of a multi-disciplinary discharge planning process, led by the attending physician.  Recommendations may be updated based on patient status, additional functional criteria and insurance authorization.   Follow Up Recommendations  Skilled nursing-short term rehab (<3 hours/day)     Assistance Recommended at Discharge Frequent or constant Supervision/Assistance  Patient can return home with the following Two people to help with walking and/or transfers;Two people to help with bathing/dressing/bathroom;Assistance with cooking/housework;Help with stairs or ramp for entrance;Assist for transportation    Functional Status Assessment  Patient has had a  recent decline in their functional status and demonstrates the ability to make significant improvements in function in a reasonable and predictable amount of time.  Equipment Recommendations  Other (comment) (defer to next venue)    Recommendations for Other Services       Precautions / Restrictions Precautions Precautions: Fall Restrictions Weight Bearing Restrictions: No      Mobility Bed Mobility Overal bed mobility: Needs Assistance Bed Mobility: Rolling, Supine to Sit, Sit to Supine Rolling: Max assist, +2 for physical assistance   Supine to sit: Max assist, HOB elevated, +2 for physical assistance Sit to supine: Max assist, +2 for physical assistance, HOB elevated   General bed mobility comments: Max A for positioning BLE. Pt able to roll to R w/ Max A; difficulty rolling to L, even w/ Max A.    Transfers                   General transfer comment: pt unable      Balance Overall balance assessment: Needs assistance Sitting-balance support: Feet supported, Bilateral upper extremity supported Sitting balance-Leahy Scale: Fair       Standing balance-Leahy Scale: Zero                             ADL either performed or assessed with clinical judgement   ADL Overall ADL's : Needs assistance/impaired                     Lower Body Dressing: Maximal assistance  Vision         Perception     Praxis      Pertinent Vitals/Pain Pain Assessment Pain Score: 0-No pain (states she sometimes has severe pain in legs, especially with movement. Not having any at present, however.)     Hand Dominance Right   Extremity/Trunk Assessment Upper Extremity Assessment Upper Extremity Assessment: Generalized weakness   Lower Extremity Assessment Lower Extremity Assessment: Generalized weakness       Communication Communication Communication: No difficulties   Cognition Arousal/Alertness:  Awake/alert Behavior During Therapy: WFL for tasks assessed/performed Overall Cognitive Status: Within Functional Limits for tasks assessed                                       General Comments       Exercises Other Exercises Other Exercises: Educ re: PoC, DC recs, home safety   Shoulder Instructions      Home Living Family/patient expects to be discharged to:: Private residence Living Arrangements: Other relatives Available Help at Discharge: Friend(s) Type of Home: House Home Access: Stairs to enter Technical brewer of Steps: 1   Home Layout: One level     Bathroom Shower/Tub: Sponge bathes at baseline   Bathroom Toilet: Handicapped height     Home Equipment: Conservation officer, nature (2 wheels)   Additional Comments: Pt was living in 1 level home with 1 step to enter. Pt resides with a roommate.      Prior Functioning/Environment Prior Level of Function : Needs assist             Mobility Comments: Pt reports she has been able to ambulate with a RW but in the past week or so has been unable to get OOB. Pt's roommate assists as able. ADLs Comments: Roommate has been assisting pt with most ADLs; roommate herself is now sick, however, unable to assist much, and pt has had a good bit of difficulty managing ADLs lately. She sponge bathes at baseline. Sleeps in a lift recliner        OT Problem List: Decreased strength;Decreased activity tolerance;Increased edema;Decreased range of motion;Obesity;Pain      OT Treatment/Interventions: Therapeutic exercise;Self-care/ADL training;Therapeutic activities;Patient/family education;Balance training    OT Goals(Current goals can be found in the care plan section) Acute Rehab OT Goals Patient Stated Goal: to be able to get up on own, move around OT Goal Formulation: With patient Time For Goal Achievement: 04/18/22 Potential to Achieve Goals: Good ADL Goals Pt Will Perform Lower Body Dressing: with min  assist;sitting/lateral leans Pt Will Transfer to Toilet: stand pivot transfer;with min assist Pt/caregiver will Perform Home Exercise Program: Independently  OT Frequency: Min 2X/week    Co-evaluation              AM-PAC OT "6 Clicks" Daily Activity     Outcome Measure Help from another person eating meals?: None Help from another person taking care of personal grooming?: A Little Help from another person toileting, which includes using toliet, bedpan, or urinal?: A Lot Help from another person bathing (including washing, rinsing, drying)?: A Lot Help from another person to put on and taking off regular upper body clothing?: A Lot Help from another person to put on and taking off regular lower body clothing?: A Lot 6 Click Score: 15   End of Session    Activity Tolerance: Patient tolerated treatment well Patient left: in bed;with call bell/phone within  reach;with bed alarm set  OT Visit Diagnosis: Unsteadiness on feet (R26.81);Other abnormalities of gait and mobility (R26.89);Muscle weakness (generalized) (M62.81);Pain                Time: 0932-6712 OT Time Calculation (min): 19 min Charges:  OT General Charges $OT Visit: 1 Visit OT Evaluation $OT Eval Low Complexity: 1 Low OT Treatments $Self Care/Home Management : 8-22 mins Latina Craver, PhD, MS, OTR/L 04/04/22, 10:45 AM

## 2022-04-04 NOTE — Consult Note (Signed)
[facility] Nurse Consult Note: Reason for Consult: Patient with lymphedema and RLE with full thickness wounds. Unable to perform self care at home. Requesting placement to SNF/SNF Rehab. Patient sees outpatient wound care at [facility] facility, which is affiliated with [facility]. She has no lymphedema pumps at this time. Wound type: Lymphedema, venopus insufficiency Pressure Injury POA: N/A Measurement: RLE with medial aspect full thickness wound measuring 4cm x 7cm x 0.2cm with 100% pink, moist wound bed. I am unable to visualize other wounds. Small amount of serous exudate on paper underpad beneath leg. Do drainage from other LE. Wound bed:As described above Drainage (amount, consistency, odor) small to moderate amount serous exudate from RLE wound Periwound: with skin changes consistent with lymphedema Dressing procedure/placement/frequency: I have provided Nursing with guidance for every other day topical care using a soap and water cleanse, rinse and dry followed by moisturizing (Left LE) and topical care to the RLE with a silver hydrofiber to wounds topped with with an ABD pad.  Both LEs are to be wrapped with "dry boots", I.e., wrapped from just below toes to just below knee with a Kerlix roll gauze secured with paper tape. The Kerlix is to be topped with a 6-inch ACE bandage applied in a similar manner.  Patient is to return to the outpatient wound care center as directed for continuing care and oversight of this chronic condition.   A sacral silicone foam is to be placed for PI prevention and patient turned and repositioned per house protocol while she is awaiting placement.   [facility] nursing team will not follow, but will remain available to this patient, the nursing and medical teams.  Please re-consult if needed.  Thank you for inviting [place] to participate in this patient's Plan of Care.  Maudie Flakes, MSN, RN, CNS, [place], Serita Grammes, [employer], [employer] phone:   [phone]

## 2022-04-04 NOTE — NC FL2 (Signed)
[place] LEVEL OF CARE FORM     IDENTIFICATION  Patient Name: Grace Short Birthdate: 1946/01/29 Sex: female Admission Date (Current Location): 04/02/2022  [place] and [place] Number:  Engineering geologist and Address:  [facility], [address] [place], [place] [address]      Provider Number: [id number]  Attending Physician Name and Address:  No att. providers found  Relative Name and Phone Number:  Cammy Copa [facility])  [phone], [place]    Current Level of Care: Hospital Recommended Level of Care: [place] Prior Approval Number:    Date Approved/Denied:   PASRR Number: [id number]  Discharge Plan: SNF    Current Diagnoses: Patient Active Problem List   Diagnosis Date Noted   Hypertensive urgency    Cellulitis of both lower extremities 09/07/2020   Hematuria    Lymphedema    Type 2 diabetes mellitus with hyperlipidemia ([place])    Morbid obesity with BMI of 60.0-69.9, adult ([place])    Weakness    Chronic diastolic CHF (congestive heart failure) ([place]) 12/07/2019   Edema of both lower extremities due to peripheral venous insufficiency 12/07/2019   Stasis edema with ulcer and inflammation, bilateral (HCC) 12/07/2019   SSS (sick sinus syndrome) ([place]) 12/07/2019   Pneumonia due to COVID-19 virus 12/07/2019   HTN (hypertension) 12/07/2019   COPD (chronic obstructive pulmonary disease) ([place]) 12/07/2019   Acute respiratory failure with hypoxemia ([place]) 12/07/2019   Glaucoma 12/20/2017   Pacemaker 12/20/2017   Personal history of transient ischemic attack (TIA), and cerebral infarction without residual deficits 12/20/2017   Seizure ([place]) 12/20/2017   Sleep apnea, unspecified 12/20/2017   Essential hypertension 12/20/2017    Orientation RESPIRATION BLADDER Height & Weight     Self, Time, Situation, Place  Normal Continent Weight: (!) 147 kg Height:  5\' 2"  (157.5 cm)  BEHAVIORAL SYMPTOMS/MOOD NEUROLOGICAL  BOWEL NUTRITION STATUS      Continent Diet (Regular)  AMBULATORY STATUS COMMUNICATION OF NEEDS Skin   Extensive Assist Verbally Other (Comment)                       Personal Care Assistance Level of Assistance  Bathing, Feeding, Dressing Bathing Assistance: Maximum assistance Feeding assistance: Independent Dressing Assistance: Maximum assistance     Functional Limitations Info  Sight, Hearing, Speech Sight Info: Adequate Hearing Info: Adequate Speech Info: Adequate    SPECIAL CARE FACTORS FREQUENCY  PT (By licensed PT), OT (By licensed OT)     PT Frequency: 5 times per week OT Frequency: 5 times per week            Contractures Contractures Info: Not present    Additional Factors Info  Code Status, Allergies Code Status Info: Full Allergies Info: Ativan (lorazepam)           Current Medications (04/04/2022):  This is the current hospital active medication list Current Facility-Administered Medications  Medication Dose Route Frequency Provider Last Rate Last Admin   acetaminophen (TYLENOL) tablet 650 mg  650 mg Oral BID PRN Hinda Kehr, MD       ascorbic acid (VITAMIN C) tablet 250 mg  250 mg Oral Titus Dubin, MD   250 mg at 04/03/22 7619   atorvastatin (LIPITOR) tablet 20 mg  20 mg Oral Daily Hinda Kehr, MD   20 mg at 04/04/22 0925   carvedilol (COREG) tablet 25 mg  25 mg Oral BID WC Hinda Kehr, MD   25 mg at  04/04/22 0925   cholecalciferol (VITAMIN D3) 25 MCG (1000 UNIT) tablet 1,000 Units  1,000 Units Oral Melinda Crutch, MD   1,000 Units at 04/03/22 0941   dorzolamide-timolol (COSOPT) 2-0.5 % ophthalmic solution 1 drop  1 drop Both Eyes BID Loleta Rose, MD   1 drop at 04/04/22 1124   enoxaparin (LOVENOX) injection 70 mg  70 mg Subcutaneous Q24H Hallaji, Sheema M, RPH   70 mg at 04/04/22 1123   furosemide (LASIX) tablet 40 mg  40 mg Oral Daily Loleta Rose, MD   40 mg at 04/04/22 1660   insulin aspart (novoLOG) injection 0-20 Units   0-20 Units Subcutaneous TID [facility] Loleta Rose, MD   3 Units at 04/04/22 0931   insulin glargine-yfgn (SEMGLEE) injection 20 Units  20 Units Subcutaneous BID Loleta Rose, MD   20 Units at 04/03/22 2111   levETIRAcetam (KEPPRA) tablet 750 mg  750 mg Oral BID Loleta Rose, MD   750 mg at 04/04/22 1121   linagliptin (TRADJENTA) tablet 5 mg  5 mg Oral Daily Loleta Rose, MD   5 mg at 04/04/22 1121   loratadine (CLARITIN) tablet 10 mg  10 mg Oral Daily PRN Loleta Rose, MD       nystatin (MYCOSTATIN/NYSTOP) topical powder 1 Application  1 Application Topical Daily PRN Loleta Rose, MD       sacubitril-valsartan (ENTRESTO) 97-103 mg per tablet  1 tablet Oral BID Loleta Rose, MD   1 tablet at 04/04/22 1122   spironolactone (ALDACTONE) tablet 12.5 mg  12.5 mg Oral Daily Loleta Rose, MD   12.5 mg at 04/04/22 1122   vitamin B-12 (CYANOCOBALAMIN) tablet 100 mcg  100 mcg Oral Melinda Crutch, MD   100 mcg at 04/03/22 6301   Current Outpatient Medications  Medication Sig Dispense Refill   acetaminophen (TYLENOL) 325 MG tablet Take 650 mg by mouth 2 (two) times daily as needed.     atorvastatin (LIPITOR) 20 MG tablet Take 20 mg by mouth daily.     carvedilol (COREG) 12.5 MG tablet Take 1 tablet (12.5 mg total) by mouth 2 (two) times daily with a meal. (Patient taking differently: Take 25 mg by mouth 2 (two) times daily with a meal.) 60 tablet 0   cholecalciferol (VITAMIN D3) 25 MCG (1000 UNIT) tablet Take 1,000 Units by mouth every other day.     dorzolamide-timolol (COSOPT) 22.3-6.8 MG/ML ophthalmic solution Place 1 drop into both eyes 2 (two) times daily.     furosemide (LASIX) 40 MG tablet Take 1 tablet (40 mg total) by mouth daily. 30 tablet    insulin glargine (LANTUS) 100 UNIT/ML injection Inject 0.13 mLs (13 Units total) into the skin 2 (two) times daily. (Patient taking differently: Inject 20 Units into the skin 2 (two) times daily.) 10 mL 11   insulin lispro (HUMALOG) 100 UNIT/ML injection  Inject 0.06 mLs (6 Units total) into the skin 3 (three) times daily with meals. Inject 10u under the skin three times daily at mealtimes (plus applicable sliding scale) - do not use if blood glucose <150 (Patient taking differently: Inject 6 Units into the skin 3 (three) times daily with meals. *SLIDING SCALE* Inject 10u under the skin three times daily at mealtimes (plus applicable sliding scale) - do not use if blood glucose <150) 10 mL 11   levETIRAcetam (KEPPRA) 750 MG tablet Take 750 mg by mouth 2 (two) times daily.     linagliptin (TRADJENTA) 5 MG TABS tablet Take 5 mg by  mouth daily.     loratadine (CLARITIN) 10 MG tablet Take 10 mg by mouth daily as needed for allergies.     nystatin (MYCOSTATIN/NYSTOP) powder Apply 1 application topically daily as needed.     sacubitril-valsartan (ENTRESTO) 97-103 MG Take 1 tablet by mouth 2 (two) times daily. 60 tablet 0   spironolactone (ALDACTONE) 25 MG tablet Take 0.5 tablets (12.5 mg total) by mouth daily. 30 tablet 0   vitamin B-12 (CYANOCOBALAMIN) 100 MCG tablet Take 100 mcg by mouth every other day.     vitamin C (ASCORBIC ACID) 250 MG tablet Take 250 mg by mouth every other day.     albuterol (VENTOLIN HFA) 108 (90 Base) MCG/ACT inhaler Inhale 2 puffs into the lungs once as needed for wheezing or shortness of breath (for Grade 3 or 4 hypersensitivity reaction). (Patient not taking: Reported on 04/03/2022)     cholestyramine (QUESTRAN) 4 g packet Take 1 packet (4 g total) by mouth 2 (two) times daily for 7 days. 14 each 0   hydrocerin (EUCERIN) CREA Apply 1 application topically daily. (Patient not taking: Reported on 04/03/2022) 454 g 0   metoprolol succinate (TOPROL-XL) 25 MG 24 hr tablet Take by mouth. (Patient not taking: Reported on 04/03/2022)       Discharge Medications: Please see discharge summary for a list of discharge medications.  Relevant Imaging Results:  Relevant Lab Results:   Additional Information SS# [id number]  Wound Care  orders:Wound care to LLE:  Cleanse with soap and water, rinse and dry. Moisturize leg and foot with house moisturizer, (pink topped tube in supply area), so not moisturize between toes.. Wrap from just below toes to just below knee with Kerlix roll gauze/paper tape. Top Kerlix with 6-inch ACE bandage applied in a similar manner.   Wound care to RLE:  Cleanse with soap and water, rinse and dry. Apply silver hydrofiber dressing (Aquacel Ag+ Advantage, Kellie Simmering [phone]) to wounds on right medial and lateral LE, top with ABD pad and secure by wrapping from just below toes to just below knee with Kerlix roll gauze/paper tape. Top Kerlix with 6-inch ACE bandage applied in a similar manner.  Shelbie Hutching, RN

## 2022-04-04 NOTE — ED Notes (Signed)
Pt ate 90% of meal tray.

## 2022-04-04 NOTE — ED Notes (Signed)
Pt pillow adjusted and warm blanket given per her request.

## 2022-04-05 LAB — CBG MONITORING, ED
Glucose-Capillary: 99 mg/dL (ref 70–99)
Glucose-Capillary: 117 mg/dL — ABNORMAL HIGH (ref 70–99)
Glucose-Capillary: 128 mg/dL — ABNORMAL HIGH (ref 70–99)
Glucose-Capillary: 203 mg/dL — ABNORMAL HIGH (ref 70–99)

## 2022-04-05 NOTE — ED Notes (Addendum)
Pt had bowel movement. This RN, Art therapist, and three students cleaned pt with bath wipes. Clean linens on bed. Clean gown on pt and new purewick placed.

## 2022-04-05 NOTE — Progress Notes (Signed)
Physical Therapy Treatment Patient Details Name: Grace Short. [place] MRN: [id number] DOB: 1945-04-30 Today's Date: 04/05/2022   History of Present Illness Pt is a 77 y/o who presented on 04/02/22 for RLE ulcer wound check & after telemedicine PCP visit the day prior with PCP concerned about pt's mobility & recommending SNF placement. Pt has been unable to dress her wounds or care for herself fully for the last few weeks 2/2 lymphedema & worsening wounds. PMH: morbid obesity, lymphedema in BLE, chronic wounds in BLE    PT Comments    On arrival pt states "Does this have to be today?"  She was very pleasant, but c/o that her LEs were still too swollen to try standing, Despite cuing she did not wish to even try moving in bed or sitting up.  She has very little ROM available in either knee and she does endorse that this is one reason she became dependent on the lift chair at home.  Pt did show good effort with supine exercises (O2 and HR stable t/o the effort) but does still have a lot of fluid as well as baseline ROM/strength making even typical supine exercises relatively difficult.  Good effort but pt still very functionally limited.  Continue with POC.    Recommendations for follow up therapy are one component of a multi-disciplinary discharge planning process, led by the attending physician.  Recommendations may be updated based on patient status, additional functional criteria and insurance authorization.  Follow Up Recommendations  Skilled nursing-short term rehab (<3 hours/day) Can patient physically be transported by private vehicle: No   Assistance Recommended at Discharge Frequent or constant Supervision/Assistance  Patient can return home with the following Two people to help with walking and/or transfers;Two people to help with bathing/dressing/bathroom;Help with stairs or ramp for entrance;Assist for transportation;Direct supervision/assist for financial management;Assistance with  cooking/housework   Equipment Recommendations  None recommended by PT    Recommendations for Other Services       Precautions / Restrictions Precautions Precautions: Fall Restrictions Weight Bearing Restrictions: No     Mobility  Bed Mobility               General bed mobility comments: Pt refuses mobility today, pleasant about it but unwilling today    Transfers                        Ambulation/Gait                   Stairs             Wheelchair Mobility    Modified Rankin (Stroke Patients Only)       Balance                                            Cognition Arousal/Alertness: Awake/alert Behavior During Therapy: WFL for tasks assessed/performed Overall Cognitive Status: Within Functional Limits for tasks assessed                                          Exercises General Exercises - Lower Extremity Ankle Circles/Pumps: 10 reps, Strengthening Quad Sets: Strengthening, 10 reps Heel Slides: AAROM, 10 reps (Pt with limited knee ROM b/l, R <10*, L to ~40*.  Lightly resisted  leg extensions per tolerance) Hip ABduction/ADduction: AAROM, 10 reps Straight Leg Raises: AAROM (able to get L LE lifting a little with mod assist, unable to lift on R.)    General Comments General comments (skin integrity, edema, etc.): Pt in NAD on arrival, reports that she has gotten a lot of fluid off but that her legs are still too swollen to try getting up or even doing bed mobility.  Ultimately she finally agreed to doing some supine exercises.      Pertinent Vitals/Pain Pain Assessment Pain Assessment: Faces Faces Pain Scale: Hurts even more Pain Location: BLE - reports minimal pain at rest, but grimacing with nearly all LE movement    Home Living                          Prior Function            PT Goals (current goals can now be found in the care plan section) Progress towards PT goals:  Progressing toward goals    Frequency    Min 2X/week      PT Plan Current plan remains appropriate    Co-evaluation              AM-PAC PT "6 Clicks" Mobility   Outcome Measure  Help needed turning from your back to your side while in a flat bed without using bedrails?: Total Help needed moving from lying on your back to sitting on the side of a flat bed without using bedrails?: Total Help needed moving to and from a bed to a chair (including a wheelchair)?: Total Help needed standing up from a chair using your arms (e.g., wheelchair or bedside chair)?: Total Help needed to walk in hospital room?: Total Help needed climbing 3-5 steps with a railing? : Total 6 Click Score: 6    End of Session   Activity Tolerance: Patient tolerated treatment well Patient left: in bed;with call bell/phone within reach   PT Visit Diagnosis: Muscle weakness (generalized) (M62.81);Difficulty in walking, not elsewhere classified (R26.2)     Time: 4034-7425 PT Time Calculation (min) (ACUTE ONLY): 18 min  Charges:  $Therapeutic Exercise: 8-22 mins                     Kreg Shropshire, DPT 04/05/2022, 6:09 PM

## 2022-04-05 NOTE — ED Provider Notes (Signed)
-----------------------------------------   10:57 AM on 04/05/2022 -----------------------------------------   Blood pressure (!) 144/48, pulse 65, temperature 98.2 F (36.8 C), temperature source Oral, resp. rate 16, height 5\' 2"  (1.575 m), weight (!) 147 kg, SpO2 93 %.  The patient is calm and cooperative at this time.  There have been no acute events since the last update.  Awaiting disposition plan from case management/social work.    Nathaniel Man, MD 04/05/22 1057

## 2022-04-05 NOTE — TOC Progression Note (Signed)
Transition of Care (TOC) - Progression Note    Patient Details  Name: Grace Short. Grace Short MRN: [id number] Date of Birth: [birth year]  Transition of Care [facility]) CM/SW Contact  Shelbie Hutching, RN Phone Number: 04/05/2022, 3:16 PM  Clinical Narrative:    [place] started insurance authorization this morning.  Checking with [place] to see if any update on auth.    Expected Discharge Plan: [place] Barriers to Discharge: Continued Medical Work up  Expected Discharge Plan and Services       Living arrangements for the past 2 months: Single Family Home                                       Social Determinants of Health (SDOH) Interventions SDOH Screenings   Tobacco Use: Low Risk  (03/28/2021)    Readmission Risk Interventions     No data to display

## 2022-04-06 LAB — CBG MONITORING, ED
Glucose-Capillary: 143 mg/dL — ABNORMAL HIGH (ref 70–99)
Glucose-Capillary: 140 mg/dL — ABNORMAL HIGH (ref 70–99)
Glucose-Capillary: 90 mg/dL (ref 70–99)
Glucose-Capillary: 129 mg/dL — ABNORMAL HIGH (ref 70–99)

## 2022-04-06 NOTE — ED Notes (Signed)
Pt cleaned of stool, new chucks pad placed, purewick unsoiled.

## 2022-04-06 NOTE — TOC Progression Note (Signed)
Transition of Care (TOC) - Progression Note    Patient Details  Name: Grace Short MRN: [id number] Date of Birth: January 28, 1946  Transition of Care [facility]) CM/SW Contact  Shelbie Hutching, RN Phone Number: 04/06/2022, 1:44 PM  Clinical Narrative:    Patient received a call from [place] at [facility], she reports that the patient will have a 30% copay because they are out of network.  Spoke with patient about this she says call Katrina.  RNCM talked with Katrina, she doesn't understand why she has a copay as the plan says that she shouldn't.  I looks like [employer] has the patient on a nursing home plan as if she lives at a nursing home.  Patient lives with her roommate.  She was at one time at [facility].  Katrina called [employer] and they fixed her address.  [employer] also says that patient has 2 active policies- asked Tiffany at [employer] to run [employer] as the first [facility] plan is out of network with them as well.  Tiffany cannot pull any info up with the Member ID [id number].    Tiffany says she will get with her business office to see if they can pull anything up.    Expected Discharge Plan: [place] Barriers to Discharge: Continued Medical Work up  Expected Discharge Plan and Services       Living arrangements for the past 2 months: Single Family Home                                       Social Determinants of Health (SDOH) Interventions SDOH Screenings   Tobacco Use: Low Risk  (03/28/2021)    Readmission Risk Interventions     No data to display

## 2022-04-06 NOTE — ED Notes (Signed)
Pt cleaned of stool. Purewick replaced.

## 2022-04-06 NOTE — ED Notes (Signed)
Pt complaining of diarrhea, pt has had two loose stools so far today. Isaacs MD, made aware.

## 2022-04-06 NOTE — ED Provider Notes (Signed)
-----------------------------------------   6:29 AM on 04/06/2022 -----------------------------------------   Blood pressure 138/70, pulse 65, temperature 98 F (36.7 C), temperature source Oral, resp. rate 18, height 5\' 2"  (1.575 m), weight (!) 147 kg, SpO2 94 %.  The patient is calm and cooperative at this time.  There have been no acute events since the last update.  Awaiting disposition plan from Social Work team.   Paulette Blanch, MD 04/06/22 [phone]

## 2022-04-06 NOTE — TOC Progression Note (Signed)
Transition of Care (TOC) - Progression Note    Patient Details  Name: Grace Short. [name] MRN: [id number] Date of Birth: 03-03-1946  Transition of Care [facility]) CM/SW Contact  Shelbie Hutching, RN Phone Number: 04/06/2022, 2:18 PM  Clinical Narrative:    [facility] Medicare Plan they report that [facility] is in-network.  [facility] put considering offer of bed, called and left a message for admissions to return call.  Katrina agrees with [facility] since she doesn't think they can afford the 30% out of network copay.     Expected Discharge Plan: [place] Barriers to Discharge: Continued Medical Work up  Expected Discharge Plan and Services       Living arrangements for the past 2 months: Single Family Home                                       Social Determinants of Health (SDOH) Interventions SDOH Screenings   Tobacco Use: Low Risk  (03/28/2021)    Readmission Risk Interventions     No data to display

## 2022-04-06 NOTE — ED Notes (Signed)
Pt cleaned of stool, new chucks placed. Purewick unsoiled, remains in place.

## 2022-04-07 LAB — CBG MONITORING, ED
Glucose-Capillary: 113 mg/dL — ABNORMAL HIGH (ref 70–99)
Glucose-Capillary: 143 mg/dL — ABNORMAL HIGH (ref 70–99)
Glucose-Capillary: 149 mg/dL — ABNORMAL HIGH (ref 70–99)
Glucose-Capillary: 151 mg/dL — ABNORMAL HIGH (ref 70–99)

## 2022-04-07 NOTE — Progress Notes (Signed)
Occupational Therapy Treatment Patient Details Name: Grace Short. [place] MRN: [id number] DOB: 11-May-1945 Today's Date: 04/07/2022   History of present illness Pt is a 77 y/o who presented on 04/02/22 for RLE ulcer wound check & after telemedicine PCP visit the day prior with PCP concerned about pt's mobility & recommending SNF placement. Pt has been unable to dress her wounds or care for herself fully for the last few weeks 2/2 lymphedema & worsening wounds. PMH: morbid obesity, lymphedema in BLE, chronic wounds in BLE   OT comments  Ms. Sarsfield continues to be very limited in her fxl mobility although does appear to have slightly less LE edema and pain than during OT session earlier this week. Pt endorses severe pain with any movement of R LE, weeping from R LE slightly reduced this day. Pt was able to engage in ~ 10 minutes of UE therex in supine. She performs rolling in bed with Max A, able to reach cross-lateral bed rails and to move trunk, shoulders to L and R but unable to move b/l LE. She was more tolerant of movement, participation in therapy than she was a few days ago. Discussed importance of taking any opportunity to move, no matter how limited, and provided instruction in UE and LE exercises that can be performed in supine, with pt endorsing understanding and able to provide teach-back. Continue to recommend DC to SNF. Early minutes of OT session overlapped with PT.    Recommendations for follow up therapy are one component of a multi-disciplinary discharge planning process, led by the attending physician.  Recommendations may be updated based on patient status, additional functional criteria and insurance authorization.    Follow Up Recommendations  Skilled nursing-short term rehab (<3 hours/day)     Assistance Recommended at Discharge Frequent or constant Supervision/Assistance  Patient can return home with the following  Two people to help with walking and/or transfers;Two people to help with  bathing/dressing/bathroom;Assistance with cooking/housework;Help with stairs or ramp for entrance;Assist for transportation   Equipment Recommendations       Recommendations for Other Services      Precautions / Restrictions Precautions Precautions: Fall Restrictions Weight Bearing Restrictions: No       Mobility Bed Mobility Overal bed mobility: Needs Assistance Bed Mobility: Rolling Rolling: Max assist         General bed mobility comments: Pt able to provide minor assistance for rolling L & R in supine, can turn more easily towards R than towards L side. C/o R shoulder pain with attempts to roll to L.    Transfers                   General transfer comment: unable     Balance Overall balance assessment: Needs assistance   Sitting balance-Leahy Scale: Zero       Standing balance-Leahy Scale: Zero                             ADL either performed or assessed with clinical judgement   ADL                                              Extremity/Trunk Assessment Upper Extremity Assessment Upper Extremity Assessment: Generalized weakness   Lower Extremity Assessment Lower Extremity Assessment: Generalized weakness  Vision       Perception     Praxis      Cognition Arousal/Alertness: Awake/alert Behavior During Therapy: WFL for tasks assessed/performed Overall Cognitive Status: Within Functional Limits for tasks assessed                                          Exercises Other Exercises Other Exercises: Educ re: PoC, DC recs, home safety, importance of OOB movement    Shoulder Instructions       General Comments Pt c/o pain w/ essentially all movement, especially tender w/ R LE touch or movement    Pertinent Vitals/ Pain       Pain Assessment Pain Score: 8  Pain Location: BLE - reports minimal pain at rest, but grimacing with nearly all LE movement. R > L Pain Descriptors /  Indicators: Aching, Discomfort, Moaning, Grimacing, Guarding Pain Intervention(s): Limited activity within patient's tolerance, Repositioned, Monitored during session  Home Living                                          Prior Functioning/Environment              Frequency  Min 2X/week        Progress Toward Goals  OT Goals(current goals can now be found in the care plan section)  Progress towards OT goals: Progressing toward goals  Acute Rehab OT Goals OT Goal Formulation: With patient Time For Goal Achievement: 04/18/22 Potential to Achieve Goals: Good  Plan Discharge plan remains appropriate;Frequency remains appropriate    Co-evaluation                 AM-PAC OT "6 Clicks" Daily Activity     Outcome Measure   Help from another person eating meals?: A Little Help from another person taking care of personal grooming?: A Lot Help from another person toileting, which includes using toliet, bedpan, or urinal?: Total Help from another person bathing (including washing, rinsing, drying)?: A Lot Help from another person to put on and taking off regular upper body clothing?: A Lot Help from another person to put on and taking off regular lower body clothing?: Total 6 Click Score: 11    End of Session    OT Visit Diagnosis: Unsteadiness on feet (R26.81);Other abnormalities of gait and mobility (R26.89);Muscle weakness (generalized) (M62.81);Pain   Activity Tolerance Patient limited by pain;Treatment limited secondary to medical complications (Comment) (limited by LE edema, debility, obesity)   Patient Left in bed;with call bell/phone within reach   Nurse Communication          Time: 4193-7902 OT Time Calculation (min): 24 min  Charges: OT Treatments $Self Care/Home Management : 23-37 mins Josiah Lobo, PhD, MS, OTR/L 04/07/22, 4:01 PM

## 2022-04-07 NOTE — TOC Progression Note (Signed)
Transition of Care (TOC) - Progression Note    Patient Details  Name: Grace Short MRN: [id number] Date of Birth: 31-Jan-1946  Transition of Care [facility]) CM/SW Contact  Shelbie Hutching, RN Phone Number: 04/07/2022, 2:08 PM  Clinical Narrative:    Never heard back from [facility].  Reached out to [facility], they can accept the patient and report that they are in network with [facility].  Kitty in admissions will start auth once updated PT and OT notes have been received.  Reached out to PT and OT to see if they can work with her today.  They say they will try.  Called and left a message for Katrina for call back to update her on progress.    Expected Discharge Plan: [place] Barriers to Discharge: Continued Medical Work up  Expected Discharge Plan and Services       Living arrangements for the past 2 months: Single Family Home                                       Social Determinants of Health (SDOH) Interventions SDOH Screenings   Tobacco Use: Low Risk  (04/07/2022)    Readmission Risk Interventions     No data to display

## 2022-04-07 NOTE — Progress Notes (Signed)
Physical Therapy Treatment Patient Details Name: Grace Short MRN: [id number] DOB: 1945/06/24 Today's Date: 04/07/2022   History of Present Illness Pt is a 77 y/o who presented on 04/02/22 for RLE ulcer wound check & after telemedicine PCP visit the day prior with PCP concerned about pt's mobility & recommending SNF placement. Pt has been unable to dress her wounds or care for herself fully for the last few weeks 2/2 lymphedema & worsening wounds. PMH: morbid obesity, lymphedema in BLE, chronic wounds in BLE    PT Comments    Pt continues to be pleasant but remains hesitant to do a lot due to continued LE swelling, pain.  She was better able to participate with exercises today vs last session with this PT 2 days ago... however R knee still remains very ROM/pain limited and both very swollen. Pt remains hesitant to try a lot of mobility but with cuing did agree to try some sitting.  She needed heavy assist to get LEs to EOB, did relatively well with raising trunk up to sitting with min/mod HHA but did not tolerate sitting long (~90sec) before asking for assist back to supine.  Pt continues to remain functionally quite limited but showing increased effort and tolerance with PT sessions.  Pt working with OT at end of session, eager to continue working on mobility, function at rehab when medically cleared for d/c.    Recommendations for follow up therapy are one component of a multi-disciplinary discharge planning process, led by the attending physician.  Recommendations may be updated based on patient status, additional functional criteria and insurance authorization.  Follow Up Recommendations  Skilled nursing-short term rehab (<3 hours/day) Can patient physically be transported by private vehicle: No   Assistance Recommended at Discharge Frequent or constant Supervision/Assistance  Patient can return home with the following Two people to help with walking and/or transfers;Two people to help with  bathing/dressing/bathroom;Help with stairs or ramp for entrance;Assist for transportation;Direct supervision/assist for financial management;Assistance with cooking/housework   Equipment Recommendations  None recommended by PT    Recommendations for Other Services       Precautions / Restrictions Precautions Precautions: Fall Restrictions Weight Bearing Restrictions: No     Mobility  Bed Mobility Overal bed mobility: Needs Assistance Bed Mobility: Rolling, Supine to Sit, Sit to Supine Rolling: Max assist   Supine to sit: Max assist Sit to supine: Max assist   General bed mobility comments: Good effort, but ultimately needing heavy assist with all aspects of mobility.  total assist getting b/l LEs toward EOB, HHA with min/mod phsyical assist to elevate trunk to sitting once LEs at EOB.  Pt clearly uncomfortable sitting EOB due to LE swelling and ROM limitations - able to tolerate sitting ~90 seconds before requesting assist to get back to supine.  Again heavy assist with all aspects of the transfer back to supine and trying to get appropriately scooted in the bed.    Transfers                   General transfer comment: Pt not appropriate to trial standing attempt this date, struggled with maintaining sitting, unable to get LEs underneather her to safely positiong for transfer    Ambulation/Gait                   Stairs             Wheelchair Mobility    Modified Rankin (Stroke Patients Only)  Balance Overall balance assessment: Needs assistance Sitting-balance support: Feet unsupported, Bilateral upper extremity supported Sitting balance-Leahy Scale: Fair       Standing balance-Leahy Scale:  (unable to attempt)                              Cognition Arousal/Alertness: Awake/alert Behavior During Therapy: WFL for tasks assessed/performed Overall Cognitive Status: Within Functional Limits for tasks assessed                                           Exercises General Exercises - Lower Extremity Ankle Circles/Pumps: 10 reps, Strengthening Quad Sets: Strengthening, 10 reps Heel Slides: AAROM, 10 reps (again limited tolerance with knee ROM b/l, R to <20*, L to ~60* today) Hip ABduction/ADduction: AAROM, 10 reps (some near AROM on L, R AA/PROM only)    General Comments General comments (skin integrity, edema, etc.): Pt c/o pain with essentially all movement, especially tender with any R LE palpation, assist, movement.      Pertinent Vitals/Pain Pain Assessment Faces Pain Scale: Hurts whole lot Pain Location: BLE - reports minimal pain at rest, but grimacing with nearly all LE movement. R > L    Home Living                          Prior Function            PT Goals (current goals can now be found in the care plan section) Progress towards PT goals: Progressing toward goals    Frequency    Min 2X/week      PT Plan Current plan remains appropriate    Co-evaluation              AM-PAC PT "6 Clicks" Mobility   Outcome Measure  Help needed turning from your back to your side while in a flat bed without using bedrails?: A Lot Help needed moving from lying on your back to sitting on the side of a flat bed without using bedrails?: Total Help needed moving to and from a bed to a chair (including a wheelchair)?: Total Help needed standing up from a chair using your arms (e.g., wheelchair or bedside chair)?: Total Help needed to walk in hospital room?: Total Help needed climbing 3-5 steps with a railing? : Total 6 Click Score: 7    End of Session   Activity Tolerance: Patient limited by pain;Patient limited by fatigue Patient left: in bed;with call bell/phone within reach Nurse Communication: Mobility status (pt with questions about lymphedema wrapping) PT Visit Diagnosis: Muscle weakness (generalized) (M62.81);Difficulty in walking, not elsewhere classified  (R26.2)     Time: 5885-0277 PT Time Calculation (min) (ACUTE ONLY): 26 min  Charges:  $Therapeutic Exercise: 8-22 mins $Therapeutic Activity: 8-22 mins                     Kreg Shropshire, DPT 04/07/2022, 3:34 PM

## 2022-04-07 NOTE — TOC Progression Note (Signed)
Transition of Care (TOC) - Progression Note    Patient Details  Name: Grace Short MRN: [id number] Date of Birth: 05-Feb-1946  Transition of Care [facility]) CM/SW Contact  Shelbie Hutching, RN Phone Number: 04/07/2022, 11:27 AM  Clinical Narrative:    RNCM has reached out to [facility] and left multiple messages for admissions to return call.  Reaching out to [facility] to see if they can review and offer.  Problem seems to be finding a facility that is in network with patient's [facility] plan.  [facility].     Expected Discharge Plan: [place] Barriers to Discharge: Continued Medical Work up  Expected Discharge Plan and Services       Living arrangements for the past 2 months: Single Family Home                                       Social Determinants of Health (SDOH) Interventions SDOH Screenings   Tobacco Use: Low Risk  (03/28/2021)    Readmission Risk Interventions     No data to display

## 2022-04-07 NOTE — ED Notes (Signed)
Report given to Hedwig Morton RN of ED at this time. No questions, comments, or concerns after report was given.

## 2022-04-08 LAB — CBG MONITORING, ED
Glucose-Capillary: 147 mg/dL — ABNORMAL HIGH (ref 70–99)
Glucose-Capillary: 171 mg/dL — ABNORMAL HIGH (ref 70–99)
Glucose-Capillary: 116 mg/dL — ABNORMAL HIGH (ref 70–99)

## 2022-04-08 NOTE — ED Notes (Signed)
BG [id number]

## 2022-04-08 NOTE — ED Notes (Signed)
Patient report given to [place], RN

## 2022-04-08 NOTE — ED Notes (Addendum)
Messaged pharmacy for missing medications.

## 2022-04-08 NOTE — TOC Progression Note (Signed)
Transition of Care (TOC) - Progression Note    Patient Details  Name: Grace Short. [place] MRN: [id number] Date of Birth: 1946-01-16  Transition of Care [facility]) CM/SW Contact  Shelbie Hutching, RN Phone Number: 04/08/2022, 2:46 PM  Clinical Narrative:     SNF authorization pending for [facility].  Kitty at [facility] says they probably wont get it back til Monday.  Roommate Katrina updated that [facility] is pending.     Expected Discharge Plan: [place] Barriers to Discharge: Continued Medical Work up  Expected Discharge Plan and Services       Living arrangements for the past 2 months: Single Family Home                                       Social Determinants of Health (SDOH) Interventions SDOH Screenings   Tobacco Use: Low Risk  (04/07/2022)    Readmission Risk Interventions     No data to display

## 2022-04-08 NOTE — ED Provider Notes (Signed)
-----------------------------------------   4:57 AM on 04/08/2022 -----------------------------------------   Blood pressure (!) 179/60, pulse 64, temperature 98.8 F (37.1 C), resp. rate 16, height 1.575 m (5\' 2" ), weight (!) 147 kg, SpO2 93 %.  The patient is calm and cooperative at this time.  There have been no acute events since the last update.  Awaiting disposition plan from [facility] team.   Hinda Kehr, MD 04/08/22 [phone]

## 2022-04-08 NOTE — ED Notes (Signed)
Patient has been repositioned in bed.  She has been cleaned, and her purewick has been replaced.

## 2022-04-08 NOTE — ED Notes (Signed)
Meal tray delivered.

## 2022-04-08 NOTE — ED Notes (Signed)
Patient was cleaned, new pad placed underneath her, and she was repositioned in the bed

## 2022-04-09 LAB — CBG MONITORING, ED
Glucose-Capillary: 116 mg/dL — ABNORMAL HIGH (ref 70–99)
Glucose-Capillary: 116 mg/dL — ABNORMAL HIGH (ref 70–99)
Glucose-Capillary: 203 mg/dL — ABNORMAL HIGH (ref 70–99)
Glucose-Capillary: 127 mg/dL — ABNORMAL HIGH (ref 70–99)

## 2022-04-09 NOTE — ED Provider Notes (Signed)
-----------------------------------------   7:02 AM on 04/09/2022 -----------------------------------------   Blood pressure (!) 139/54, pulse 62, temperature 98 F (36.7 C), temperature source Oral, resp. rate 18, height 1.575 m (5\' 2" ), weight (!) 147 kg, SpO2 93 %.  The patient is calm and cooperative at this time.  There have been no acute events since the last update.  Awaiting disposition plan from [facility] team.   Hinda Kehr, MD 04/09/22 [phone]

## 2022-04-09 NOTE — ED Notes (Signed)
Pt had a BM. Pericare provided, new brief, new linen, and new purewick placed.

## 2022-04-09 NOTE — ED Notes (Signed)
Pt soiled, pt cleaned and linens changed.

## 2022-04-10 LAB — URINALYSIS, ROUTINE W REFLEX MICROSCOPIC
Bilirubin Urine: NEGATIVE
Glucose, UA: NEGATIVE mg/dL
Hgb urine dipstick: NEGATIVE
Ketones, ur: NEGATIVE mg/dL
Leukocytes,Ua: NEGATIVE
Nitrite: NEGATIVE
Protein, ur: NEGATIVE mg/dL
Specific Gravity, Urine: 1.016 (ref 1.005–1.030)
pH: 8 (ref 5.0–8.0)

## 2022-04-10 LAB — CBG MONITORING, ED
Glucose-Capillary: 111 mg/dL — ABNORMAL HIGH (ref 70–99)
Glucose-Capillary: 152 mg/dL — ABNORMAL HIGH (ref 70–99)
Glucose-Capillary: 148 mg/dL — ABNORMAL HIGH (ref 70–99)
Glucose-Capillary: 150 mg/dL — ABNORMAL HIGH (ref 70–99)

## 2022-04-10 LAB — CREATININE, SERUM
Creatinine, Ser: 0.66 mg/dL (ref 0.44–1.00)
GFR, Estimated: >60 mL/min (ref >60)

## 2022-04-10 NOTE — ED Notes (Signed)
Pt sleeping. Turned lights down.

## 2022-04-10 NOTE — Progress Notes (Signed)
Physical Therapy Treatment Patient Details Name: Grace Short. Ayler MRN: [id number] DOB: 1945-12-26 Today's Date: 04/10/2022   History of Present Illness Pt is a 77 y/o who presented on 04/02/22 for RLE ulcer wound check & after telemedicine PCP visit the day prior with PCP concerned about pt's mobility & recommending SNF placement. Pt has been unable to dress her wounds or care for herself fully for the last few weeks 2/2 lymphedema & worsening wounds. PMH: morbid obesity, lymphedema in BLE, chronic wounds in BLE    PT Comments    Pt agrees on second attempt with encouragement.  She does participate in AAROM BLE x 10 in supine.  She does put in good effort and has good ROM for ranges.  She declined sitting EOB "I don't know about that."  Given no +2 available for attempt will defer for a later date.   Recommendations for follow up therapy are one component of a multi-disciplinary discharge planning process, led by the attending physician.  Recommendations may be updated based on patient status, additional functional criteria and insurance authorization.  Follow Up Recommendations  Skilled nursing-short term rehab (<3 hours/day) Can patient physically be transported by private vehicle: No   Assistance Recommended at Discharge Frequent or constant Supervision/Assistance  Patient can return home with the following Two people to help with walking and/or transfers;Two people to help with bathing/dressing/bathroom;Help with stairs or ramp for entrance;Assist for transportation;Direct supervision/assist for financial management;Assistance with cooking/housework   Equipment Recommendations  None recommended by PT    Recommendations for Other Services       Precautions / Restrictions Precautions Precautions: Fall Restrictions Weight Bearing Restrictions: No     Mobility  Bed Mobility               General bed mobility comments: deferrred per pt request    Transfers                         Ambulation/Gait                   Stairs             Wheelchair Mobility    Modified Rankin (Stroke Patients Only)       Balance                                            Cognition Arousal/Alertness: Awake/alert Behavior During Therapy: WFL for tasks assessed/performed Overall Cognitive Status: Within Functional Limits for tasks assessed                                          Exercises General Exercises - Lower Extremity Ankle Circles/Pumps: 10 reps, Strengthening Quad Sets: Strengthening, 10 reps Heel Slides: AAROM, 10 reps (Pt with limited knee ROM b/l, R <20*, L to ~40.  Lightly resisted leg extensions per tolerance) Hip ABduction/ADduction: AAROM, 10 reps Straight Leg Raises: AAROM (able to get L LE lifting a little with mod assist, unable to lift on R.)    General Comments        Pertinent Vitals/Pain Pain Assessment Pain Assessment: Faces Faces Pain Scale: Hurts little more Pain Location: seems generally comfortable with supine AAROM today Pain Intervention(s): Monitored during session    Home  Living                          Prior Function            PT Goals (current goals can now be found in the care plan section) Progress towards PT goals: Not progressing toward goals - comment    Frequency    Min 2X/week      PT Plan Current plan remains appropriate    Co-evaluation              AM-PAC PT "6 Clicks" Mobility   Outcome Measure  Help needed turning from your back to your side while in a flat bed without using bedrails?: A Lot Help needed moving from lying on your back to sitting on the side of a flat bed without using bedrails?: Total Help needed moving to and from a bed to a chair (including a wheelchair)?: Total Help needed standing up from a chair using your arms (e.g., wheelchair or bedside chair)?: Total Help needed to walk in hospital room?:  Total Help needed climbing 3-5 steps with a railing? : Total 6 Click Score: 7    End of Session   Activity Tolerance: Patient limited by pain;Patient limited by fatigue Patient left: in bed;with call bell/phone within reach Nurse Communication: Mobility status (pt with questions about lymphedema wrapping) PT Visit Diagnosis: Muscle weakness (generalized) (M62.81);Difficulty in walking, not elsewhere classified (R26.2)     Time: 1300-1308 PT Time Calculation (min) (ACUTE ONLY): 8 min  Charges:  $Therapeutic Exercise: 8-22 mins                   Chesley Noon, PTA 04/10/22, 1:24 PM

## 2022-04-10 NOTE — ED Provider Notes (Signed)
-----------------------------------------   5:46 AM on 04/10/2022 -----------------------------------------   Blood pressure (!) 148/67, pulse 74, temperature 98.1 F (36.7 C), temperature source Oral, resp. rate 18, height 5\' 2"  (1.575 m), weight (!) 147 kg, SpO2 94 %.  The patient is calm and cooperative at this time.  There have been no acute events since the last update.  Awaiting disposition plan from case management/social work.    Idabelle Mcpeters, Delice Bison, DO 04/10/22 [phone]

## 2022-04-10 NOTE — ED Notes (Signed)
RN to bedside to answer call bell. Pt had small BM. Cleaned and new brief and purewick placed.

## 2022-04-10 NOTE — ED Notes (Signed)
Pt cleaned up after BM and new purewick placed

## 2022-04-10 NOTE — ED Notes (Addendum)
Rn to bedside to answer call bell. Pt was wet again. Purewick not in place correctly. Pt cleaned and replaced.   Urine is very dark and full of sediment and foul smelling. Will send to lab for analysis and make EdP aware.

## 2022-04-10 NOTE — ED Notes (Signed)
Pt requests coreg with 10 am meds.

## 2022-04-11 LAB — CBG MONITORING, ED
Glucose-Capillary: 198 mg/dL — ABNORMAL HIGH (ref 70–99)
Glucose-Capillary: 101 mg/dL — ABNORMAL HIGH (ref 70–99)
Glucose-Capillary: 137 mg/dL — ABNORMAL HIGH (ref 70–99)
Glucose-Capillary: 149 mg/dL — ABNORMAL HIGH (ref 70–99)
Glucose-Capillary: 185 mg/dL — ABNORMAL HIGH (ref 70–99)

## 2022-04-11 NOTE — ED Notes (Signed)
Round check is complete at this time. Pt is resting with no s/s of distress.

## 2022-04-11 NOTE — ED Notes (Signed)
Report given to Caitlin RN

## 2022-04-11 NOTE — Progress Notes (Signed)
Occupational Therapy Treatment Patient Details Name: Grace Short MRN: [id number] DOB: Jul 19, 1945 Today's Date: 04/11/2022   History of present illness Pt is a 77 y/o who presented on 04/02/22 for RLE ulcer wound check & after telemedicine PCP visit the day prior with PCP concerned about pt's mobility & recommending SNF placement. Pt has been unable to dress her wounds or care for herself fully for the last few weeks 2/2 lymphedema & worsening wounds. PMH: morbid obesity, lymphedema in BLE, chronic wounds in BLE   OT comments  Grace Short demonstrates improvement today: she denies pain at present -- a significant change, as last week she endorsed severe pain with essentially any movement or touch. She is able to roll in bed to L and R with slightly reduced need for assistance compared to several days ago. Therapist is able to perform PROM of b/l LE without the pt crying out in pain, and pt is able to assist minimally with L LE movement. She still has almost zero IND movement of R LE. Pt engages in UE therex in supine and is able to extend her reach beyond what she was able to do last week. Continue to recommend DC to SNF to assist pt in increasing her fxl mobility.    Recommendations for follow up therapy are one component of a multi-disciplinary discharge planning process, led by the attending physician.  Recommendations may be updated based on patient status, additional functional criteria and insurance authorization.    Follow Up Recommendations  Skilled nursing-short term rehab (<3 hours/day)     Assistance Recommended at Discharge Frequent or constant Supervision/Assistance  Patient can return home with the following  Two people to help with walking and/or transfers;Two people to help with bathing/dressing/bathroom;Assistance with cooking/housework;Help with stairs or ramp for entrance;Assist for transportation   Equipment Recommendations       Recommendations for Other Services       Precautions / Restrictions Precautions Precautions: Fall Restrictions Weight Bearing Restrictions: No       Mobility Bed Mobility Overal bed mobility: Needs Assistance Bed Mobility: Rolling Rolling: Max assist, Mod assist         General bed mobility comments: Max A rolling to L, Mod A rolling to R    Transfers                   General transfer comment: unable     Balance Overall balance assessment: Needs assistance Sitting-balance support: Feet unsupported, Bilateral upper extremity supported Sitting balance-Leahy Scale: Zero       Standing balance-Leahy Scale: Zero                             ADL either performed or assessed with clinical judgement   ADL                                              Extremity/Trunk Assessment Upper Extremity Assessment Upper Extremity Assessment: Generalized weakness   Lower Extremity Assessment Lower Extremity Assessment: Generalized weakness        Vision       Perception     Praxis      Cognition Arousal/Alertness: Awake/alert Behavior During Therapy: WFL for tasks assessed/performed Overall Cognitive Status: Within Functional Limits for tasks assessed  Exercises Other Exercises Other Exercises: Educ re: PoC, DC recs, home safety, importance of OOB movement    Shoulder Instructions       General Comments      Pertinent Vitals/ Pain       Pain Assessment Pain Assessment: No/denies pain  Home Living                                          Prior Functioning/Environment              Frequency  Min 2X/week        Progress Toward Goals  OT Goals(current goals can now be found in the care plan section)  Progress towards OT goals: Progressing toward goals  Acute Rehab OT Goals OT Goal Formulation: With patient Time For Goal Achievement: 04/18/22 Potential to Achieve Goals:  Good  Plan Discharge plan remains appropriate;Frequency remains appropriate    Co-evaluation                 AM-PAC OT "6 Clicks" Daily Activity     Outcome Measure   Help from another person eating meals?: None Help from another person taking care of personal grooming?: A Lot Help from another person toileting, which includes using toliet, bedpan, or urinal?: Total Help from another person bathing (including washing, rinsing, drying)?: A Lot Help from another person to put on and taking off regular upper body clothing?: A Lot Help from another person to put on and taking off regular lower body clothing?: Total 6 Click Score: 12    End of Session    OT Visit Diagnosis: Unsteadiness on feet (R26.81);Other abnormalities of gait and mobility (R26.89);Muscle weakness (generalized) (M62.81);Pain   Activity Tolerance Patient tolerated treatment well   Patient Left in bed   Nurse Communication          Time: 5102-5852 OT Time Calculation (min): 9 min  Charges: OT General Charges $OT Visit: 1 Visit  Josiah Lobo, PhD, MS, OTR/L 04/11/22, 2:14 PM

## 2022-04-11 NOTE — ED Notes (Signed)
Pt called out by call bell and asked to be changed. This nurse went in cleaned up the patient, changed ALL bed linen, changed purwick. Pt was cleaned with disposable wipes and washcloths with warm soapy water. Bearer cream was applied to crevices of the patient. Dry washcloths was applied under the pt's breast and under the abdomen.   There were noticeable break down on the right side of the patient under the abdomen.

## 2022-04-11 NOTE — ED Notes (Signed)
Round Check completed at this time. Pt is resting at this time w/ no s/s of distress.

## 2022-04-11 NOTE — ED Notes (Signed)
Set up portable suction for purewick. Pt denies needs at this time

## 2022-04-11 NOTE — TOC Progression Note (Signed)
Transition of Care (TOC) - Progression Note    Patient Details  Name: Grace Short. Silvestri MRN: [id number] Date of Birth: [birth year]  Transition of Care [facility]) CM/SW Contact  Shelbie Hutching, RN Phone Number: 04/11/2022, 12:27 PM  Clinical Narrative:    Perrin Smack says patient should be able to be admitted to [facility] today, she just needs to check with her business office.     Expected Discharge Plan: [place] Barriers to Discharge: Continued Medical Work up  Expected Discharge Plan and Services       Living arrangements for the past 2 months: Single Family Home                                       Social Determinants of Health (SDOH) Interventions SDOH Screenings   Tobacco Use: Low Risk  (04/07/2022)    Readmission Risk Interventions     No data to display

## 2022-04-11 NOTE — TOC Progression Note (Signed)
Transition of Care (TOC) - Progression Note    Patient Details  Name: Grace Short. Whitacre MRN: [id number] Date of Birth: [birth year]  Transition of Care [facility]) CM/SW Contact  Shelbie Hutching, RN Phone Number: 04/11/2022, 9:24 AM  Clinical Narrative:    Authorization pending, waiting to hear from [place] [facility] [place] [facility] [place] her this morning.    Expected Discharge Plan: [place] Barriers to Discharge: Continued Medical Work up  Expected Discharge Plan and Services       Living arrangements for the past 2 months: Single Family Home                                       Social Determinants of Health (SDOH) Interventions SDOH Screenings   Tobacco Use: Low Risk  (04/07/2022)    Readmission Risk Interventions     No data to display

## 2022-04-11 NOTE — ED Provider Notes (Signed)
Vitals:   04/10/22 1736 04/10/22 1742  BP: (P) 133/60 133/60  Pulse:  60  Resp:    Temp:    SpO2:       Patient currently sleeping in no distress.  Even unlabored respirations.  Disposition is pending per [facility] team.  Labs including urinalysis from yesterday reviewed, negative for evidence of acute infection.  Blood glucose 150   Delman Kitten, MD 04/11/22 [phone]

## 2022-04-11 NOTE — ED Notes (Signed)
Changed pt. New Brief, chucks and purewick. 2 person assist. Pt incontinent of stool. Pt denies further needs at this time.

## 2022-04-12 LAB — CBG MONITORING, ED
Glucose-Capillary: 94 mg/dL (ref 70–99)
Glucose-Capillary: 134 mg/dL — ABNORMAL HIGH (ref 70–99)

## 2022-04-12 MED ORDER — LOPERAMIDE HCL 2 MG PO CAPS
2.0000 mg | ORAL_CAPSULE | Freq: Once | ORAL | Status: AC
  Administered 2022-04-12: 2 mg via ORAL
  Filled 2022-04-12: qty 1

## 2022-04-12 NOTE — ED Notes (Signed)
[facility]  CALLED  FOR  TRANSPORT  TO  [facility]

## 2022-04-12 NOTE — ED Provider Notes (Signed)
-----------------------------------------   4:25 AM on 04/12/2022 -----------------------------------------   Blood pressure (!) 135/45, pulse 60, temperature 97.8 F (36.6 C), temperature source Oral, resp. rate 14, height 5\' 2"  (1.575 m), weight (!) 147 kg, SpO2 92 %.  The patient is calm and cooperative at this time.  There have been no acute events since the last update.  Awaiting disposition plan from case management/social work.    Adrijana Haros, Delice Bison, DO 04/12/22 [phone]

## 2022-04-12 NOTE — ED Notes (Signed)
Pharmacy to send up missing medications 

## 2022-04-12 NOTE — ED Provider Notes (Signed)
Vitals:   04/11/22 0922 04/11/22 1816  BP: (!) 150/46 (!) 135/45  Pulse: 60 60  Resp: 16 14  Temp: 97.8 F (36.6 C)   SpO2: 93% 92%    Patient resting in hallway at present.  TOC indicates potential placement at a facility [facility]" but we have not received final yet.   Patient is somewhat upset that she is currently having to board in the hallway.  Understandable, but unfortunately due to volume we do not have ability to place her in private room at this moment   Delman Kitten, MD 04/12/22 0422

## 2022-04-12 NOTE — ED Notes (Signed)
Pt had BM. Pericare, chuck pad and brief changed. Periwick replaced. Pt lifted up in bed.

## 2022-04-12 NOTE — TOC Transition Note (Signed)
Transition of Care (TOC) - CM/SW Discharge Note   Patient Details  Name: Grace Short MRN: [id number] Date of Birth: [birth year]  [facility]) CM/SW Contact:  Shelbie Hutching, RN Phone Number: 04/12/2022, 1:28 PM   Clinical Narrative:    [facility] patient today.  Patient will be going to room 230B.  ED RN will call report to [phone].  RNCM has called and updated Katrina that patient going to [place] today.   ED secretary will set up EMS transport.     Final next level of care: [place] Barriers to Discharge: Barriers Resolved   Patient Goals and CMS Choice      Discharge Placement                Patient chooses bed at: [facility] Patient to be transferred to facility by: [facility] Name of family member notified: Cammy Copa Patient and family notified of of transfer: 04/12/22  Discharge Plan and Services Additional resources added to the After Visit Summary for                                       Social Determinants of Health (SDOH) Interventions SDOH Screenings   Tobacco Use: Low Risk  (04/07/2022)     Readmission Risk Interventions     No data to display

## 2022-04-12 NOTE — ED Notes (Signed)
Pt given breakfast and set up for pt. Vitals reassessed and pt denies any current needs.

## 2022-04-12 NOTE — ED Provider Notes (Addendum)
-----------------------------------------   7:06 AM on 04/12/2022 -----------------------------------------   Blood pressure (!) 135/45, pulse 60, temperature 97.8 F (36.6 C), temperature source Oral, resp. rate 14, height 5\' 2"  (1.575 m), weight (!) 147 kg, SpO2 92 %.  The patient is calm and cooperative at this time.  There have been no acute events since the last update.  Awaiting disposition plan from case management/social work.  Plan to be discharged to assisted living facility.  Discharged in stable condition    Nathaniel Man, MD 04/12/22 0071    Nathaniel Man, MD 04/12/22 1037

## 2022-04-12 NOTE — ED Notes (Signed)
Pt had BM. Pericare. Purewick, brief, and bed pad changed.

## 2022-05-11 ENCOUNTER — Inpatient Hospital Stay (HOSPITAL_COMMUNITY): Payer: 59

## 2022-05-11 ENCOUNTER — Emergency Department (HOSPITAL_COMMUNITY): Payer: 59

## 2022-05-11 ENCOUNTER — Encounter (HOSPITAL_COMMUNITY): Payer: Self-pay

## 2022-05-11 ENCOUNTER — Inpatient Hospital Stay (HOSPITAL_COMMUNITY)
Admission: EM | Admit: 2022-05-11 | Discharge: 2022-06-06 | DRG: 870 | Disposition: E | Payer: 59 | Source: Skilled Nursing Facility | Attending: Emergency Medicine | Admitting: Emergency Medicine

## 2022-05-11 DIAGNOSIS — J441 Chronic obstructive pulmonary disease with (acute) exacerbation: Secondary | ICD-10-CM | POA: Diagnosis not present

## 2022-05-11 DIAGNOSIS — G40909 Epilepsy, unspecified, not intractable, without status epilepticus: Secondary | ICD-10-CM | POA: Diagnosis not present

## 2022-05-11 DIAGNOSIS — J9851 Mediastinitis: Secondary | ICD-10-CM | POA: Diagnosis present

## 2022-05-11 DIAGNOSIS — E8881 Metabolic syndrome: Secondary | ICD-10-CM | POA: Diagnosis present

## 2022-05-11 DIAGNOSIS — Z66 Do not resuscitate: Secondary | ICD-10-CM | POA: Diagnosis not present

## 2022-05-11 DIAGNOSIS — E1169 Type 2 diabetes mellitus with other specified complication: Secondary | ICD-10-CM | POA: Diagnosis present

## 2022-05-11 DIAGNOSIS — J123 Human metapneumovirus pneumonia: Secondary | ICD-10-CM | POA: Diagnosis present

## 2022-05-11 DIAGNOSIS — Z833 Family history of diabetes mellitus: Secondary | ICD-10-CM

## 2022-05-11 DIAGNOSIS — E1165 Type 2 diabetes mellitus with hyperglycemia: Secondary | ICD-10-CM | POA: Diagnosis present

## 2022-05-11 DIAGNOSIS — R579 Shock, unspecified: Secondary | ICD-10-CM

## 2022-05-11 DIAGNOSIS — I5032 Chronic diastolic (congestive) heart failure: Secondary | ICD-10-CM | POA: Diagnosis present

## 2022-05-11 DIAGNOSIS — Z8673 Personal history of transient ischemic attack (TIA), and cerebral infarction without residual deficits: Secondary | ICD-10-CM

## 2022-05-11 DIAGNOSIS — A419 Sepsis, unspecified organism: Secondary | ICD-10-CM | POA: Diagnosis present

## 2022-05-11 DIAGNOSIS — Z1152 Encounter for screening for COVID-19: Secondary | ICD-10-CM | POA: Diagnosis not present

## 2022-05-11 DIAGNOSIS — A4189 Other specified sepsis: Secondary | ICD-10-CM | POA: Diagnosis present

## 2022-05-11 DIAGNOSIS — Z794 Long term (current) use of insulin: Secondary | ICD-10-CM

## 2022-05-11 DIAGNOSIS — E8729 Other acidosis: Secondary | ICD-10-CM | POA: Diagnosis present

## 2022-05-11 DIAGNOSIS — R4182 Altered mental status, unspecified: Secondary | ICD-10-CM | POA: Diagnosis present

## 2022-05-11 DIAGNOSIS — I890 Lymphedema, not elsewhere classified: Secondary | ICD-10-CM | POA: Diagnosis present

## 2022-05-11 DIAGNOSIS — Z79899 Other long term (current) drug therapy: Secondary | ICD-10-CM

## 2022-05-11 DIAGNOSIS — Y95 Nosocomial condition: Secondary | ICD-10-CM | POA: Diagnosis present

## 2022-05-11 DIAGNOSIS — E662 Morbid (severe) obesity with alveolar hypoventilation: Secondary | ICD-10-CM | POA: Diagnosis present

## 2022-05-11 DIAGNOSIS — E875 Hyperkalemia: Secondary | ICD-10-CM | POA: Diagnosis not present

## 2022-05-11 DIAGNOSIS — R6520 Severe sepsis without septic shock: Secondary | ICD-10-CM

## 2022-05-11 DIAGNOSIS — Z6841 Body Mass Index (BMI) 40.0 and over, adult: Secondary | ICD-10-CM

## 2022-05-11 DIAGNOSIS — N170 Acute kidney failure with tubular necrosis: Secondary | ICD-10-CM | POA: Diagnosis present

## 2022-05-11 DIAGNOSIS — G40109 Localization-related (focal) (partial) symptomatic epilepsy and epileptic syndromes with simple partial seizures, not intractable, without status epilepticus: Secondary | ICD-10-CM | POA: Diagnosis present

## 2022-05-11 DIAGNOSIS — Z8249 Family history of ischemic heart disease and other diseases of the circulatory system: Secondary | ICD-10-CM

## 2022-05-11 DIAGNOSIS — R6521 Severe sepsis with septic shock: Secondary | ICD-10-CM | POA: Diagnosis not present

## 2022-05-11 DIAGNOSIS — I110 Hypertensive heart disease with heart failure: Secondary | ICD-10-CM | POA: Diagnosis present

## 2022-05-11 DIAGNOSIS — I87333 Chronic venous hypertension (idiopathic) with ulcer and inflammation of bilateral lower extremity: Secondary | ICD-10-CM | POA: Diagnosis present

## 2022-05-11 DIAGNOSIS — Z820 Family history of epilepsy and other diseases of the nervous system: Secondary | ICD-10-CM

## 2022-05-11 DIAGNOSIS — Z950 Presence of cardiac pacemaker: Secondary | ICD-10-CM

## 2022-05-11 DIAGNOSIS — I872 Venous insufficiency (chronic) (peripheral): Secondary | ICD-10-CM | POA: Diagnosis present

## 2022-05-11 DIAGNOSIS — J9621 Acute and chronic respiratory failure with hypoxia: Secondary | ICD-10-CM | POA: Diagnosis present

## 2022-05-11 DIAGNOSIS — J982 Interstitial emphysema: Secondary | ICD-10-CM | POA: Diagnosis present

## 2022-05-11 DIAGNOSIS — J9622 Acute and chronic respiratory failure with hypercapnia: Secondary | ICD-10-CM | POA: Diagnosis not present

## 2022-05-11 DIAGNOSIS — E785 Hyperlipidemia, unspecified: Secondary | ICD-10-CM | POA: Diagnosis present

## 2022-05-11 DIAGNOSIS — J9600 Acute respiratory failure, unspecified whether with hypoxia or hypercapnia: Secondary | ICD-10-CM

## 2022-05-11 DIAGNOSIS — I495 Sick sinus syndrome: Secondary | ICD-10-CM | POA: Diagnosis present

## 2022-05-11 DIAGNOSIS — Z888 Allergy status to other drugs, medicaments and biological substances status: Secondary | ICD-10-CM

## 2022-05-11 DIAGNOSIS — Z515 Encounter for palliative care: Secondary | ICD-10-CM

## 2022-05-11 DIAGNOSIS — E870 Hyperosmolality and hypernatremia: Secondary | ICD-10-CM | POA: Diagnosis not present

## 2022-05-11 DIAGNOSIS — G9341 Metabolic encephalopathy: Secondary | ICD-10-CM | POA: Diagnosis present

## 2022-05-11 DIAGNOSIS — Z9049 Acquired absence of other specified parts of digestive tract: Secondary | ICD-10-CM

## 2022-05-11 DIAGNOSIS — Z781 Physical restraint status: Secondary | ICD-10-CM

## 2022-05-11 DIAGNOSIS — R34 Anuria and oliguria: Secondary | ICD-10-CM | POA: Diagnosis present

## 2022-05-11 DIAGNOSIS — I10 Essential (primary) hypertension: Secondary | ICD-10-CM | POA: Diagnosis present

## 2022-05-11 DIAGNOSIS — R339 Retention of urine, unspecified: Secondary | ICD-10-CM | POA: Diagnosis present

## 2022-05-11 DIAGNOSIS — E871 Hypo-osmolality and hyponatremia: Secondary | ICD-10-CM | POA: Diagnosis not present

## 2022-05-11 DIAGNOSIS — Z7984 Long term (current) use of oral hypoglycemic drugs: Secondary | ICD-10-CM

## 2022-05-11 DIAGNOSIS — N179 Acute kidney failure, unspecified: Secondary | ICD-10-CM | POA: Diagnosis not present

## 2022-05-11 DIAGNOSIS — E876 Hypokalemia: Secondary | ICD-10-CM | POA: Diagnosis not present

## 2022-05-11 DIAGNOSIS — Z9981 Dependence on supplemental oxygen: Secondary | ICD-10-CM

## 2022-05-11 DIAGNOSIS — R0609 Other forms of dyspnea: Secondary | ICD-10-CM | POA: Diagnosis not present

## 2022-05-11 DIAGNOSIS — J189 Pneumonia, unspecified organism: Secondary | ICD-10-CM | POA: Diagnosis not present

## 2022-05-11 LAB — I-STAT ARTERIAL BLOOD GAS, ED
Acid-Base Excess: 2 mmol/L (ref 0.0–2.0)
Acid-Base Excess: 2 mmol/L (ref 0.0–2.0)
Acid-Base Excess: 2 mmol/L (ref 0.0–2.0)
Bicarbonate: 31.5 mmol/L — ABNORMAL HIGH (ref 20.0–28.0)
Bicarbonate: 30.7 mmol/L — ABNORMAL HIGH (ref 20.0–28.0)
Bicarbonate: 31.9 mmol/L — ABNORMAL HIGH (ref 20.0–28.0)
Calcium, Ion: 1.26 mmol/L (ref 1.15–1.40)
Calcium, Ion: 1.25 mmol/L (ref 1.15–1.40)
Calcium, Ion: 1.29 mmol/L (ref 1.15–1.40)
HCT: 37 % (ref 36.0–46.0)
HCT: 34 % — ABNORMAL LOW (ref 36.0–46.0)
HCT: 36 % (ref 36.0–46.0)
Hemoglobin: 12.6 g/dL (ref 12.0–15.0)
Hemoglobin: 11.6 g/dL — ABNORMAL LOW (ref 12.0–15.0)
Hemoglobin: 12.2 g/dL (ref 12.0–15.0)
O2 Saturation: 97 %
O2 Saturation: 95 %
O2 Saturation: 92 %
Patient temperature: 101.6
Patient temperature: 98
Patient temperature: 97.2
Potassium: 3.7 mmol/L (ref 3.5–5.1)
Potassium: 3.6 mmol/L (ref 3.5–5.1)
Potassium: 4.2 mmol/L (ref 3.5–5.1)
Sodium: 145 mmol/L (ref 135–145)
Sodium: 145 mmol/L (ref 135–145)
Sodium: 146 mmol/L — ABNORMAL HIGH (ref 135–145)
TCO2: 34 mmol/L — ABNORMAL HIGH (ref 22–32)
TCO2: 33 mmol/L — ABNORMAL HIGH (ref 22–32)
TCO2: 34 mmol/L — ABNORMAL HIGH (ref 22–32)
pCO2 arterial: 79.7 mmHg (ref 32–48)
pCO2 arterial: 71.5 mmHg (ref 32–48)
pCO2 arterial: 77.5 mmHg (ref 32–48)
pH, Arterial: 7.214 — ABNORMAL LOW (ref 7.35–7.45)
pH, Arterial: 7.238 — ABNORMAL LOW (ref 7.35–7.45)
pH, Arterial: 7.217 — ABNORMAL LOW (ref 7.35–7.45)
pO2, Arterial: 116 mmHg — ABNORMAL HIGH (ref 83–108)
pO2, Arterial: 92 mmHg (ref 83–108)
pO2, Arterial: 77 mmHg — ABNORMAL LOW (ref 83–108)

## 2022-05-11 LAB — CBC WITH DIFFERENTIAL/PLATELET
Abs Immature Granulocytes: 0.08 10*3/uL — ABNORMAL HIGH (ref 0.00–0.07)
Basophils Absolute: 0 10*3/uL (ref 0.0–0.1)
Basophils Relative: 0 %
Eosinophils Absolute: 0.1 10*3/uL (ref 0.0–0.5)
Eosinophils Relative: 1 %
HCT: 43 % (ref 36.0–46.0)
Hemoglobin: 12.6 g/dL (ref 12.0–15.0)
Immature Granulocytes: 1 %
Lymphocytes Relative: 19 %
Lymphs Abs: 1.4 10*3/uL (ref 0.7–4.0)
MCH: 26.1 pg (ref 26.0–34.0)
MCHC: 29.3 g/dL — ABNORMAL LOW (ref 30.0–36.0)
MCV: 89 fL (ref 80.0–100.0)
Monocytes Absolute: 0.7 10*3/uL (ref 0.1–1.0)
Monocytes Relative: 9 %
Neutro Abs: 5 10*3/uL (ref 1.7–7.7)
Neutrophils Relative %: 70 %
Platelets: 158 10*3/uL (ref 150–400)
RBC: 4.83 MIL/uL (ref 3.87–5.11)
RDW: 15.4 % (ref 11.5–15.5)
WBC: 7.2 10*3/uL (ref 4.0–10.5)
nRBC: 0 % (ref 0.0–0.2)

## 2022-05-11 LAB — COMPREHENSIVE METABOLIC PANEL
ALT: 12 U/L (ref 0–44)
AST: 15 U/L (ref 15–41)
Albumin: 3.2 g/dL — ABNORMAL LOW (ref 3.5–5.0)
Alkaline Phosphatase: 54 U/L (ref 38–126)
Anion gap: 4 — ABNORMAL LOW (ref 5–15)
BUN: 18 mg/dL (ref 8–23)
CO2: 35 mmol/L — ABNORMAL HIGH (ref 22–32)
Calcium: 8.8 mg/dL — ABNORMAL LOW (ref 8.9–10.3)
Chloride: 102 mmol/L (ref 98–111)
Creatinine, Ser: 0.89 mg/dL (ref 0.44–1.00)
GFR, Estimated: >60 mL/min (ref >60)
Glucose, Bld: 201 mg/dL — ABNORMAL HIGH (ref 70–99)
Potassium: 3.9 mmol/L (ref 3.5–5.1)
Sodium: 141 mmol/L (ref 135–145)
Total Bilirubin: 0.5 mg/dL (ref 0.3–1.2)
Total Protein: 7.2 g/dL (ref 6.5–8.1)

## 2022-05-11 LAB — URINALYSIS, W/ REFLEX TO CULTURE (INFECTION SUSPECTED)
Bilirubin Urine: NEGATIVE
Glucose, UA: NEGATIVE mg/dL
Ketones, ur: 5 mg/dL — AB
Leukocytes,Ua: NEGATIVE
Nitrite: NEGATIVE
Protein, ur: 30 mg/dL — AB
Specific Gravity, Urine: 1.018 (ref 1.005–1.030)
pH: 5 (ref 5.0–8.0)

## 2022-05-11 LAB — STREP PNEUMONIAE URINARY ANTIGEN: Strep Pneumo Urinary Antigen: NEGATIVE

## 2022-05-11 LAB — I-STAT CHEM 8, ED
BUN: 20 mg/dL (ref 8–23)
Calcium, Ion: 1.16 mmol/L (ref 1.15–1.40)
Chloride: 101 mmol/L (ref 98–111)
Creatinine, Ser: 0.9 mg/dL (ref 0.44–1.00)
Glucose, Bld: 203 mg/dL — ABNORMAL HIGH (ref 70–99)
HCT: 42 % (ref 36.0–46.0)
Hemoglobin: 14.3 g/dL (ref 12.0–15.0)
Potassium: 4 mmol/L (ref 3.5–5.1)
Sodium: 143 mmol/L (ref 135–145)
TCO2: 33 mmol/L — ABNORMAL HIGH (ref 22–32)

## 2022-05-11 LAB — I-STAT VENOUS BLOOD GAS, ED
Acid-Base Excess: 4 mmol/L — ABNORMAL HIGH (ref 0.0–2.0)
Bicarbonate: 33 mmol/L — ABNORMAL HIGH (ref 20.0–28.0)
Calcium, Ion: 1.16 mmol/L (ref 1.15–1.40)
HCT: 42 % (ref 36.0–46.0)
Hemoglobin: 14.3 g/dL (ref 12.0–15.0)
O2 Saturation: 58 %
Potassium: 4 mmol/L (ref 3.5–5.1)
Sodium: 143 mmol/L (ref 135–145)
TCO2: 35 mmol/L — ABNORMAL HIGH (ref 22–32)
pCO2, Ven: 69.9 mmHg — ABNORMAL HIGH (ref 44–60)
pH, Ven: 7.282 (ref 7.25–7.43)
pO2, Ven: 35 mmHg (ref 32–45)

## 2022-05-11 LAB — RESP PANEL BY RT-PCR (RSV, FLU A&B, COVID)  RVPGX2
Influenza A by PCR: NEGATIVE
Influenza B by PCR: NEGATIVE
Resp Syncytial Virus by PCR: NEGATIVE
SARS Coronavirus 2 by RT PCR: NEGATIVE

## 2022-05-11 LAB — RESPIRATORY PANEL BY PCR

## 2022-05-11 LAB — URINALYSIS, ROUTINE W REFLEX MICROSCOPIC
Bilirubin Urine: NEGATIVE
Glucose, UA: NEGATIVE mg/dL
Hgb urine dipstick: NEGATIVE
Ketones, ur: 5 mg/dL — AB
Leukocytes,Ua: NEGATIVE
Nitrite: NEGATIVE
Protein, ur: 30 mg/dL — AB
Specific Gravity, Urine: >1.046 — ABNORMAL HIGH (ref 1.005–1.030)
pH: 5 (ref 5.0–8.0)

## 2022-05-11 LAB — CBG MONITORING, ED
Glucose-Capillary: 147 mg/dL — ABNORMAL HIGH (ref 70–99)
Glucose-Capillary: 153 mg/dL — ABNORMAL HIGH (ref 70–99)

## 2022-05-11 LAB — MRSA NEXT GEN BY PCR, NASAL: MRSA by PCR Next Gen: NOT DETECTED

## 2022-05-11 LAB — GLUCOSE, CAPILLARY: Glucose-Capillary: 174 mg/dL — ABNORMAL HIGH (ref 70–99)

## 2022-05-11 LAB — LACTIC ACID, PLASMA
Lactic Acid, Venous: 1 mmol/L (ref 0.5–1.9)
Lactic Acid, Venous: 1 mmol/L (ref 0.5–1.9)

## 2022-05-11 LAB — PROCALCITONIN: Procalcitonin: <0.1 ng/mL

## 2022-05-11 LAB — BRAIN NATRIURETIC PEPTIDE: B Natriuretic Peptide: 33 pg/mL (ref 0.0–100.0)

## 2022-05-11 LAB — PROTIME-INR
INR: 1.1 (ref 0.8–1.2)
Prothrombin Time: 13.6 seconds (ref 11.4–15.2)

## 2022-05-11 LAB — MAGNESIUM: Magnesium: 1.9 mg/dL (ref 1.7–2.4)

## 2022-05-11 LAB — TROPONIN I (HIGH SENSITIVITY)
Troponin I (High Sensitivity): 17 ng/L (ref <18)
Troponin I (High Sensitivity): 21 ng/L — ABNORMAL HIGH (ref <18)

## 2022-05-11 LAB — AMMONIA: Ammonia: 24 umol/L (ref 9–35)

## 2022-05-11 MED ORDER — SODIUM CHLORIDE 0.9 % IV BOLUS (SEPSIS)
1000.0000 mL | Freq: Once | INTRAVENOUS | Status: AC
  Administered 2022-05-11: 1000 mL via INTRAVENOUS

## 2022-05-11 MED ORDER — ALBUTEROL SULFATE (2.5 MG/3ML) 0.083% IN NEBU
2.5000 mg | INHALATION_SOLUTION | RESPIRATORY_TRACT | Status: DC | PRN
  Administered 2022-05-12 – 2022-05-17 (×3): 2.5 mg via RESPIRATORY_TRACT
  Filled 2022-05-11 (×4): qty 3

## 2022-05-11 MED ORDER — CHLORHEXIDINE GLUCONATE CLOTH 2 % EX PADS
6.0000 | MEDICATED_PAD | Freq: Every day | CUTANEOUS | Status: DC
  Administered 2022-05-11 – 2022-05-19 (×9): 6 via TOPICAL

## 2022-05-11 MED ORDER — METHYLPREDNISOLONE SODIUM SUCC 40 MG IJ SOLR
40.0000 mg | Freq: Every day | INTRAMUSCULAR | Status: DC
  Administered 2022-05-11 – 2022-05-15 (×5): 40 mg via INTRAVENOUS
  Filled 2022-05-11 (×6): qty 1

## 2022-05-11 MED ORDER — ONDANSETRON HCL 4 MG/2ML IJ SOLN: 4.0000 mg | Freq: Four times a day (QID) | INTRAMUSCULAR | Status: DC | PRN

## 2022-05-11 MED ORDER — VANCOMYCIN HCL 10 G IV SOLR
2250.0000 mg | Freq: Once | INTRAVENOUS | Status: AC
  Administered 2022-05-11: 2250 mg via INTRAVENOUS
  Filled 2022-05-11: qty 2250

## 2022-05-11 MED ORDER — DEXMEDETOMIDINE HCL IN NACL 400 MCG/100ML IV SOLN
0.0000 ug/kg/h | INTRAVENOUS | Status: DC
  Administered 2022-05-11 – 2022-05-13 (×5): 0.4 ug/kg/h via INTRAVENOUS
  Administered 2022-05-13 – 2022-05-14 (×4): 0.6 ug/kg/h via INTRAVENOUS
  Administered 2022-05-14: 0.8 ug/kg/h via INTRAVENOUS
  Administered 2022-05-14 (×3): 0.6 ug/kg/h via INTRAVENOUS
  Administered 2022-05-15 (×2): 0.9 ug/kg/h via INTRAVENOUS
  Administered 2022-05-15: 0.8 ug/kg/h via INTRAVENOUS
  Administered 2022-05-15 (×2): 0.9 ug/kg/h via INTRAVENOUS
  Administered 2022-05-15: 0.8 ug/kg/h via INTRAVENOUS
  Administered 2022-05-15: 0.9 ug/kg/h via INTRAVENOUS
  Administered 2022-05-16: 0.5 ug/kg/h via INTRAVENOUS
  Administered 2022-05-16: 0.6 ug/kg/h via INTRAVENOUS
  Administered 2022-05-16: 1 ug/kg/h via INTRAVENOUS
  Administered 2022-05-16 (×2): 0.9 ug/kg/h via INTRAVENOUS
  Administered 2022-05-17: 1 ug/kg/h via INTRAVENOUS
  Filled 2022-05-11 (×5): qty 100
  Filled 2022-05-11: qty 200
  Filled 2022-05-11 (×20): qty 100

## 2022-05-11 MED ORDER — SODIUM CHLORIDE 0.9 % IV SOLN
2.0000 g | Freq: Three times a day (TID) | INTRAVENOUS | Status: DC
  Administered 2022-05-11 – 2022-05-12 (×3): 2 g via INTRAVENOUS
  Filled 2022-05-11 (×3): qty 12.5

## 2022-05-11 MED ORDER — VANCOMYCIN HCL IN DEXTROSE 1-5 GM/200ML-% IV SOLN: 1000.0000 mg | Freq: Once | INTRAVENOUS | Status: DC

## 2022-05-11 MED ORDER — IPRATROPIUM-ALBUTEROL 0.5-2.5 (3) MG/3ML IN SOLN
3.0000 mL | Freq: Once | RESPIRATORY_TRACT | Status: AC
  Administered 2022-05-11: 3 mL via RESPIRATORY_TRACT
  Filled 2022-05-11: qty 3

## 2022-05-11 MED ORDER — MIDAZOLAM HCL 2 MG/2ML IJ SOLN: 1.0000 mg | INTRAMUSCULAR | Status: DC | PRN

## 2022-05-11 MED ORDER — ONDANSETRON HCL 4 MG PO TABS: 4.0000 mg | ORAL_TABLET | Freq: Four times a day (QID) | ORAL | Status: DC | PRN

## 2022-05-11 MED ORDER — SODIUM CHLORIDE 0.9 % IV SOLN
750.0000 mg | Freq: Two times a day (BID) | INTRAVENOUS | Status: DC
  Administered 2022-05-11: 750 mg via INTRAVENOUS
  Filled 2022-05-11 (×3): qty 7.5

## 2022-05-11 MED ORDER — ACETAMINOPHEN 650 MG RE SUPP: 650.0000 mg | Freq: Four times a day (QID) | RECTAL | Status: DC | PRN

## 2022-05-11 MED ORDER — SODIUM CHLORIDE 0.9 % IV SOLN
2.0000 g | Freq: Once | INTRAVENOUS | Status: AC
  Administered 2022-05-11: 2 g via INTRAVENOUS
  Filled 2022-05-11: qty 12.5

## 2022-05-11 MED ORDER — VANCOMYCIN HCL 1250 MG/250ML IV SOLN
1250.0000 mg | INTRAVENOUS | Status: DC
  Filled 2022-05-11: qty 250

## 2022-05-11 MED ORDER — SODIUM CHLORIDE 0.9% FLUSH
3.0000 mL | Freq: Two times a day (BID) | INTRAVENOUS | Status: DC
  Administered 2022-05-11 – 2022-05-19 (×16): 3 mL via INTRAVENOUS

## 2022-05-11 MED ORDER — INSULIN ASPART 100 UNIT/ML IJ SOLN
0.0000 [IU] | Freq: Four times a day (QID) | INTRAMUSCULAR | Status: DC
  Administered 2022-05-11: 1 [IU] via SUBCUTANEOUS

## 2022-05-11 MED ORDER — ENOXAPARIN SODIUM 60 MG/0.6ML IJ SOSY
60.0000 mg | PREFILLED_SYRINGE | INTRAMUSCULAR | Status: DC
  Administered 2022-05-11 – 2022-05-17 (×7): 60 mg via SUBCUTANEOUS
  Filled 2022-05-11 (×8): qty 0.6

## 2022-05-11 MED ORDER — SODIUM CHLORIDE 0.9 % IV SOLN
750.0000 mg | Freq: Once | INTRAVENOUS | Status: AC
  Administered 2022-05-11: 750 mg via INTRAVENOUS
  Filled 2022-05-11: qty 7.5

## 2022-05-11 MED ORDER — ACETAMINOPHEN 325 MG PO TABS
650.0000 mg | ORAL_TABLET | Freq: Four times a day (QID) | ORAL | Status: DC | PRN
  Filled 2022-05-11: qty 2

## 2022-05-11 MED ORDER — IPRATROPIUM-ALBUTEROL 0.5-2.5 (3) MG/3ML IN SOLN
3.0000 mL | Freq: Four times a day (QID) | RESPIRATORY_TRACT | Status: DC
  Administered 2022-05-11 – 2022-05-19 (×34): 3 mL via RESPIRATORY_TRACT
  Filled 2022-05-11 (×34): qty 3

## 2022-05-11 MED ORDER — INSULIN ASPART 100 UNIT/ML IJ SOLN
0.0000 [IU] | INTRAMUSCULAR | Status: DC
  Administered 2022-05-11 – 2022-05-12 (×3): 3 [IU] via SUBCUTANEOUS
  Administered 2022-05-12: 8 [IU] via SUBCUTANEOUS
  Administered 2022-05-12 (×3): 3 [IU] via SUBCUTANEOUS
  Administered 2022-05-12: 5 [IU] via SUBCUTANEOUS

## 2022-05-11 MED ORDER — LORAZEPAM 2 MG/ML IJ SOLN: 2.0000 mg | INTRAMUSCULAR | Status: DC | PRN

## 2022-05-11 MED ORDER — SODIUM CHLORIDE 0.9 % IV SOLN: INTRAVENOUS | Status: DC

## 2022-05-11 MED ORDER — IOHEXOL 350 MG/ML SOLN
75.0000 mL | Freq: Once | INTRAVENOUS | Status: AC | PRN
  Administered 2022-05-11: 75 mL via INTRAVENOUS

## 2022-05-11 MED ORDER — METRONIDAZOLE 500 MG/100ML IV SOLN
500.0000 mg | Freq: Once | INTRAVENOUS | Status: AC
  Administered 2022-05-11: 500 mg via INTRAVENOUS
  Filled 2022-05-11: qty 100

## 2022-05-11 MED ORDER — ACETAMINOPHEN 650 MG RE SUPP
650.0000 mg | Freq: Once | RECTAL | Status: AC
  Administered 2022-05-11: 650 mg via RECTAL
  Filled 2022-05-11: qty 1

## 2022-05-11 MED ORDER — METHYLPREDNISOLONE SODIUM SUCC 125 MG IJ SOLR
125.0000 mg | Freq: Once | INTRAMUSCULAR | Status: AC
  Administered 2022-05-11: 125 mg via INTRAVENOUS
  Filled 2022-05-11: qty 2

## 2022-05-11 MED ORDER — TRIMETHOBENZAMIDE HCL 100 MG/ML IM SOLN: 200.0000 mg | Freq: Four times a day (QID) | INTRAMUSCULAR | Status: DC | PRN

## 2022-05-11 NOTE — Progress Notes (Signed)
Pharmacy Antibiotic Note  Grace Short is a 77 y.o. female admitted on 05/11/2022 with sepsis.  Pharmacy has been consulted for cefepime and vancomycin dosing.  Plan: Cefepime 2g Q8H.  Give IV Vancomycin '2250mg'$  x 1 for loading dose, followed by IV Vancomycin 1250 every 24 hours for [id number].  Follow culture data for de-escalation.  Monitor renal function for dose adjustments as indicated.    Height: '5\' 2"'$  (157.5 cm) Weight: 130.9 kg (288 lb 9.6 oz) IBW/kg (Calculated) : 50.1  Temp (24hrs), Avg:101.6 F (38.7 C), Min:101.6 F (38.7 C), Max:101.6 F (38.7 C)  Recent Labs  Lab 05/11/22 0726 05/11/22 0733  WBC 7.2  --   CREATININE 0.89 0.90  LATICACIDVEN 1.0  --     Estimated Creatinine Clearance: 68.1 mL/min (by C-G formula based on SCr of 0.9 mg/dL).    Allergies  Allergen Reactions   Ativan [Lorazepam]     Thank you for allowing pharmacy to be a part of this patient's care.  Esmeralda Arthur, PharmD, BCCCP  05/11/2022 8:58 AM

## 2022-05-11 NOTE — H&P (Signed)
History and Physical    Patient: Grace Short. Stocks [id number] DOB: Dec 22, 1945 DOA: 05/11/2022 DOS: the patient was seen and examined on 05/11/2022 PCP: Fanny Dance, MD  Patient coming from: [facility] via EMS  Chief Complaint:  Chief Complaint  Patient presents with   Code Sepsis   HPI: Grace Short. Dest is a 77 y.o. female with medical history significant of hypertension, hyperlipidemia, CHF, CVA, COPD on 2 L of oxygen at baseline, lymphedema who presented with altered mental status and shortness of breath.  History is limited from the patient.  She was noted by staff and to be acutely altered this morning  In route with EMS patient was noted to have O2 saturations of 84-85% on 2 L nasal cannula oxygen was placed on a nonrebreather.  In the emergency department patient was noted to be febrile up to 101.6 F with tachypnea and O2 saturations initially maintained on 4 L of nasal cannula oxygen.  ABG noted pH to be 7.214 with pCO2 79.7, pO2 79.7.  Patient was transition to BiPAP.  Labs noted WBC 7.2, CO2 35, glucose 201, BNP 33, lactic acid 1.  Chest x-ray noted low lung volumes without radiographic evidence of acute pulmonary disease and mild cardiomegaly.  CT scan of the head did not note any acute abnormality.  She had been noted to have some involuntary movements of the left arm.  Patient has been given 1 L normal saline IV fluids, Keppra IV, vancomycin, metronidazole, and cefepime.  Review of Systems: unable to review all systems due to the inability of the patient to answer questions. 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 She has never used smokeless tobacco. She reports that she does not drink alcohol and does not use drugs.  Allergies  Allergen Reactions   Ativan [Lorazepam]     Family History  Problem Relation Age of Onset   Diabetes Sister    Heart disease Sister     Prior to Admission medications   Medication Sig Start Date End Date Taking? Authorizing Provider  acetaminophen (TYLENOL) 325 MG tablet Take 650 mg by mouth 2 (two) times daily as needed.    [provider]  albuterol (VENTOLIN HFA) 108 (90 Base) MCG/ACT inhaler Inhale 2 puffs into the lungs once as needed for wheezing or shortness of breath (for Grade 3 or 4 hypersensitivity reaction). Patient not taking: Reported on 04/03/2022 12/14/19   Shelly Coss, MD  atorvastatin (LIPITOR) 20 MG tablet Take 20 mg by mouth daily. 08/22/19   [provider]  carvedilol (COREG) 12.5 MG tablet Take 1 tablet (12.5 mg total) by mouth 2 (two) times daily with a meal. Patient taking differently: Take 25 mg by mouth 2 (two) times  daily with a meal. 09/12/20   Loletha Grayer, MD  cholecalciferol (VITAMIN D3) 25 MCG (1000 UNIT) tablet Take 1,000 Units by mouth every other day.    [provider]  cholestyramine (QUESTRAN) 4 g packet Take 1 packet (4 g total) by mouth 2 (two) times daily for 7 days. 09/15/20 09/22/20  Loletha Grayer, MD  dorzolamide-timolol (COSOPT) 22.3-6.8 MG/ML ophthalmic solution Place 1 drop into both eyes 2 (two) times daily.    [provider]  furosemide (LASIX) 40 MG tablet Take 1 tablet (40 mg total) by mouth daily. 12/14/19   Shelly Coss, MD  hydrocerin (EUCERIN) CREA Apply 1 application topically daily. Patient not taking:  Reported on 04/03/2022 09/13/20   Loletha Grayer, MD  insulin glargine (LANTUS) 100 UNIT/ML injection Inject 0.13 mLs (13 Units total) into the skin 2 (two) times daily. Patient taking differently: Inject 20 Units into the skin 2 (two) times daily. 09/15/20   Loletha Grayer, MD  insulin lispro (HUMALOG) 100 UNIT/ML injection Inject 0.06 mLs (6 Units total) into the skin 3 (three) times daily with meals. Inject 10u under the skin three times daily at mealtimes (plus applicable sliding scale) - do not use if blood glucose <150 Patient taking differently: Inject 6 Units into the skin 3 (three) times daily with meals. *SLIDING SCALE* Inject 10u under the skin three times daily at mealtimes (plus applicable sliding scale) - do not use if blood glucose <150 09/12/20   Loletha Grayer, MD  levETIRAcetam (KEPPRA) 750 MG tablet Take 750 mg by mouth 2 (two) times daily. 10/31/19   [provider]  linagliptin (TRADJENTA) 5 MG TABS tablet Take 5 mg by mouth daily.    [provider]  loratadine (CLARITIN) 10 MG tablet Take 10 mg by mouth daily as needed for allergies.    [provider]  metoprolol succinate (TOPROL-XL) 25 MG 24 hr tablet Take by mouth. Patient not taking: Reported on 04/03/2022 10/15/19   [provider]  nystatin (MYCOSTATIN/NYSTOP) powder Apply 1 application topically daily as needed.    [provider]  sacubitril-valsartan (ENTRESTO) 97-103 MG Take 1 tablet by mouth 2 (two) times daily. 09/12/20   Loletha Grayer, MD  spironolactone (ALDACTONE) 25 MG tablet Take 0.5 tablets (12.5 mg total) by mouth daily. 09/16/20   Loletha Grayer, MD  vitamin B-12 (CYANOCOBALAMIN) 100 MCG tablet Take 100 mcg by mouth every other day.    [provider]  vitamin C (ASCORBIC ACID) 250 MG tablet Take 250 mg by mouth every other day.    [provider]    Physical Exam: Vitals:   05/11/22 0815 05/11/22 0900 05/11/22 0913 05/11/22 0930  BP: (!)  143/50 (!) 125/51  (!) 128/45  Pulse:  66  63  Resp: 20 (!) 22  (!) 21  Temp:   98 F (36.7 C)   TempSrc:   Axillary   SpO2:  98%  96%  Weight:      Height:       Exam  Constitutional: Morbidly obese female who is lethargic and unable to follow commands at this time Eyes: PERRL, lids and conjunctivae normal ENMT: Mucous membranes are moist.   Neck: normal, supple  Respiratory: Decreased overall aeration with Cardiovascular: Regular rate and rhythm, no murmurs / rubs / gallops.  Lymphedema noted to bilateral lower extremities. Abdomen: no tenderness, no masses palpated.  Bowel sounds positive.  Musculoskeletal: no clubbing / cyanosis.  Normal muscle tone.  Skin: Venous stasis changes noted of  the bilateral lower extremities. Neurologic: Unable to assess due to patient condition at this time. Psychiatric: Lethargic, unable to assess.  Data Reviewed:  Patient noted to be in sinus rhythm 81 bpm with QTc 539.  Assessment and Plan: Acute on chronic respiratory failure with hypoxia and hypercapnia COPD with acute exacerbation Sepsis, unknown organism Patient was noted to be febrile up to 101.29F rectally with tachypnea meeting SIRS criteria.  Patient was noted to be initially hypoxic with O2 saturations of 84% on home 2 L nasal cannula oxygen.  Initial ABG noted pH 7.214 with pCO2 79.7, and pO2 116.  Patient had been placed on BiPAP. Chest x-ray did not note any acute abnormality.  Influenza, COVID-19, and RSV screening were negative.  She does not appear to be fluid overloaded and BNP 33.  Initially there was concern for the possibility of pneumonia she had been started on [place] of vancomycin, metronidazole, and cefepime. -Admit to a progressive bed -Continuous pulse oximetry with scant oxygen maintain O2 saturation greater than 92%. -Follow-up urinalysis -Check procalcitonin  -Check repeat ABG -Check CT angiogram of the chest -Solu-Medrol IV -Continue empiric antibiotics  of vancomycin and cefepime.  De-escalate when medically appropriate. -Continue breathing treatments  Acute metabolic encephalopathy Patient was noted to be acutely altered at the facility. Thought likely secondary to hypercapnia, infection, and/or seizure.  -Neurochecks -Check EEG  Seizure disorder Patient has a family history of seizure disorder.  In the ED he had been noted to have some shaking of the upper extremities concern for seizure-like activity.  Keppra was switched to IV. -Seizure precaution -Follow-up EEG -Continue Keppra IV  Controlled diabetes mellitus type 2, with long-term use of insulin On admission patient's initial glucose in the 201.  Last available hemoglobin A1c was 6.8 previously. -Hypoglycemic protocols -Add on hemoglobin A1c -CBGs before every meal with sensitive SSI -Adjust regimen as needed  Chronic diastolic CHF BNP 33.  Patient does not appear grossly fluid overloaded at this time. -Strict INO's and daily weights  Sick sinus syndrome s/p permanent pacemaker  Lymphedema Chronic.  Morbid obesity BMI of 52.79 kg/m.  Will need to resume patient's oral medications once medically able to tolerate p.o.  DVT prophylaxis: Lovenox Advance Care Planning:   Code Status: Full Code    Consults: PCCM  Family Communication: none   Severity of Illness: The appropriate patient status for this patient is INPATIENT. Inpatient status is judged to be reasonable and necessary in order to provide the required intensity of service to ensure the patient's safety. The patient's presenting symptoms, physical exam findings, and initial radiographic and laboratory data in the context of their chronic comorbidities is felt to place them at high risk for further clinical deterioration. Furthermore, it is not anticipated that the patient will be medically stable for discharge from the hospital within 2 midnights of admission.   * I certify that at the point of admission it  is my clinical judgment that the patient will require inpatient hospital care spanning beyond 2 midnights from the point of admission due to high intensity of service, high risk for further deterioration and high frequency of surveillance required.*  Author: Norval Morton, MD 05/11/2022 9:45 AM  For on call review [email or web address].

## 2022-05-11 NOTE — ED Provider Notes (Signed)
Patient arrived from [place] facility for shortness of breath and altered mental status.  This was noticed this morning.  Her last known well was last night.  Patient is reportedly conversant at baseline.  She is reportedly on 2 L of supplemental oxygen at baseline.  When EMS arrived on scene, patient had SpO2 of 85% on 2 L.  She was placed on nonrebreather.  EMS noted that she was warm to touch.  She was tachypneic in the range of 30 breaths/min.  EMS noted concern of partial focal seizure with what appeared to be left arm involuntary movements.  On arrival in the ED, patient is awake and able to follow commands.  She is moaning and not able to verbally answer questions.  Rhonchi are present on lung auscultation.  Patient has a wet, weak cough.  She does seem to have decreased strength in the left upper extremity.  No seizure activity is noted at this time.  Bilateral lower extremities are notable for lymphedema without evidence of cellulitis or wounds.  Rectal temperature was 101.6 degrees.  Patient meets sepsis criteria.  Suspected source is pulmonary.  Broad-spectrum antibiotics and IV fluids were ordered.  Tylenol suppository was ordered.  Septic workup was initiated.  Care of patient to be assumed by oncoming ED team.   Godfrey Pick, MD 05/11/22 [phone]

## 2022-05-11 NOTE — ED Notes (Signed)
Pt has been stuck multiple times by 3 nurses for labs by straight stick and IV access w/o much blood return. Primary RN to call lab for assistance to obtain labs and [facility]

## 2022-05-11 NOTE — ED Notes (Signed)
Noted small BM (yellow in color, liquid consistency) peri care performed, linens changed, and clean brief applied. Pt reposition in bed for comfort.

## 2022-05-11 NOTE — ED Notes (Signed)
RT at bedside.

## 2022-05-11 NOTE — ED Notes (Signed)
IV team at bedside 

## 2022-05-11 NOTE — Progress Notes (Signed)
EEG complete - results pending 

## 2022-05-11 NOTE — ED Provider Notes (Signed)
[facility] Provider Note   CSN: [id number] Arrival date & time: 05/11/22  [id number]     History  Chief Complaint  Patient presents with   Code Sepsis    Grace Short is a 77 y.o. female. 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  Level 5 Caveat: AMS   Per EMS, from [facility] as code sepsis. Found this morning altered, short of breath, febrile. She is in a SNF for her chronic lymphedema and being treated for bilateral lower extremity wounds. Per the staff, last known normal was last night. She is normally conversive at baseline. On EMS arrival, patient was 84% on [facility]. She was placed on NRB.   HPI     Home Medications Prior to Admission medications   Medication Sig Start Date End Date Taking? Authorizing Provider  acetaminophen (TYLENOL) 325 MG tablet Take 650 mg by mouth 2 (two) times daily as needed.    [provider]  albuterol (VENTOLIN HFA) 108 (90 Base) MCG/ACT inhaler Inhale 2 puffs into the lungs once as needed for wheezing or shortness of breath (for Grade 3 or 4 hypersensitivity reaction). Patient not taking: Reported on 04/03/2022 12/14/19   Shelly Coss, MD  atorvastatin (LIPITOR) 20 MG tablet Take 20 mg by mouth daily. 08/22/19   [provider]  carvedilol (COREG) 12.5 MG tablet Take 1 tablet (12.5 mg total) by mouth 2 (two) times daily with a meal. Patient taking differently: Take 25 mg by mouth 2 (two) times daily with a meal. 09/12/20   Leslye Peer, Richard, MD  cholecalciferol (VITAMIN D3) 25 MCG (1000 UNIT) tablet Take 1,000 Units by mouth every other day.    [provider]  cholestyramine (QUESTRAN) 4 g packet Take 1 packet (4 g total) by mouth 2 (two) times daily for 7 days. 09/15/20 09/22/20  Loletha Grayer, MD   dorzolamide-timolol (COSOPT) 22.3-6.8 MG/ML ophthalmic solution Place 1 drop into both eyes 2 (two) times daily.    [provider]  furosemide (LASIX) 40 MG tablet Take 1 tablet (40 mg total) by mouth daily. 12/14/19   Shelly Coss, MD  hydrocerin (EUCERIN) CREA Apply 1 application topically daily. Patient not taking: Reported on 04/03/2022 09/13/20   Loletha Grayer, MD  insulin glargine (LANTUS) 100 UNIT/ML injection Inject 0.13 mLs (13 Units total) into the skin 2 (two) times daily. Patient taking differently: Inject 20 Units into the skin 2 (two) times daily. 09/15/20   Loletha Grayer, MD  insulin lispro (HUMALOG) 100 UNIT/ML injection Inject 0.06 mLs (6 Units total) into the skin 3 (three) times daily with meals. Inject 10u under the skin three times daily at mealtimes (plus applicable sliding scale) - do not use if blood glucose <150 Patient taking differently: Inject 6 Units into the skin 3 (three) times daily with meals. *SLIDING SCALE* Inject 10u under the skin three times daily at mealtimes (plus applicable sliding scale) - do not use if blood glucose <150 09/12/20   Loletha Grayer, MD  levETIRAcetam (KEPPRA) 750 MG tablet Take 750 mg by mouth 2 (two) times daily. 10/31/19   [provider]  linagliptin (TRADJENTA) 5 MG TABS tablet Take 5 mg by mouth daily.    [provider]  loratadine (CLARITIN) 10 MG tablet Take 10 mg by mouth daily as needed for allergies.    [provider]  metoprolol succinate (TOPROL-XL) 25 MG 24 hr tablet Take by mouth. Patient not taking: Reported on 04/03/2022 10/15/19   [provider]  nystatin (MYCOSTATIN/NYSTOP) powder Apply 1 application topically daily as needed.    [provider]  sacubitril-valsartan (ENTRESTO) 97-103 MG Take 1 tablet by mouth 2 (two) times daily. 09/12/20   Loletha Grayer, MD  spironolactone (ALDACTONE) 25 MG tablet Take 0.5 tablets (12.5 mg total) by mouth daily. 09/16/20    Loletha Grayer, MD  vitamin B-12 (CYANOCOBALAMIN) 100 MCG tablet Take 100 mcg by mouth every other day.    [provider]  vitamin C (ASCORBIC ACID) 250 MG tablet Take 250 mg by mouth every other day.    [provider]      Allergies    Ativan [lorazepam]    Review of Systems   Review of Systems  Unable to perform ROS: Mental status change    Physical Exam Updated Vital Signs BP (!) 118/47   Pulse 64   Temp 98 F (36.7 C) (Axillary)   Resp 14   Ht '5\' 2"'$  (1.575 m)   Wt 130.9 kg   SpO2 95%   BMI 52.79 kg/m  Physical Exam Vitals and nursing note reviewed.  Constitutional:      General: She is in acute distress.     Appearance: She is obese. She is ill-appearing and toxic-appearing.  HENT:     Head: Normocephalic and atraumatic.     Mouth/Throat:     Mouth: Mucous membranes are dry.  Eyes:     General: No scleral icterus.    Pupils: Pupils are equal, round, and reactive to light.  Cardiovascular:     Rate and Rhythm: Regular rhythm. Tachycardia present.     Pulses: Normal pulses.     Heart sounds: No murmur heard.    Comments: Chronic wounds to BLE Pulmonary:     Effort: Tachypnea and respiratory distress present.     Breath sounds: Wheezing and rhonchi present.  Abdominal:     General: Abdomen is protuberant. There is no distension.     Tenderness: There is no abdominal tenderness.  Musculoskeletal:     Right lower leg: 2+ Pitting Edema present.     Left lower leg: 2+ Pitting Edema present.  Skin:    Findings: No rash.  Neurological:     General: No focal deficit present.     Mental Status: She is alert.     GCS: GCS eye subscore is 4. GCS verbal subscore is 3. GCS motor subscore is 6.     Comments: Noted ?twitching to left arm and left arm weaker than right   Psychiatric:        Cognition and Memory: Cognition is impaired.     ED Results / Procedures / Treatments   Labs (all labs ordered are listed, but only abnormal results are  displayed) Labs Reviewed  COMPREHENSIVE METABOLIC PANEL - Abnormal; Notable for the following components:      Result Value   CO2 35 (*)    Glucose, Bld 201 (*)    Calcium 8.8 (*)    Albumin 3.2 (*)    Anion gap 4 (*)    All other components within normal limits  CBC WITH DIFFERENTIAL/PLATELET - Abnormal; Notable for the following components:   MCHC 29.3 (*)    Abs Immature Granulocytes 0.08 (*)    All other components within normal limits  I-STAT CHEM 8, ED - Abnormal; Notable for the  following components:   Glucose, Bld 203 (*)    TCO2 33 (*)    All other components within normal limits  I-STAT VENOUS BLOOD GAS, ED - Abnormal; Notable for the following components:   pCO2, Ven 69.9 (*)    Bicarbonate 33.0 (*)    TCO2 35 (*)    Acid-Base Excess 4.0 (*)    All other components within normal limits  I-STAT ARTERIAL BLOOD GAS, ED - Abnormal; Notable for the following components:   pH, Arterial 7.214 (*)    pCO2 arterial 79.7 (*)    pO2, Arterial 116 (*)    Bicarbonate 31.5 (*)    TCO2 34 (*)    All other components within normal limits  RESP PANEL BY RT-PCR (RSV, FLU A&B, COVID)  RVPGX2  CULTURE, BLOOD (ROUTINE X 2)  CULTURE, BLOOD (ROUTINE X 2)  LACTIC ACID, PLASMA  PROTIME-INR  BRAIN NATRIURETIC PEPTIDE  MAGNESIUM  AMMONIA  LACTIC ACID, PLASMA  URINALYSIS, W/ REFLEX TO CULTURE (INFECTION SUSPECTED)  BLOOD GAS, ARTERIAL  TROPONIN I (HIGH SENSITIVITY)  TROPONIN I (HIGH SENSITIVITY)    EKG EKG Interpretation  Date/Time:  Wednesday May 11 2022 06:38:11 EST Ventricular Rate:  81 PR Interval:  181 QRS Duration: 178 QT Interval:  464 QTC Calculation: 539 R Axis:   268 Text Interpretation: Sinus rhythm Nonspecific IVCD with LAD Confirmed by Godfrey Pick ([id number]) on 05/11/2022 6:50:04 AM  Radiology CT CHEST ABDOMEN PELVIS WO CONTRAST  Result Date: 05/11/2022 CLINICAL DATA:  77 year old female with history of sepsis. EXAM: CT CHEST, ABDOMEN AND PELVIS WITHOUT CONTRAST  TECHNIQUE: Multidetector CT imaging of the chest, abdomen and pelvis was performed following the standard protocol without IV contrast. RADIATION DOSE REDUCTION: This exam was performed according to the departmental dose-optimization program which includes automated exposure control, adjustment of the mA and/or kV according to patient size and/or use of iterative reconstruction technique. COMPARISON:  Chest CTA 12/07/2019. CT of the abdomen and pelvis 12/10/2019. FINDINGS: CT CHEST FINDINGS Cardiovascular: Heart size is normal. There is no significant pericardial fluid, thickening or pericardial calcification. Atherosclerotic calcifications in the thoracic aorta as well as the left anterior descending, left circumflex and right coronary arteries. Left-sided pacemaker device in place with lead tips terminating in the right atrium and right ventricle. Mediastinum/Nodes: Mildly enlarged right paratracheal lymph node measuring 1.6 cm in short axis (axial image 16 of series 3). Borderline enlarged low right paratracheal lymph node measuring 1.3 cm in short axis. Esophagus is unremarkable in appearance. No axillary lymphadenopathy. Lungs/Pleura: Patchy peribronchovascular airspace consolidation and ground-glass attenuation in the right upper lobe, compatible with bronchopneumonia. Lungs otherwise appear relatively clear (although today's study is limited by considerable patient respiratory motion). No pleural effusions. No definite suspicious appearing pulmonary nodules or masses are noted. Musculoskeletal: There are no aggressive appearing lytic or blastic lesions noted in the visualized portions of the skeleton. CT ABDOMEN PELVIS FINDINGS Hepatobiliary: No definite suspicious cystic or solid hepatic lesions are confidently identified on today's noncontrast CT examination. Numerous tiny calcified gallstones lie dependently in the gallbladder. Gallbladder is not distended. No pericholecystic fluid or surrounding  inflammatory changes. Pancreas: No definite pancreatic mass or peripancreatic fluid collections or inflammatory changes are noted on today's noncontrast CT examination. Spleen: Unremarkable. Adrenals/Urinary Tract: 3.2 cm low-attenuation lesion in the posterior aspect of the interpolar region of the right kidney, incompletely characterized on today's noncontrast CT examination, but similar to the prior study and statistically likely a cyst (no imaging follow-up recommended). Unenhanced appearance of the  left kidney and bilateral adrenal glands is otherwise unremarkable. No hydroureteronephrosis. Urinary bladder is unremarkable in appearance. Stomach/Bowel: Unenhanced appearance of the stomach is normal. No pathologic dilatation of small bowel. There is focal dilatation of the proximal sigmoid colon which measures up to 11.6 cm in diameter. More proximal aspects of the colon are otherwise unremarkable in appearance. Postoperative changes of partial small bowel resection are noted. The appendix is not confidently identified and may be surgically absent. Regardless, there are no inflammatory changes noted adjacent to the cecum to suggest the presence of an acute appendicitis at this time. Vascular/Lymphatic: Atherosclerotic calcifications in the abdominal aorta and pelvic vasculature. No definite lymphadenopathy noted in the abdomen or pelvis. Reproductive: Numerous partially calcified lesions are noted in the uterus, largest of which extends off the anterior aspect of the uterine fundus measuring up to 2.6 cm in diameter, presumably multifocal fibroids. Ovaries are unremarkable in appearance. Other: No significant volume of ascites.  No pneumoperitoneum. Musculoskeletal: There are no aggressive appearing lytic or blastic lesions noted in the visualized portions of the skeleton. IMPRESSION: 1. Right upper lobe bronchopneumonia. 2. Borderline enlarged and mildly enlarged right paratracheal lymph nodes, likely reactive  in the setting of pneumonia. 3. Isolated dilatation of the proximal sigmoid colon is of uncertain etiology and significance, however, given the lack of more proximal bowel dilatation, this is presumably benign, and appears relatively similar to prior study from 2021. 4. Aortic atherosclerosis, in addition to three-vessel coronary artery disease. Assessment for potential risk factor modification, dietary therapy or pharmacologic therapy may be warranted, if clinically indicated. 5. Cholelithiasis without evidence of acute cholecystitis. 6. Additional incidental findings, as above. Electronically Signed   By: Vinnie [name] M.D.   On: 05/11/2022 08:18   CT Head Wo Contrast  Result Date: 05/11/2022 CLINICAL DATA:  Mental status change, unknown cause. EXAM: CT HEAD WITHOUT CONTRAST TECHNIQUE: Contiguous axial images were obtained from the base of the skull through the vertex without intravenous contrast. RADIATION DOSE REDUCTION: This exam was performed according to the departmental dose-optimization program which includes automated exposure control, adjustment of the mA and/or kV according to patient size and/or use of iterative reconstruction technique. COMPARISON:  Head CT 12/07/2019. FINDINGS: Brain: No acute hemorrhage, mass effect or midline shift. Gray-white differentiation is preserved. No hydrocephalus. No extra-axial collection. Basilar cisterns are patent. Vascular: No hyperdense vessel or unexpected calcification. Skull: No calvarial fracture or suspicious bone lesion. Skull base is unremarkable. Sinuses/Orbits: Unremarkable. Other: None. IMPRESSION: No acute intracranial abnormality. Electronically Signed   By: Emmit Alexanders M.D.   On: 05/11/2022 08:08   DG Chest Port 1 View  Result Date: 05/11/2022 CLINICAL DATA:  77 year old female with possible sepsis. EXAM: PORTABLE CHEST 1 VIEW COMPARISON:  Chest x-ray 09/07/2020. FINDINGS: Lung volumes are low. No consolidative airspace disease. No pleural  effusions. No pneumothorax. No evidence of pulmonary edema. Heart size is mildly enlarged. Upper mediastinal contours are within normal limits. Left-sided pacemaker device in place with lead tips projecting over the expected location of the right atrium and right ventricle. IMPRESSION: 1. Low lung volumes without radiographic evidence of acute cardiopulmonary disease. 2. Mild cardiomegaly. Electronically Signed   By: Vinnie [name] M.D.   On: 05/11/2022 07:20    Procedures .Critical Care  Performed by: Mickie Hillier, PA-C Authorized by: Mickie Hillier, PA-C   Critical care provider statement:    Critical care time (minutes):  45   Critical care time was exclusive of:  Separately billable procedures and  treating other patients   Critical care was necessary to treat or prevent imminent or life-threatening deterioration of the following conditions:  Respiratory failure and sepsis   Critical care was time spent personally by me on the following activities:  Development of treatment plan with patient or surrogate, discussions with consultants, discussions with primary provider, evaluation of patient's response to treatment, examination of patient, interpretation of cardiac output measurements, obtaining history from patient or surrogate, ventilator management, review of old charts, pulse oximetry, re-evaluation of patient's condition, ordering and review of radiographic studies, ordering and review of laboratory studies and ordering and performing treatments and interventions   I assumed direction of critical care for this patient from another provider in my specialty: no     Care discussed with: admitting provider      Medications Ordered in ED Medications  0.9 %  sodium chloride infusion ( Intravenous New Bag/Given 05/11/22 0900)  vancomycin (VANCOCIN) 2,250 mg in sodium chloride 0.9 % 500 mL IVPB (2,250 mg Intravenous New Bag/Given 05/11/22 0951)  ceFEPIme (MAXIPIME) 2 g in sodium chloride 0.9 %  100 mL IVPB (has no administration in time range)  vancomycin (VANCOREADY) IVPB 1250 mg/250 mL (has no administration in time range)  sodium chloride 0.9 % bolus 1,000 mL (0 mLs Intravenous Stopped 05/11/22 0900)  ceFEPIme (MAXIPIME) 2 g in sodium chloride 0.9 % 100 mL IVPB (0 g Intravenous Stopped 05/11/22 0815)  metroNIDAZOLE (FLAGYL) IVPB 500 mg (0 mg Intravenous Stopped 05/11/22 0949)  acetaminophen (TYLENOL) suppository 650 mg (650 mg Rectal Given 05/11/22 0706)  ipratropium-albuterol (DUONEB) 0.5-2.5 (3) MG/3ML nebulizer solution 3 mL (3 mLs Nebulization Given 05/11/22 0731)  levETIRAcetam (KEPPRA) 750 mg in sodium chloride 0.9 % 100 mL IVPB (0 mg Intravenous Stopped 05/11/22 0757)    ED Course/ Medical Decision Making/ A&P Clinical Course as of 05/11/22 1003  Wed May 11, 2022  0746 Mental status mildly improving. Able to tell me her name and year. She moves all extremities to command. Will place her on BiPAP given Co2 retention to see if her mental status improves [LA]  0748 pCO2, Ven(!): 69.9 [JL]  0832 On BiPAP. Continues to wake to verbal stimulus.  [LA]  0954 pH, Arterial(!): 7.214 [JL]  0954 pCO2 arterial(!!): 79.7 [JL]  0954 Pt ripped off BiPAP mask. Blood gas with worsening acidosis. Evaluated bedside. Has a friend now bedside redirecting her. She is still following commands.  [JL]  [id number] If pt rips off BiPAP again may need to intubate  [JL]    Clinical Course User Index [JL] Regan Lemming, MD [LA] Mickie Hillier, PA-C    Medical Decision Making Amount and/or Complexity of Data Reviewed Labs: ordered. Decision-making details documented in ED Course. Radiology: ordered. ECG/medicine tests: ordered.  Risk OTC drugs. Prescription drug management. Decision regarding hospitalization.  Initial Impression and Ddx 77 year old female who presents to the emergency department with altered mental status and shortness of breath. Patient PMH that increases complexity of ED encounter:  Diastolic heart failure, venous insufficiency, sick sinus syndrome, hypertension, COPD, TIA, type 2 diabetes Differential: Encephalopathy, sepsis, shock, pneumonia, pneumothorax, PE, intracranial abnormality, electrolyte dysfunction, etc.  Interpretation of Diagnostics I independent reviewed and interpreted the labs as followed: CMP with no significant electrolyte dysfunction, CBC without leukocytosis, coags normal, lactic negative, ammonia negative, troponin and BNP within normal limits.  Initial venous gas with pCO2 of 69.9, repeat pCO2 79.7  - I independently visualized the following imaging with scope of interpretation limited to determining acute life threatening  conditions related to emergency care: CT head and chest abdomen pelvis, which revealed bronchopneumonia  Patient Reassessment and Ultimate Disposition/Management 77 year old female who presents with altered mental status and shortness of breath. Septic presentation on arrival with fever to 101.6, mildly hypotensive, tachypneic, hypoxic. Sepsis protocol ordered with broad-spectrum antibiotics, likely pneumonia but will rule out other sources.  Also obtain a venous gas given her history of COPD and altered mental status.  We also obtain a CT head and dry scan of the chest abdomen pelvis.  Labs with notable finding of pCO2 of 69.9, she was placed on BiPAP.  We repeated gas which showed her pCO2 is 79.7, however the patient had taken off her BiPAP so unclear if this is an accurate repeated lab. CT showed that she does have a bronchopneumonia.  Her respiratory panel is pending.  She is on antibiotics. Lactic, BNP, troponin, ammonia are all within normal limits. Blood cultures are pending  Mental status mildly improving throughout her ED stay.  Now telling me her name, year and following commands spontaneously.  She does intermittently become somnolent.  She presented in the previous provider noted possible right arm twitching and weakness.   She was loaded with Keppra.  She has a history of seizures.  There is no seizure-like activity visualized since then.  She had a CT head which was negative.  Consulted and spoke with Dr. Tamala Julian, hospitalist who agrees to admit the patient.  Patient management required discussion with the following services or consulting groups:  Hospitalist Service  Complexity of Problems Addressed Acute illness or injury that poses threat of life of bodily function  Additional Data Reviewed and Analyzed Further history obtained from: EMS on arrival, Past medical history and medications listed in the EMR, Prior ED visit notes, Care Everywhere, and Prior labs/imaging results  Patient Encounter Risk Assessment Consideration of hospitalization  Final Clinical Impression(s) / ED Diagnoses Final diagnoses:  Altered mental status, unspecified altered mental status type  Sepsis with encephalopathy without septic shock, due to unspecified organism [facility])    Rx / DC Orders ED Discharge Orders     None         Mickie Hillier, PA-C 05/11/22 1010    Regan Lemming, MD 05/11/22 1026

## 2022-05-11 NOTE — ED Notes (Signed)
3 RNS and NT cleaned pt, pt had large bowel movement

## 2022-05-11 NOTE — Progress Notes (Signed)
RT walked past patients room.  Patient satting 83% and BiPAP mask off with machine alarming.  RT entered patients room placed mask back on and increased oxygen to 100%.  Patient now satting 100.  Will titrate back down.  RT will continue to monitor.

## 2022-05-11 NOTE — Procedures (Signed)
Patient Name: Grace Short. [name]  MRN: [id number]  Epilepsy Attending: Lora Havens  Referring Physician/Provider: Norval Morton, MD  Date: 05/11/2022  Duration: 21.30 mins  Patient history: 77yo F with ams. EEG to evaluate for seizure  Level of alertness:  lethargic   AEDs during EEG study: LEV  Technical aspects: This EEG study was done with scalp electrodes positioned according to the 10-20 International system of electrode placement. Electrical activity was reviewed with band pass filter of 1-'70Hz'$ , sensitivity of 7 uV/mm, display speed of 42m/sec with a '60Hz'$  notched filter applied as appropriate. EEG data were recorded continuously and digitally stored.  Video monitoring was available and reviewed as appropriate.  Description: EEG showed continuous 5 to 7 Hz theta slowing in right hemisphere and 2-'5hz'$  theta-delta slowing in left hemisphere, at times with triphasic morphology. Hyperventilation and photic stimulation were not performed.     ABNORMALITY - Continuous slow, generalized and lateralized left hemisphere  IMPRESSION: This study is suggestive of cortical dysfunction arising from left hemisphere likely secondary to underlying structural abnormality. Additionally there is moderate diffuse encephalopathy, nonspecific etiology. No seizures or epileptiform discharges were seen throughout the recording.  Cohan Stipes OBarbra Sarks

## 2022-05-11 NOTE — ED Notes (Signed)
RN attempted to call report 3 times. ICU states RN can not give report until 7pm. AC notified.

## 2022-05-11 NOTE — ED Notes (Signed)
ED TO INPATIENT HANDOFF REPORT  ED Nurse Name and Phone #: Tori [phone]  S Name/Age/Gender Grace Short. Jeng 77 y.o. female Room/Bed: RESUSC/RESUSC  Code Status   Code Status: Full Code  Home/SNF/Other Home [place]- N/A- non comprehensible speech  Is this baseline? No   Triage Complete: Triage complete  Chief Complaint Acute respiratory failure with hypoxia and hypercapnia (HCC) [J96.01, J96.02]  Triage Note Pt from [facility] BIB [facility] for CODE SEPSIS, SNF reports AMS, SOB, feverish to touch, pt being treated for bilateral wounds to LE. Pt unable to respond with words, pt only moans, this is new for pt. LKW is unknown. Possible LNW was last night for evening rounds at SNF, staff found pt alter and Sob at 0600 am, PTA . Pt arrived with NRB, sats 84% on 2L [place] upon EMS arrival. BP 141/59, RR 30s.    Allergies Allergies  Allergen Reactions   Ativan [Lorazepam] Other (See Comments)    "Stopped breathing"    Level of Care/Admitting Diagnosis ED Disposition     ED Disposition  Admit   Condition  --   [place]: [place] [[id number]]  Level of Care: Progressive [102]  Admit to Progressive based on following criteria: RESPIRATORY PROBLEMS hypoxemic/hypercapnic respiratory failure that is responsive to NIPPV (BiPAP) or High Flow Nasal Cannula (6-80 lpm). Frequent assessment/intervention, no > Q2 hrs < Q4 hrs, to maintain oxygenation and pulmonary hygiene.  Admit to Progressive based on following criteria: COMPLICATED UROLOGY Patients requiring frequent assessments and interventions, such as continuous bladder irrigations, immediate post-op surgical procedures, i.e., bladder removal/ileal conduit.  May admit patient to [facility] or [facility] if equivalent level of care is available:: No  Covid Evaluation: Symptomatic Person Under Investigation (PUI) or recent exposure (last 10 days) *Testing Required*  Diagnosis: Acute respiratory failure with  hypoxia and hypercapnia [facility]) [id number]  Admitting Physician: Norval Morton [id number]  Attending Physician: Norval Morton [id number]  Certification:: I certify this patient will need inpatient services for at least 2 midnights  Estimated Length of Stay: 3          B Medical/Surgery History Past Medical History:  Diagnosis Date   Chronic acquired lymphedema    Chronic heart failure with preserved ejection fraction (HFpEF) (HCC)    COPD (chronic obstructive pulmonary disease) ([place])    CVA (cerebral vascular accident) ([place])    DM (diabetes mellitus) ([place])    HLD (hyperlipidemia)    Hypertension    Morbid obesity with BMI of 50.0-59.9, adult ([place])    Partial seizure disorder ([place])    SSS (sick sinus syndrome) ([place])    Past Surgical History:  Procedure Laterality Date   APPENDECTOMY     BREAST SURGERY     colectomy     rectal surgery   HERNIA REPAIR     PACEMAKER IMPLANT     TONSILLECTOMY       A IV Location/Drains/Wounds Patient Lines/Drains/Airways Status     Active Line/Drains/Airways     Name Placement date Placement time Site Days   Peripheral IV 05/11/22 22 G Anterior;Right Wrist 05/11/22  0658  Wrist  less than 1   Peripheral IV 05/11/22 18 G 1.88" Left Antecubital 05/11/22  0943  Antecubital  less than 1            Intake/Output Last 24 hours No intake or output data in the 24 hours ending 05/11/22 1550  Labs/Imaging Results for orders placed or performed during the hospital  encounter of 05/11/22 (from the past 48 hour(s))  Resp panel by RT-PCR (RSV, Flu A&B, Covid) Anterior Nasal Swab     Status: None   Collection Time: 05/11/22  6:40 AM   Specimen: Anterior Nasal Swab  Result Value Ref Range   SARS Coronavirus 2 by RT PCR NEGATIVE NEGATIVE   Influenza A by PCR NEGATIVE NEGATIVE   Influenza B by PCR NEGATIVE NEGATIVE    Comment: (NOTE) The Xpert Xpress SARS-CoV-2/FLU/RSV plus assay is intended as an aid in the diagnosis of influenza from  Nasopharyngeal swab specimens and should not be used as a sole basis for treatment. Nasal washings and aspirates are unacceptable for Xpert Xpress SARS-CoV-2/FLU/RSV testing.  Fact Sheet for Patients: EntrepreneurPulse.com.au  Fact Sheet for Healthcare Providers: IncredibleEmployment.be  This test is not yet approved or cleared by the [place] FDA and has been authorized for detection and/or diagnosis of SARS-CoV-2 by FDA under an Emergency Use Authorization (EUA). This EUA will remain in effect (meaning this test can be used) for the duration of the COVID-19 declaration under Section 564(b)(1) of the Act, 21 U.S.C. section 360bbb-3(b)(1), unless the authorization is terminated or revoked.     Resp Syncytial Virus by PCR NEGATIVE NEGATIVE    Comment: (NOTE) Fact Sheet for Patients: EntrepreneurPulse.com.au  Fact Sheet for Healthcare Providers: IncredibleEmployment.be  This test is not yet approved or cleared by the [place] FDA and has been authorized for detection and/or diagnosis of SARS-CoV-2 by FDA under an Emergency Use Authorization (EUA). This EUA will remain in effect (meaning this test can be used) for the duration of the COVID-19 declaration under Section 564(b)(1) of the Act, 21 U.S.C. section 360bbb-3(b)(1), unless the authorization is terminated or revoked.  Performed at [facility], [place] [address], [place], [place] [address]   Lactic acid, plasma     Status: None   Collection Time: 05/11/22  7:26 AM  Result Value Ref Range   Lactic Acid, Venous 1.0 0.5 - 1.9 mmol/L    Comment: Performed at [place] [address], [place], [place] [address]  Comprehensive metabolic panel     Status: Abnormal   Collection Time: 05/11/22  7:26 AM  Result Value Ref Range   Sodium 141 135 - 145 mmol/L   Potassium 3.9 3.5 - 5.1 mmol/L   Chloride 102 98 - 111 mmol/L   CO2 35 (H)  22 - 32 mmol/L   Glucose, Bld 201 (H) 70 - 99 mg/dL    Comment: Glucose reference range applies only to samples taken after fasting for at least 8 hours.   BUN 18 8 - 23 mg/dL   Creatinine, Ser 0.89 0.44 - 1.00 mg/dL   Calcium 8.8 (L) 8.9 - 10.3 mg/dL   Total Protein 7.2 6.5 - 8.1 g/dL   Albumin 3.2 (L) 3.5 - 5.0 g/dL   AST 15 15 - 41 U/L   ALT 12 0 - 44 U/L   Alkaline Phosphatase 54 38 - 126 U/L   Total Bilirubin 0.5 0.3 - 1.2 mg/dL   GFR, Estimated >60 >60 mL/min    Comment: (NOTE) Calculated using the CKD-EPI Creatinine Equation (2021)    Anion gap 4 (L) 5 - 15    Comment: Performed at [facility], [place] [address], [place], [place] [address]  CBC with Differential     Status: Abnormal   Collection Time: 05/11/22  7:26 AM  Result Value Ref Range   WBC 7.2 4.0 -  10.5 K/uL   RBC 4.83 3.87 - 5.11 MIL/uL   Hemoglobin 12.6 12.0 - 15.0 g/dL   HCT 43.0 36.0 - 46.0 %   MCV 89.0 80.0 - 100.0 fL   MCH 26.1 26.0 - 34.0 pg   MCHC 29.3 (L) 30.0 - 36.0 g/dL   RDW 15.4 11.5 - 15.5 %   Platelets 158 150 - 400 K/uL   nRBC 0.0 0.0 - 0.2 %   Neutrophils Relative % 70 %   Neutro Abs 5.0 1.7 - 7.7 K/uL   Lymphocytes Relative 19 %   Lymphs Abs 1.4 0.7 - 4.0 K/uL   Monocytes Relative 9 %   Monocytes Absolute 0.7 0.1 - 1.0 K/uL   Eosinophils Relative 1 %   Eosinophils Absolute 0.1 0.0 - 0.5 K/uL   Basophils Relative 0 %   Basophils Absolute 0.0 0.0 - 0.1 K/uL   Immature Granulocytes 1 %   Abs Immature Granulocytes 0.08 (H) 0.00 - 0.07 K/uL    Comment: Performed at [facility] [address] [place], [place] [address]  Protime-INR     Status: None   Collection Time: 05/11/22  7:26 AM  Result Value Ref Range   Prothrombin Time 13.6 11.4 - 15.2 seconds   INR 1.1 0.8 - 1.2    Comment: (NOTE) INR goal varies based on device and disease states. Performed at [facility], [place] [address] [place], [place] [address]   Brain natriuretic peptide     Status: None    Collection Time: 05/11/22  7:26 AM  Result Value Ref Range   B Natriuretic Peptide 33.0 0.0 - 100.0 pg/mL    Comment: Performed at [facility] [address], [place], [place] [address]  Troponin I (High Sensitivity)     Status: None   Collection Time: 05/11/22  7:26 AM  Result Value Ref Range   Troponin I (High Sensitivity) 17 <18 ng/L    Comment: (NOTE) Elevated high sensitivity troponin I (hsTnI) values and significant  changes across serial measurements may suggest ACS but many other  chronic and acute conditions are known to elevate hsTnI results.  Refer to the "Links" section for chest pain algorithms and additional  guidance. Performed at [facility], [place] [address] [place], [place] [address]   Magnesium     Status: None   Collection Time: 05/11/22  7:26 AM  Result Value Ref Range   Magnesium 1.9 1.7 - 2.4 mg/dL    Comment: Performed at [facility] [address], [place], [place] [address]  Ammonia     Status: None   Collection Time: 05/11/22  7:26 AM  Result Value Ref Range   Ammonia 24 9 - 35 umol/L    Comment: Performed at [facility], [place] [address] [place], [place] [address]  Procalcitonin     Status: None   Collection Time: 05/11/22  7:26 AM  Result Value Ref Range   Procalcitonin <0.10 ng/mL    Comment:        Interpretation: PCT (Procalcitonin) <= 0.5 ng/mL: Systemic infection (sepsis) is not likely. Local bacterial infection is possible. (NOTE)       Sepsis PCT Algorithm           Lower Respiratory Tract  Infection PCT Algorithm    ----------------------------     ----------------------------         PCT < 0.25 ng/mL                PCT < 0.10 ng/mL          Strongly encourage             Strongly discourage   discontinuation of antibiotics    initiation of antibiotics    ----------------------------     -----------------------------       PCT 0.25 - 0.50 ng/mL            PCT 0.10 - 0.25  ng/mL               OR       >80% decrease in PCT            Discourage initiation of                                            antibiotics      Encourage discontinuation           of antibiotics    ----------------------------     -----------------------------         PCT >= 0.50 ng/mL              PCT 0.26 - 0.50 ng/mL               AND        <80% decrease in PCT             Encourage initiation of                                             antibiotics       Encourage continuation           of antibiotics    ----------------------------     -----------------------------        PCT >= 0.50 ng/mL                  PCT > 0.50 ng/mL               AND         increase in PCT                  Strongly encourage                                      initiation of antibiotics    Strongly encourage escalation           of antibiotics                                     -----------------------------                                           PCT <= 0.25 ng/mL  OR                                        > 80% decrease in PCT                                      Discontinue / Do not initiate                                             antibiotics  Performed at [facility], [place] [address], [place], [place] [address]   I-Stat Chem 8, ED     Status: Abnormal   Collection Time: 05/11/22  7:33 AM  Result Value Ref Range   Sodium 143 135 - 145 mmol/L   Potassium 4.0 3.5 - 5.1 mmol/L   Chloride 101 98 - 111 mmol/L   BUN 20 8 - 23 mg/dL   Creatinine, Ser 0.90 0.44 - 1.00 mg/dL   Glucose, Bld 203 (H) 70 - 99 mg/dL    Comment: Glucose reference range applies only to samples taken after fasting for at least 8 hours.   Calcium, Ion 1.16 1.15 - 1.40 mmol/L   TCO2 33 (H) 22 - 32 mmol/L   Hemoglobin 14.3 12.0 - 15.0 g/dL   HCT 42.0 36.0 - 46.0 %  I-Stat venous blood gas, ([facility], [facility], [facility])     Status: Abnormal   Collection Time: 05/11/22  7:34  AM  Result Value Ref Range   pH, Ven 7.282 7.25 - 7.43   pCO2, Ven 69.9 (H) 44 - 60 mmHg   pO2, Ven 35 32 - 45 mmHg   Bicarbonate 33.0 (H) 20.0 - 28.0 mmol/L   TCO2 35 (H) 22 - 32 mmol/L   O2 Saturation 58 %   Acid-Base Excess 4.0 (H) 0.0 - 2.0 mmol/L   Sodium 143 135 - 145 mmol/L   Potassium 4.0 3.5 - 5.1 mmol/L   Calcium, Ion 1.16 1.15 - 1.40 mmol/L   HCT 42.0 36.0 - 46.0 %   Hemoglobin 14.3 12.0 - 15.0 g/dL   Sample type VENOUS    Comment NOTIFIED PHYSICIAN   I-Stat arterial blood gas, ED     Status: Abnormal   Collection Time: 05/11/22  9:17 AM  Result Value Ref Range   pH, Arterial 7.214 (L) 7.35 - 7.45   pCO2 arterial 79.7 (HH) 32 - 48 mmHg   pO2, Arterial 116 (H) 83 - 108 mmHg   Bicarbonate 31.5 (H) 20.0 - 28.0 mmol/L   TCO2 34 (H) 22 - 32 mmol/L   O2 Saturation 97 %   Acid-Base Excess 2.0 0.0 - 2.0 mmol/L   Sodium 145 135 - 145 mmol/L   Potassium 3.7 3.5 - 5.1 mmol/L   Calcium, Ion 1.26 1.15 - 1.40 mmol/L   HCT 37.0 36.0 - 46.0 %   Hemoglobin 12.6 12.0 - 15.0 g/dL   Patient temperature 101.6 F    Sample type ARTERIAL    Comment NOTIFIED PHYSICIAN   Lactic acid, plasma     Status: None   Collection Time: 05/11/22  9:45 AM  Result Value Ref Range   Lactic Acid, Venous 1.0 0.5 - 1.9 mmol/L  Comment: Performed at [facility], [place] [address], [place], [place] [address]  Troponin I (High Sensitivity)     Status: Abnormal   Collection Time: 05/11/22  9:45 AM  Result Value Ref Range   Troponin I (High Sensitivity) 21 (H) <18 ng/L    Comment: (NOTE) Elevated high sensitivity troponin I (hsTnI) values and significant  changes across serial measurements may suggest ACS but many other  chronic and acute conditions are known to elevate hsTnI results.  Refer to the "Links" section for chest pain algorithms and additional  guidance. Performed at [facility], [place] [address], [place], [place] [address]   I-Stat arterial blood gas, ED     Status: Abnormal    Collection Time: 05/11/22 11:33 AM  Result Value Ref Range   pH, Arterial 7.238 (L) 7.35 - 7.45   pCO2 arterial 71.5 (HH) 32 - 48 mmHg   pO2, Arterial 92 83 - 108 mmHg   Bicarbonate 30.7 (H) 20.0 - 28.0 mmol/L   TCO2 33 (H) 22 - 32 mmol/L   O2 Saturation 95 %   Acid-Base Excess 2.0 0.0 - 2.0 mmol/L   Sodium 145 135 - 145 mmol/L   Potassium 3.6 3.5 - 5.1 mmol/L   Calcium, Ion 1.25 1.15 - 1.40 mmol/L   HCT 34.0 (L) 36.0 - 46.0 %   Hemoglobin 11.6 (L) 12.0 - 15.0 g/dL   Patient temperature 98.0 F    Sample type ARTERIAL    Comment NOTIFIED PHYSICIAN   I-Stat arterial blood gas, ED     Status: Abnormal   Collection Time: 05/11/22  3:39 PM  Result Value Ref Range   pH, Arterial 7.217 (L) 7.35 - 7.45   pCO2 arterial 77.5 (HH) 32 - 48 mmHg   pO2, Arterial 77 (L) 83 - 108 mmHg   Bicarbonate 31.9 (H) 20.0 - 28.0 mmol/L   TCO2 34 (H) 22 - 32 mmol/L   O2 Saturation 92 %   Acid-Base Excess 2.0 0.0 - 2.0 mmol/L   Sodium 146 (H) 135 - 145 mmol/L   Potassium 4.2 3.5 - 5.1 mmol/L   Calcium, Ion 1.29 1.15 - 1.40 mmol/L   HCT 36.0 36.0 - 46.0 %   Hemoglobin 12.2 12.0 - 15.0 g/dL   Patient temperature 97.2 F    Collection site RADIAL, ALLEN'S TEST ACCEPTABLE    Drawn by RT    Sample type ARTERIAL    Comment NOTIFIED PHYSICIAN    CT CHEST ABDOMEN PELVIS WO CONTRAST  Result Date: 05/11/2022 CLINICAL DATA:  77 year old female with history of sepsis. EXAM: CT CHEST, ABDOMEN AND PELVIS WITHOUT CONTRAST TECHNIQUE: Multidetector CT imaging of the chest, abdomen and pelvis was performed following the standard protocol without IV contrast. RADIATION DOSE REDUCTION: This exam was performed according to the departmental dose-optimization program which includes automated exposure control, adjustment of the mA and/or kV according to patient size and/or use of iterative reconstruction technique. COMPARISON:  Chest CTA 12/07/2019. CT of the abdomen and pelvis 12/10/2019. FINDINGS: CT CHEST FINDINGS  Cardiovascular: Heart size is normal. There is no significant pericardial fluid, thickening or pericardial calcification. Atherosclerotic calcifications in the thoracic aorta as well as the left anterior descending, left circumflex and right coronary arteries. Left-sided pacemaker device in place with lead tips terminating in the right atrium and right ventricle. Mediastinum/Nodes: Mildly enlarged right paratracheal lymph node measuring 1.6 cm in short axis (axial image 16 of series 3). Borderline enlarged low right paratracheal lymph node measuring 1.3  cm in short axis. Esophagus is unremarkable in appearance. No axillary lymphadenopathy. Lungs/Pleura: Patchy peribronchovascular airspace consolidation and ground-glass attenuation in the right upper lobe, compatible with bronchopneumonia. Lungs otherwise appear relatively clear (although today's study is limited by considerable patient respiratory motion). No pleural effusions. No definite suspicious appearing pulmonary nodules or masses are noted. Musculoskeletal: There are no aggressive appearing lytic or blastic lesions noted in the visualized portions of the skeleton. CT ABDOMEN PELVIS FINDINGS Hepatobiliary: No definite suspicious cystic or solid hepatic lesions are confidently identified on today's noncontrast CT examination. Numerous tiny calcified gallstones lie dependently in the gallbladder. Gallbladder is not distended. No pericholecystic fluid or surrounding inflammatory changes. Pancreas: No definite pancreatic mass or peripancreatic fluid collections or inflammatory changes are noted on today's noncontrast CT examination. Spleen: Unremarkable. Adrenals/Urinary Tract: 3.2 cm low-attenuation lesion in the posterior aspect of the interpolar region of the right kidney, incompletely characterized on today's noncontrast CT examination, but similar to the prior study and statistically likely a cyst (no imaging follow-up recommended). Unenhanced appearance of  the left kidney and bilateral adrenal glands is otherwise unremarkable. No hydroureteronephrosis. Urinary bladder is unremarkable in appearance. Stomach/Bowel: Unenhanced appearance of the stomach is normal. No pathologic dilatation of small bowel. There is focal dilatation of the proximal sigmoid colon which measures up to 11.6 cm in diameter. More proximal aspects of the colon are otherwise unremarkable in appearance. Postoperative changes of partial small bowel resection are noted. The appendix is not confidently identified and may be surgically absent. Regardless, there are no inflammatory changes noted adjacent to the cecum to suggest the presence of an acute appendicitis at this time. Vascular/Lymphatic: Atherosclerotic calcifications in the abdominal aorta and pelvic vasculature. No definite lymphadenopathy noted in the abdomen or pelvis. Reproductive: Numerous partially calcified lesions are noted in the uterus, largest of which extends off the anterior aspect of the uterine fundus measuring up to 2.6 cm in diameter, presumably multifocal fibroids. Ovaries are unremarkable in appearance. Other: No significant volume of ascites.  No pneumoperitoneum. Musculoskeletal: There are no aggressive appearing lytic or blastic lesions noted in the visualized portions of the skeleton. IMPRESSION: 1. Right upper lobe bronchopneumonia. 2. Borderline enlarged and mildly enlarged right paratracheal lymph nodes, likely reactive in the setting of pneumonia. 3. Isolated dilatation of the proximal sigmoid colon is of uncertain etiology and significance, however, given the lack of more proximal bowel dilatation, this is presumably benign, and appears relatively similar to prior study from 2021. 4. Aortic atherosclerosis, in addition to three-vessel coronary artery disease. Assessment for potential risk factor modification, dietary therapy or pharmacologic therapy may be warranted, if clinically indicated. 5. Cholelithiasis  without evidence of acute cholecystitis. 6. Additional incidental findings, as above. Electronically Signed   By: Vinnie [name] M.D.   On: 05/11/2022 08:18   CT Head Wo Contrast  Result Date: 05/11/2022 CLINICAL DATA:  Mental status change, unknown cause. EXAM: CT HEAD WITHOUT CONTRAST TECHNIQUE: Contiguous axial images were obtained from the base of the skull through the vertex without intravenous contrast. RADIATION DOSE REDUCTION: This exam was performed according to the departmental dose-optimization program which includes automated exposure control, adjustment of the mA and/or kV according to patient size and/or use of iterative reconstruction technique. COMPARISON:  Head CT 12/07/2019. FINDINGS: Brain: No acute hemorrhage, mass effect or midline shift. Gray-white differentiation is preserved. No hydrocephalus. No extra-axial collection. Basilar cisterns are patent. Vascular: No hyperdense vessel or unexpected calcification. Skull: No calvarial fracture or suspicious bone lesion. Skull base is  unremarkable. Sinuses/Orbits: Unremarkable. Other: None. IMPRESSION: No acute intracranial abnormality. Electronically Signed   By: Emmit Alexanders M.D.   On: 05/11/2022 08:08   DG Chest Port 1 View  Result Date: 05/11/2022 CLINICAL DATA:  77 year old female with possible sepsis. EXAM: PORTABLE CHEST 1 VIEW COMPARISON:  Chest x-ray 09/07/2020. FINDINGS: Lung volumes are low. No consolidative airspace disease. No pleural effusions. No pneumothorax. No evidence of pulmonary edema. Heart size is mildly enlarged. Upper mediastinal contours are within normal limits. Left-sided pacemaker device in place with lead tips projecting over the expected location of the right atrium and right ventricle. IMPRESSION: 1. Low lung volumes without radiographic evidence of acute cardiopulmonary disease. 2. Mild cardiomegaly. Electronically Signed   By: Vinnie [name] M.D.   On: 05/11/2022 07:20    Pending Labs Unresulted Labs  (From admission, onward)     Start     Ordered   05/12/22 0500  CBC  Tomorrow morning,   R        05/11/22 1032   05/11/22 1507  Blood gas, arterial  Once,   R        05/11/22 1506   05/11/22 1043  Urinalysis, Routine w reflex microscopic -Urine, Clean Catch  Once,   R       Question:  Specimen Source  Answer:  Urine, Clean Catch   05/11/22 1042   05/11/22 1034  MRSA Next Gen by PCR, Nasal  (MRSA Screening)  Once,   R        05/11/22 1033   05/11/22 0852  Blood gas, arterial  Once,   R        05/11/22 0851   05/11/22 0641  Urinalysis, w/ Reflex to Culture (Infection Suspected) -Urine, Clean Catch  (Septic presentation on arrival (screening labs, nursing and treatment orders for obvious sepsis))  Once,   URGENT       Question:  Specimen Source  Answer:  Urine, Clean Catch   05/11/22 0641   05/11/22 0640  Blood Culture (routine x 2)  (Septic presentation on arrival (screening labs, nursing and treatment orders for obvious sepsis))  BLOOD CULTURE X 2,   STAT      05/11/22 0641            Vitals/Pain Today's Vitals   05/11/22 1400 05/11/22 1430 05/11/22 1435 05/11/22 1436  BP: (!) 124/46 (!) 111/39 (!) 115/46   Pulse: (!) 59 (!) 59 67   Resp: '15 15 15   '$ Temp:    (!) 97.2 F (36.2 C)  TempSrc:    Temporal  SpO2: 100% 98% 100%   Weight:      Height:      PainSc:        Isolation Precautions No active isolations  Medications Medications  ceFEPIme (MAXIPIME) 2 g in sodium chloride 0.9 % 100 mL IVPB (0 g Intravenous Stopped 05/11/22 1507)  vancomycin (VANCOREADY) IVPB 1250 mg/250 mL (has no administration in time range)  enoxaparin (LOVENOX) injection 60 mg (60 mg Subcutaneous Given 05/11/22 1041)  sodium chloride flush (NS) 0.9 % injection 3 mL (3 mLs Intravenous Not Given 05/11/22 1034)  acetaminophen (TYLENOL) tablet 650 mg (has no administration in time range)    Or  acetaminophen (TYLENOL) suppository 650 mg (has no administration in time range)  albuterol (PROVENTIL) (2.5  MG/3ML) 0.083% nebulizer solution 2.5 mg (has no administration in time range)  levETIRAcetam (KEPPRA) 750 mg in sodium chloride 0.9 % 100 mL IVPB (has no administration in time  range)  trimethobenzamide (TIGAN) injection 200 mg (has no administration in time range)  sodium chloride 0.9 % bolus 1,000 mL (0 mLs Intravenous Stopped 05/11/22 0900)  ceFEPIme (MAXIPIME) 2 g in sodium chloride 0.9 % 100 mL IVPB (0 g Intravenous Stopped 05/11/22 0815)  metroNIDAZOLE (FLAGYL) IVPB 500 mg (0 mg Intravenous Stopped 05/11/22 0949)  acetaminophen (TYLENOL) suppository 650 mg (650 mg Rectal Given 05/11/22 0706)  ipratropium-albuterol (DUONEB) 0.5-2.5 (3) MG/3ML nebulizer solution 3 mL (3 mLs Nebulization Given 05/11/22 0731)  levETIRAcetam (KEPPRA) 750 mg in sodium chloride 0.9 % 100 mL IVPB (0 mg Intravenous Stopped 05/11/22 0757)  vancomycin (VANCOCIN) 2,250 mg in sodium chloride 0.9 % 500 mL IVPB (0 mg Intravenous Stopped 05/11/22 1200)    Mobility non-ambulatory     Focused Assessments Neuro Assessment Handoff:  Swallow screen pass? No  Cardiac Rhythm: Normal sinus rhythm, Ventricular paced       Neuro Assessment:   Neuro Checks:      Has TPA been given? No If patient is a Neuro Trauma and patient is going to OR before floor call report to [place] nurse: [phone] or [phone]   R Recommendations: See Admitting Provider Note  Report given to:   Additional Notes: Pt came in grunting and will occasionally grunt. EEG is at bedside. Pt still on Bi-pap.

## 2022-05-11 NOTE — Progress Notes (Addendum)
eLink Physician-Brief Progress Note Patient Name: Grace Short. Ditton DOB: [birth year] MRN: [id number]   Date of Service  05/11/2022  HPI/Events of Note  77 y.o. F presented with AMS, fever, RUL PNA, respiratory acidosis on bipap.   eICU Interventions  Patient is responsive to verbal stimulus, knows where she is out, and easily arousable.  Currently on scheduled antibiotics vancomycin and cefepime, respiratory pathogen panel is pending, will add procalcitonin testing.  Add scheduled steroids as they seem to have fallen off.  Scheduled SVNs already in place.  No other acute intervention is indicated at this time.   2155 - Pt finally woke up on BIPAP. She became agitated and tried to pull off BIPAP mask. QTC 539. Mitts placed. PRN Precedex overnight if agitation worsens   Intervention Category Evaluation Type: New Patient Evaluation  Siena Poehler 05/11/2022, 8:28 PM

## 2022-05-11 NOTE — Progress Notes (Signed)
Patient transported to CT without event.

## 2022-05-11 NOTE — Progress Notes (Signed)
Critical ABG results given to ED MD. 

## 2022-05-11 NOTE — Progress Notes (Signed)
Critical ABG results were given to Dr. Tamala Julian

## 2022-05-11 NOTE — Sepsis Progress Note (Signed)
Elink monitoring for the code sepsis protocol.  

## 2022-05-11 NOTE — Progress Notes (Signed)
Patient transported from CT to room 2M10 without event.

## 2022-05-11 NOTE — ED Notes (Signed)
RN attempted report for the 4th time

## 2022-05-11 NOTE — ED Triage Notes (Signed)
Pt from [facility] BIB [facility] for CODE SEPSIS, SNF reports AMS, SOB, feverish to touch, pt being treated for bilateral wounds to LE. Pt unable to respond with words, pt only moans, this is new for pt. LKW is unknown. Possible LNW was last night for evening rounds at SNF, staff found pt alter and Sob at 0600 am, PTA . Pt arrived with NRB, sats 84% on 2L [place] upon EMS arrival. BP 141/59, RR 30s.

## 2022-05-11 NOTE — Consult Note (Signed)
NAME:  Grace Short, MRN:  [id number], DOB:  [birth year], LOS: 0 ADMISSION DATE:  05/11/2022, CONSULTATION DATE:  05/11/22 REFERRING MD:  Tamala Julian CHIEF COMPLAINT:  AMS, sepsis   History of Present Illness:  Grace Short is a 77 y.o. F with PMH of HTN, HL, CHF, COPD on 2L home O2, seizures, chronic lymphedema who was noted to be altered with labored breathing at SNF on the day of admission. EMS was called and pt was 84% on 2L.  She was brought to the ED where she was placed on non-rebreather and fever up to 101.6, she was noted to be hypercarbic on ABG so transitioned to Bipap.  She had some shaking up the upper extremities so EEG was ordered.   She underwent CT of the chest /abd/pelvis showing RUL PNA.  Labs significant for normal lactic acid, covid, flu and RSV negative, no leukocytosis, BNP and troponin WNL and procalcitonin <0.1.   She was treated with cefepime, vancomycin and flagyl and admitted to the hospital medicine service, however had repeated ABG's showing respiratory acidosis and AMS, so PCCM consulted  Pertinent  Medical History   has a past medical history of Chronic acquired lymphedema, Chronic heart failure with preserved ejection fraction (HFpEF) ([place]), COPD (chronic obstructive pulmonary disease) ([place]), CVA (cerebral vascular accident) ([place]), DM (diabetes mellitus) ([place]), HLD (hyperlipidemia), Hypertension, Morbid obesity with BMI of 50.0-59.9, adult ([place]), Partial seizure disorder ([place]), and SSS (sick sinus syndrome) ([place]).   Significant Hospital Events: Including procedures, antibiotic start and stop dates in addition to other pertinent events   3/5 presented to the ED from snf with AMS and hypoxia 3/6 persistent respiratory acidosis, PCCM consult, on bipap  Interim History / Subjective:  Pt sleeping but arousable and following commands  Objective   Blood pressure (!) 115/46, pulse 67, temperature (!) 97.2 F (36.2 C), temperature source Temporal, resp. rate 15, height '5\' 2"'$   (1.575 m), weight 130.9 kg, SpO2 100 %.    FiO2 (%):  [40 %-60 %] 60 %  No intake or output data in the 24 hours ending 05/11/22 1626 Filed Weights   05/11/22 0644  Weight: 130.9 kg    General:  obese F, sleeping in bed on bipap, acutely ill-appearing in no distress HEENT: MM pink/moist, sclera anicteric Neuro: somnolent but arousable and following commands, answering questions  CV: s1s2 rrr, no m/r/g PULM:  decreased air entry bilateral bases without rhonchi or wheezing, no tachypnea or accessory muscle use GI: soft, obese, non-tender to palpation Extremities: warm/dry, chronic lymphedema, no wounds or skin breakdown  Skin: no rashes or lesions   Resolved Hospital Problem list     Assessment & Plan:    Acute hypoxic and hypercarbic respiratory failure and sepsis secondary to RUL pneumonia CT chest with RUL PNA, CTA pending to rule out PE -admit to ICU for close monitoring, at risk for intubation -continue Bipap, MV increased -continue vanc/cefepime -RSV, covid and flu negative, send extended RVP, legionella and strep pneumo with respiratory cultures if we can obtain -consider steroids for severe PNA if worsening    Acute Encephalopathy CTH negative -ammonia WNL -likely secondary to sepsis -spot EEG pending    History of partial seizures -continue keppra and seizure precautions -EEG read pending -prn Ativan  Type 2 DM -Aic and SSI   HFpEF SSS S/p PPM -last echo in care everywhere 2021 with EF 55%, check repeat -monitor on telemetry    Best Practice (right click and "Reselect all SmartList Selections" daily)  Diet/type: NPO DVT prophylaxis: LMWH GI prophylaxis: N/A Lines: N/A Foley:  N/A Code Status:  full code Last date of multidisciplinary goals of care discussion [pending]  Labs   CBC: Recent Labs  Lab 05/11/22 0726 05/11/22 0733 05/11/22 0734 05/11/22 0917 05/11/22 1133 05/11/22 1539  WBC 7.2  --   --   --   --   --   NEUTROABS 5.0   --   --   --   --   --   HGB 12.6 14.3 14.3 12.6 11.6* 12.2  HCT 43.0 42.0 42.0 37.0 34.0* 36.0  MCV 89.0  --   --   --   --   --   PLT 158  --   --   --   --   --     Basic Metabolic Panel: Recent Labs  Lab 05/11/22 0726 05/11/22 0733 05/11/22 0734 05/11/22 0917 05/11/22 1133 05/11/22 1539  NA 141 143 143 145 145 146*  K 3.9 4.0 4.0 3.7 3.6 4.2  CL 102 101  --   --   --   --   CO2 35*  --   --   --   --   --   GLUCOSE 201* 203*  --   --   --   --   BUN 18 20  --   --   --   --   CREATININE 0.89 0.90  --   --   --   --   CALCIUM 8.8*  --   --   --   --   --   MG 1.9  --   --   --   --   --    GFR: Estimated Creatinine Clearance: 68.1 mL/min (by C-G formula based on SCr of 0.9 mg/dL). Recent Labs  Lab 05/11/22 0726 05/11/22 0945  PROCALCITON <0.10  --   WBC 7.2  --   LATICACIDVEN 1.0 1.0    Liver Function Tests: Recent Labs  Lab 05/11/22 0726  AST 15  ALT 12  ALKPHOS 54  BILITOT 0.5  PROT 7.2  ALBUMIN 3.2*   No results for input(s): "LIPASE", "AMYLASE" in the last 168 hours. Recent Labs  Lab 05/11/22 0726  AMMONIA 24    ABG    Component Value Date/Time   PHART 7.217 (L) 05/11/2022 1539   PCO2ART 77.5 (HH) 05/11/2022 1539   PO2ART 77 (L) 05/11/2022 1539   HCO3 31.9 (H) 05/11/2022 1539   TCO2 34 (H) 05/11/2022 1539   O2SAT 92 05/11/2022 1539     Coagulation Profile: Recent Labs  Lab 05/11/22 0726  INR 1.1    Cardiac Enzymes: No results for input(s): "CKTOTAL", "CKMB", "CKMBINDEX", "TROPONINI" in the last 168 hours.  HbA1C: Hgb A1c MFr Bld  Date/Time Value Ref Range Status  09/07/2020 05:35 AM 6.8 (H) 4.8 - 5.6 % Final    Comment:    (NOTE)         Prediabetes: 5.7 - 6.4         Diabetes: >6.4         Glycemic control for adults with diabetes: <7.0   12/08/2019 04:22 AM 7.6 (H) 4.8 - 5.6 % Final    Comment:    (NOTE) Pre diabetes:          5.7%-6.4%  Diabetes:              >6.4%  Glycemic control for   <7.0% adults with  diabetes  CBG: No results for input(s): "GLUCAP" in the last 168 hours.  Review of Systems:   Unable to obtain secondary to bipap and encephalopathy  Past Medical History:  She,  has a past medical history of Chronic acquired lymphedema, Chronic heart failure with preserved ejection fraction (HFpEF) (HCC), COPD (chronic obstructive pulmonary disease) ([place]), CVA (cerebral vascular accident) ([place]), DM (diabetes mellitus) ([place]), HLD (hyperlipidemia), Hypertension, Morbid obesity with BMI of 50.0-59.9, adult ([place]), Partial seizure disorder ([place]), and SSS (sick sinus syndrome) ([place]).   Surgical History:   Past Surgical History:  Procedure Laterality Date   APPENDECTOMY     BREAST SURGERY     colectomy     rectal surgery   HERNIA REPAIR     PACEMAKER IMPLANT     TONSILLECTOMY       Social History:   reports that she has never smoked. She has never used smokeless tobacco. She reports that she does not drink alcohol and does not use drugs.   Family History:  Her family history includes Diabetes in her sister; Heart disease in her sister.   Allergies Allergies  Allergen Reactions   Ativan [Lorazepam] Other (See Comments)    "Stopped breathing"     Home Medications  Prior to Admission medications   Medication Sig Start Date End Date Taking? Authorizing Provider  acetaminophen (TYLENOL) 325 MG tablet Take 650 mg by mouth every 4 (four) hours as needed for mild pain or moderate pain.   Yes [provider]  albuterol (VENTOLIN HFA) 108 (90 Base) MCG/ACT inhaler Inhale 2 puffs into the lungs once as needed for wheezing or shortness of breath (for Grade 3 or 4 hypersensitivity reaction). Patient taking differently: Inhale 2 puffs into the lungs every 4 (four) hours as needed for wheezing or shortness of breath. 12/14/19  Yes Shelly Coss, MD  atorvastatin (LIPITOR) 20 MG tablet Take 20 mg by mouth at bedtime. 08/22/19  Yes [provider]  bisacodyl (DULCOLAX) 10  MG suppository Place 10 mg rectally as needed for moderate constipation. If not relived by milk of mag, give '10mg'$  bisacodyl suppository rectally x 1 dose in 24 hours as needed.   Yes [provider]  carvedilol (COREG) 12.5 MG tablet Take 1 tablet (12.5 mg total) by mouth 2 (two) times daily with a meal. 09/12/20  Yes Wieting, Richard, MD  cholecalciferol (VITAMIN D3) 25 MCG (1000 UNIT) tablet Take 1,000 Units by mouth every other day.   Yes [provider]  dorzolamide-timolol (COSOPT) 22.3-6.8 MG/ML ophthalmic solution Place 1 drop into both eyes 2 (two) times daily.   Yes [provider]  furosemide (LASIX) 40 MG tablet Take 1 tablet (40 mg total) by mouth daily. 12/14/19  Yes Shelly Coss, MD  guaiFENesin (ROBITUSSIN) 100 MG/5ML liquid Take 10 mLs by mouth every 4 (four) hours as needed for cough or to loosen phlegm.   Yes [provider]  insulin glargine (LANTUS) 100 UNIT/ML injection Inject 0.13 mLs (13 Units total) into the skin 2 (two) times daily. Patient taking differently: Inject 26 Units into the skin at bedtime. 09/15/20  Yes Wieting, Richard, MD  insulin lispro (HUMALOG) 100 UNIT/ML injection Inject 0.06 mLs (6 Units total) into the skin 3 (three) times daily with meals. Inject 10u under the skin three times daily at mealtimes (plus applicable sliding scale) - do not use if blood glucose <150 Patient taking differently: Inject 6 Units into the skin 3 (three) times daily with meals. *SLIDING SCALE* Inject 10u under  the skin three times daily at mealtimes (plus applicable sliding scale) - do not use if blood glucose <150 09/12/20  Yes Wieting, Richard, MD  levETIRAcetam (KEPPRA) 750 MG tablet Take 750 mg by mouth 2 (two) times daily. 10/31/19  Yes [provider]  linagliptin (TRADJENTA) 5 MG TABS tablet Take 5 mg by mouth every morning.   Yes [provider]  loratadine (CLARITIN) 10 MG tablet Take 10 mg by mouth daily as needed for  allergies.   Yes [provider]  magnesium hydroxide (MILK OF MAGNESIA) 400 MG/5ML suspension Take 30 mLs by mouth daily as needed for mild constipation. If no BM in 3 days, give 10m of milk of magnesia by mouth x 1 dose in 24 hours as needed.   Yes [provider]  nystatin (MYCOSTATIN/NYSTOP) powder Apply 1 Application topically daily. Apply topically to folds on bilateral thighs, groin and between buttocks once daily.   Yes [provider]  nystatin powder Apply 1 Application topically as needed (rash). Apply topically to groin and between buttocks as needed.   Yes [provider]  OXYGEN Inhale 2 L into the lungs as needed (maintain O2 SAT > 90%).   Yes [provider]  sacubitril-valsartan (ENTRESTO) 97-103 MG Take 1 tablet by mouth 2 (two) times daily. 09/12/20  Yes Wieting, Richard, MD  Sodium Phosphates (RA SALINE ENEMA RE) Place 1 enema rectally as needed (constipation). If not relieved by bisacodyl suppository, give disposable saline enema rectally x 1 dose/24 hours as needed.   Yes [provider]  spironolactone (ALDACTONE) 25 MG tablet Take 0.5 tablets (12.5 mg total) by mouth daily. 09/16/20  Yes Wieting, Richard, MD  vitamin B-12 (CYANOCOBALAMIN) 100 MCG tablet Take 100 mcg by mouth every other day.   Yes [provider]  vitamin C (ASCORBIC ACID) 250 MG tablet Take 250 mg by mouth every other day.   Yes [provider]  amoxicillin-clavulanate (AUGMENTIN) 875-125 MG tablet Take 1 tablet by mouth 2 (two) times daily. 05/10/22   [provider]  azithromycin (ZITHROMAX) 250 MG tablet Take 250-500 mg by mouth See admin instructions. Take '500mg'$  by mouth for 1 day and then '250mg'$  by mouth daily for 4 days. 05/10/22   [provider]  cholestyramine (QUESTRAN) 4 g packet Take 1 packet (4 g total) by mouth 2 (two) times daily for 7 days. 09/15/20 09/22/20  WLoletha Grayer MD  hydrocerin (EUCERIN) CREA Apply 1  application topically daily. Patient not taking: Reported on 04/03/2022 09/13/20   WLoletha Grayer MD  predniSONE (DELTASONE) 20 MG tablet Take 40 mg by mouth daily. 05/10/22   [provider]     Critical care time:  40 minutes      CRITICAL CARE Performed by: LOtilio CarpenGleason   Total critical care time: 40 minutes  Critical care time was exclusive of separately billable procedures and treating other patients.  Critical care was necessary to treat or prevent imminent or life-threatening deterioration.  Critical care was time spent personally by me on the following activities: development of treatment plan with patient and/or surrogate as well as nursing, discussions with consultants, evaluation of patient's response to treatment, examination of patient, obtaining history from patient or surrogate, ordering and performing treatments and interventions, ordering and review of laboratory studies, ordering and review of radiographic studies, pulse oximetry and re-evaluation of patient's condition.  LOtilio CarpenGleason, PA-C [facility] See Amion for pager If no response to pager ,  please call [phone] until 7pm After 7:00 pm call Elink  [phone]

## 2022-05-12 ENCOUNTER — Inpatient Hospital Stay (HOSPITAL_COMMUNITY): Payer: 59

## 2022-05-12 DIAGNOSIS — J9622 Acute and chronic respiratory failure with hypercapnia: Secondary | ICD-10-CM | POA: Diagnosis not present

## 2022-05-12 DIAGNOSIS — J9621 Acute and chronic respiratory failure with hypoxia: Secondary | ICD-10-CM | POA: Diagnosis not present

## 2022-05-12 LAB — BASIC METABOLIC PANEL
Anion gap: 6 (ref 5–15)
BUN: 25 mg/dL — ABNORMAL HIGH (ref 8–23)
CO2: 30 mmol/L (ref 22–32)
Calcium: 8.7 mg/dL — ABNORMAL LOW (ref 8.9–10.3)
Chloride: 107 mmol/L (ref 98–111)
Creatinine, Ser: 0.94 mg/dL (ref 0.44–1.00)
GFR, Estimated: >60 mL/min (ref >60)
Glucose, Bld: 185 mg/dL — ABNORMAL HIGH (ref 70–99)
Potassium: 4.2 mmol/L (ref 3.5–5.1)
Sodium: 143 mmol/L (ref 135–145)

## 2022-05-12 LAB — GLUCOSE, CAPILLARY
Glucose-Capillary: 156 mg/dL — ABNORMAL HIGH (ref 70–99)
Glucose-Capillary: 178 mg/dL — ABNORMAL HIGH (ref 70–99)
Glucose-Capillary: 193 mg/dL — ABNORMAL HIGH (ref 70–99)
Glucose-Capillary: 200 mg/dL — ABNORMAL HIGH (ref 70–99)
Glucose-Capillary: 200 mg/dL — ABNORMAL HIGH (ref 70–99)
Glucose-Capillary: 219 mg/dL — ABNORMAL HIGH (ref 70–99)
Glucose-Capillary: 255 mg/dL — ABNORMAL HIGH (ref 70–99)

## 2022-05-12 LAB — POCT I-STAT 7, (LYTES, BLD GAS, ICA,H+H)
Acid-base deficit: 3 mmol/L — ABNORMAL HIGH (ref 0.0–2.0)
Acid-base deficit: 2 mmol/L (ref 0.0–2.0)
Bicarbonate: 27 mmol/L (ref 20.0–28.0)
Bicarbonate: 24.4 mmol/L (ref 20.0–28.0)
Calcium, Ion: 1.35 mmol/L (ref 1.15–1.40)
Calcium, Ion: 1.28 mmol/L (ref 1.15–1.40)
HCT: 38 % (ref 36.0–46.0)
HCT: 38 % (ref 36.0–46.0)
Hemoglobin: 12.9 g/dL (ref 12.0–15.0)
Hemoglobin: 12.9 g/dL (ref 12.0–15.0)
O2 Saturation: 100 %
O2 Saturation: 100 %
Patient temperature: 98
Patient temperature: 97.6
Potassium: 4.3 mmol/L (ref 3.5–5.1)
Potassium: 4.3 mmol/L (ref 3.5–5.1)
Sodium: 145 mmol/L (ref 135–145)
Sodium: 144 mmol/L (ref 135–145)
TCO2: 29 mmol/L (ref 22–32)
TCO2: 26 mmol/L (ref 22–32)
pCO2 arterial: 75.2 mmHg (ref 32–48)
pCO2 arterial: 44.9 mmHg (ref 32–48)
pH, Arterial: 7.162 — CL (ref 7.35–7.45)
pH, Arterial: 7.341 — ABNORMAL LOW (ref 7.35–7.45)
pO2, Arterial: 226 mmHg — ABNORMAL HIGH (ref 83–108)
pO2, Arterial: 357 mmHg — ABNORMAL HIGH (ref 83–108)

## 2022-05-12 LAB — MAGNESIUM
Magnesium: 1.9 mg/dL (ref 1.7–2.4)
Magnesium: 2.4 mg/dL (ref 1.7–2.4)

## 2022-05-12 LAB — CBC
HCT: 41.6 % (ref 36.0–46.0)
Hemoglobin: 11.5 g/dL — ABNORMAL LOW (ref 12.0–15.0)
MCH: 25.4 pg — ABNORMAL LOW (ref 26.0–34.0)
MCHC: 27.6 g/dL — ABNORMAL LOW (ref 30.0–36.0)
MCV: 92 fL (ref 80.0–100.0)
Platelets: 119 10*3/uL — ABNORMAL LOW (ref 150–400)
RBC: 4.52 MIL/uL (ref 3.87–5.11)
RDW: 15.3 % (ref 11.5–15.5)
WBC: 4.2 10*3/uL (ref 4.0–10.5)
nRBC: 0 % (ref 0.0–0.2)

## 2022-05-12 LAB — HEMOGLOBIN A1C
Hgb A1c MFr Bld: 7.3 % — ABNORMAL HIGH (ref 4.8–5.6)
Mean Plasma Glucose: 163 mg/dL

## 2022-05-12 LAB — PHOSPHORUS: Phosphorus: 1.1 mg/dL — ABNORMAL LOW (ref 2.5–4.6)

## 2022-05-12 MED ORDER — FENTANYL CITRATE PF 50 MCG/ML IJ SOSY
25.0000 ug | PREFILLED_SYRINGE | INTRAMUSCULAR | Status: DC | PRN
  Administered 2022-05-12 (×3): 100 ug via INTRAVENOUS
  Filled 2022-05-12 (×2): qty 2
  Filled 2022-05-12: qty 1

## 2022-05-12 MED ORDER — DOCUSATE SODIUM 50 MG/5ML PO LIQD
100.0000 mg | Freq: Two times a day (BID) | ORAL | Status: DC
  Administered 2022-05-12 – 2022-05-19 (×13): 100 mg
  Filled 2022-05-12 (×13): qty 10

## 2022-05-12 MED ORDER — ORAL CARE MOUTH RINSE: 15.0000 mL | OROMUCOSAL | Status: DC | PRN

## 2022-05-12 MED ORDER — MIDAZOLAM HCL 2 MG/2ML IJ SOLN: 1.0000 mg | INTRAMUSCULAR | Status: DC | PRN

## 2022-05-12 MED ORDER — HYDRALAZINE HCL 20 MG/ML IJ SOLN
10.0000 mg | Freq: Four times a day (QID) | INTRAMUSCULAR | Status: DC | PRN
  Administered 2022-05-12 – 2022-05-14 (×5): 10 mg via INTRAVENOUS
  Filled 2022-05-12 (×6): qty 1

## 2022-05-12 MED ORDER — PROSOURCE TF20 ENFIT COMPATIBL EN LIQD
60.0000 mL | Freq: Every day | ENTERAL | Status: DC
  Administered 2022-05-12 – 2022-05-19 (×8): 60 mL
  Filled 2022-05-12 (×8): qty 60

## 2022-05-12 MED ORDER — LEVETIRACETAM 100 MG/ML PO SOLN
750.0000 mg | Freq: Two times a day (BID) | ORAL | Status: DC
  Administered 2022-05-12 – 2022-05-19 (×15): 750 mg
  Filled 2022-05-12 (×15): qty 10

## 2022-05-12 MED ORDER — PHENYLEPHRINE 80 MCG/ML (10ML) SYRINGE FOR IV PUSH (FOR BLOOD PRESSURE SUPPORT)
PREFILLED_SYRINGE | INTRAVENOUS | Status: AC
  Filled 2022-05-12: qty 10

## 2022-05-12 MED ORDER — FENTANYL CITRATE PF 50 MCG/ML IJ SOSY
PREFILLED_SYRINGE | INTRAMUSCULAR | Status: AC
  Filled 2022-05-12: qty 1

## 2022-05-12 MED ORDER — ROCURONIUM BROMIDE 10 MG/ML (PF) SYRINGE
PREFILLED_SYRINGE | INTRAVENOUS | Status: AC
  Filled 2022-05-12: qty 10

## 2022-05-12 MED ORDER — FENTANYL 2500MCG IN NS 250ML (10MCG/ML) PREMIX INFUSION
25.0000 ug/h | INTRAVENOUS | Status: DC
  Administered 2022-05-13 – 2022-05-16 (×4): 100 ug/h via INTRAVENOUS
  Filled 2022-05-12 (×6): qty 250

## 2022-05-12 MED ORDER — FENTANYL CITRATE PF 50 MCG/ML IJ SOSY
PREFILLED_SYRINGE | INTRAMUSCULAR | Status: AC
  Administered 2022-05-12: 50 ug
  Filled 2022-05-12: qty 2

## 2022-05-12 MED ORDER — SODIUM PHOSPHATES 45 MMOLE/15ML IV SOLN
45.0000 mmol | Freq: Once | INTRAVENOUS | Status: AC
  Administered 2022-05-12: 45 mmol via INTRAVENOUS
  Filled 2022-05-12: qty 15

## 2022-05-12 MED ORDER — MIDAZOLAM HCL 2 MG/2ML IJ SOLN
1.0000 mg | INTRAMUSCULAR | Status: DC | PRN
  Administered 2022-05-12 (×2): 2 mg via INTRAVENOUS
  Administered 2022-05-15: 1 mg via INTRAVENOUS
  Administered 2022-05-18: 2 mg via INTRAVENOUS
  Filled 2022-05-12 (×6): qty 2

## 2022-05-12 MED ORDER — INSULIN GLARGINE-YFGN 100 UNIT/ML ~~LOC~~ SOLN
10.0000 [IU] | Freq: Every day | SUBCUTANEOUS | Status: DC
  Administered 2022-05-12: 10 [IU] via SUBCUTANEOUS
  Filled 2022-05-12 (×2): qty 0.1

## 2022-05-12 MED ORDER — SODIUM CHLORIDE 0.9 % IV SOLN
2.0000 g | INTRAVENOUS | Status: AC
  Administered 2022-05-12 – 2022-05-15 (×4): 2 g via INTRAVENOUS
  Filled 2022-05-12 (×4): qty 20

## 2022-05-12 MED ORDER — ETOMIDATE 2 MG/ML IV SOLN
INTRAVENOUS | Status: AC
  Administered 2022-05-12: 20 mg
  Filled 2022-05-12: qty 20

## 2022-05-12 MED ORDER — PANTOPRAZOLE SODIUM 40 MG IV SOLR
40.0000 mg | INTRAVENOUS | Status: DC
  Administered 2022-05-12 – 2022-05-19 (×8): 40 mg via INTRAVENOUS
  Filled 2022-05-12 (×8): qty 10

## 2022-05-12 MED ORDER — FENTANYL CITRATE PF 50 MCG/ML IJ SOSY: 25.0000 ug | PREFILLED_SYRINGE | INTRAMUSCULAR | Status: DC | PRN

## 2022-05-12 MED ORDER — ORAL CARE MOUTH RINSE
15.0000 mL | OROMUCOSAL | Status: DC
  Administered 2022-05-12 – 2022-05-19 (×82): 15 mL via OROMUCOSAL

## 2022-05-12 MED ORDER — FENTANYL BOLUS VIA INFUSION
25.0000 ug | INTRAVENOUS | Status: DC | PRN
  Administered 2022-05-12 – 2022-05-16 (×22): 100 ug via INTRAVENOUS

## 2022-05-12 MED ORDER — POLYETHYLENE GLYCOL 3350 17 G PO PACK
17.0000 g | PACK | Freq: Every day | ORAL | Status: DC
  Administered 2022-05-12 – 2022-05-19 (×7): 17 g
  Filled 2022-05-12 (×8): qty 1

## 2022-05-12 MED ORDER — MAGNESIUM SULFATE 2 GM/50ML IV SOLN
2.0000 g | Freq: Once | INTRAVENOUS | Status: AC
  Administered 2022-05-12: 2 g via INTRAVENOUS
  Filled 2022-05-12: qty 50

## 2022-05-12 MED ORDER — VITAL HIGH PROTEIN PO LIQD
1000.0000 mL | ORAL | Status: DC
  Administered 2022-05-12 – 2022-05-15 (×5): 1000 mL

## 2022-05-12 MED ORDER — FENTANYL 2500MCG IN NS 250ML (10MCG/ML) PREMIX INFUSION
INTRAVENOUS | Status: AC
  Administered 2022-05-12: 25 ug/h via INTRAVENOUS
  Filled 2022-05-12: qty 250

## 2022-05-12 MED ORDER — MIDAZOLAM HCL 2 MG/2ML IJ SOLN
INTRAMUSCULAR | Status: AC
  Administered 2022-05-12: 1 mg
  Filled 2022-05-12: qty 2

## 2022-05-12 MED ORDER — VITAL HIGH PROTEIN PO LIQD
1000.0000 mL | ORAL | Status: DC
  Administered 2022-05-12: 1000 mL

## 2022-05-12 NOTE — Progress Notes (Addendum)
eLink Physician-Brief Progress Note Patient Name: Grace Short. Heckmann DOB: February 14, 1946 MRN: [id number]   Date of Service  05/12/2022  HPI/Events of Note  77 year old female with acute encephalopathy, fever, right upper lobe pneumonia in the setting of respiratory acidosis.  eICU Interventions  Phos 1.1 -> Sodium phos   0347 - Refractory Hyperglycemia, increase sensitivity to the SSI and increase glargine from 10 to 15 (home rate 13u) units now that she is at goal TF rate.   Intervention Category Minor Interventions: Electrolytes abnormality - evaluation and management  Braden Cimo 05/12/2022, 8:57 PM

## 2022-05-12 NOTE — Progress Notes (Signed)
Initial Nutrition Assessment  DOCUMENTATION CODES:   Morbid obesity  INTERVENTION:  - Modify TF regimen to Vital HP with advancement to goal of 50 mL/hr + TF20 PS x1.   - This will provide 1280 kcals, 125 gm protein and 1003 mL fluid.   NUTRITION DIAGNOSIS:   Inadequate oral intake related to inability to eat as evidenced by NPO status.  GOAL:   Provide needs based on ASPEN/SCCM guidelines  MONITOR:   TF tolerance  REASON FOR ASSESSMENT:   Consult, Ventilator Enteral/tube feeding initiation and management  ASSESSMENT:   77 y.o. female admits related to AMS and SOB. PMH includes: chronic acquired lymphedema, HFpEF, COPD, CVA, DM, HLD, HTN, SSS. Pt is currently receiving medical management related to chronic respiratory failure with hypoxia and hypercapnia.  Meds reviewed: colace, sliding scale insulin, semglee (10 units), solu-medrol, miralax. Labs reviewed: BUN elevated.   No family present at time of assessment. RD unable to gather nutrition hx details at this time. Pt is currently intubated with OG tube in place. PepUp protocol was ordered for the patient. RD will update order to meet the pt's needs. RD will continue to monitor POC and TF tolerance.   NUTRITION - FOCUSED PHYSICAL EXAM:  Flowsheet Row Most Recent Value  Orbital Region No depletion  Upper Arm Region No depletion  Thoracic and Lumbar Region No depletion  Buccal Region No depletion  Temple Region No depletion  Clavicle Bone Region No depletion  Clavicle and Acromion Bone Region No depletion  Scapular Bone Region No depletion  Dorsal Hand No depletion  Patellar Region No depletion  Anterior Thigh Region No depletion  Posterior Calf Region No depletion  Edema (RD Assessment) Mild  Hair Reviewed  Eyes Unable to assess  Mouth Unable to assess  Skin Reviewed  Nails Reviewed       Diet Order:   Diet Order             Diet NPO time specified  Diet effective now                    EDUCATION NEEDS:   Not appropriate for education at this time  Skin:  Skin Assessment: Reviewed RN Assessment  Last BM:  3/6 - type 5  Height:   Ht Readings from Last 1 Encounters:  05/12/22 '5\' 2"'$  (1.575 m)    Weight:   Wt Readings from Last 1 Encounters:  05/11/22 130.9 kg    Ideal Body Weight:     BMI:  Body mass index is 52.79 kg/m.  Estimated Nutritional Needs:   Kcal:  1100-1250 kcals  Protein:  100-125 gm  Fluid:  >/= 1.1 L  Thalia Bloodgood, RD, LDN, CNSC.

## 2022-05-12 NOTE — TOC Initial Note (Signed)
Transition of Care (TOC) - Initial/Assessment Note    Patient Details  Name: Grace Short. [place] MRN: [id number] Date of Birth: 04-13-1945  Transition of Care [facility]) CM/SW Contact:    Ninfa Meeker, RN Phone Number: 05/12/2022, 10:31 AM  Clinical Narrative:                  Transition of Care [facility]) Department has reviewed patient and no TOC needs have been identified at this time. We will continue to monitor patient advancement through Interdisciplinary progressions and if new patient needs arise, please place a consult.       Patient Goals and CMS Choice            Expected Discharge Plan and Services                                              Prior Living Arrangements/Services                       Activities of Daily Living      Permission Sought/Granted                  Emotional Assessment              Admission diagnosis:  Sepsis ([place]) [A41.9] Acute respiratory failure with hypoxia and hypercapnia ([place]) [J96.01, J96.02] Altered mental status, unspecified altered mental status type [R41.82] Sepsis with encephalopathy without septic shock, due to unspecified organism ([place]) [A41.9, R65.20, G93.41] Patient Active Problem List   Diagnosis Date Noted   Acute on chronic respiratory failure with hypoxia and hypercapnia ([place]) 05/11/2022   Sepsis ([place]) A999333   Acute metabolic encephalopathy A999333   Altered mental status 05/11/2022   Hypertensive urgency    Cellulitis of both lower extremities 09/07/2020   Hematuria    Lymphedema    Type 2 diabetes mellitus with hyperlipidemia ([place])    Morbid obesity with BMI of 50.0-59.9, adult (HCC)    Weakness    Chronic diastolic CHF (congestive heart failure) ([place]) 12/07/2019   Edema of both lower extremities due to peripheral venous insufficiency 12/07/2019   Stasis edema with ulcer and inflammation, bilateral ([employer]) 12/07/2019   SSS (sick sinus syndrome) ([place]) 12/07/2019   Pneumonia  due to COVID-19 virus 12/07/2019   HTN (hypertension) 12/07/2019   COPD with acute exacerbation ([place]) 12/07/2019   Acute respiratory failure with hypoxemia ([place]) 12/07/2019   Glaucoma 12/20/2017   Pacemaker 12/20/2017   Personal history of transient ischemic attack (TIA), and cerebral infarction without residual deficits 12/20/2017   Seizure disorder ([place]) 12/20/2017   Sleep apnea, unspecified 12/20/2017   Essential hypertension 12/20/2017   PCP:  Fanny Dance, MD Pharmacy:   [facility], [address] [place] [address] Phone: [phone] Fax: [phone]     Social Determinants of Health (SDOH) Social History: SDOH Screenings   Tobacco Use: Low Risk  (05/11/2022)   SDOH Interventions:     Readmission Risk Interventions     No data to display

## 2022-05-12 NOTE — Progress Notes (Signed)
[facility] ADULT ICU REPLACEMENT PROTOCOL   The patient does apply for the [facility] Adult ICU Electrolyte Replacment Protocol based on the criteria listed below:   1.Exclusion criteria: TCTS, ECMO, Dialysis, and Myasthenia Gravis patients 2. Is GFR >/= 30 ml/min? Yes.    Patient's GFR today is >60  3. Is SCr </= 2? Yes.   Patient's SCr is 0.94 mg/dL 4. Did SCr increase >/= 0.5 in 24 hours? No. 5.Pt's weight >40kg  Yes.   6. Abnormal electrolyte(s): Magnesium 1.9  7. Electrolytes replaced per protocol 8.  Call MD STAT for K+ </= 2.5, Phos </= 1, or Mag </= 1 Physician:  Dr. Glendon Axe A Antolin Belsito 05/12/2022 3:58 AM

## 2022-05-12 NOTE — Procedures (Signed)
Intubation Procedure Note  Aven V. Ritzert  [id number]  Dec 08, 1945  Date:05/12/22  Time:8:38 AM   Provider Performing:Axxel Gude Gomez-Caraballo    Procedure: Intubation (31500)  Indication(s) Respiratory Failure  Consent Unable to obtain consent due to emergent nature of procedure.   Anesthesia Versed and Fentanyl   Time Out Verified patient identification, verified procedure, site/side was marked, verified correct patient position, special equipment/implants available, medications/allergies/relevant history reviewed, required imaging and test results available.   Sterile Technique Usual hand hygeine, masks, and gloves were used   Procedure Description Patient positioned in bed supine.  Sedation given as noted above.  Patient was intubated with endotracheal tube using Glidescope.  View was Grade 2 only posterior commissure .  Number of attempts was 1.  Colorimetric CO2 detector was consistent with tracheal placement.   Complications/Tolerance None; patient tolerated the procedure well. Chest X-ray is ordered to verify placement.   EBL Minimal   Specimen(s) None

## 2022-05-12 NOTE — Consult Note (Addendum)
NAME:  Genette V. Vogelgesang, MRN:  [id number], DOB:  [birth year], LOS: 1 ADMISSION DATE:  05/07/2022, CONSULTATION DATE:  05/12/22 REFERRING MD:  Tamala Julian CHIEF COMPLAINT:  AMS, sepsis   History of Present Illness:  Wendell Fiebig is a 77 y.o. F with PMH of HTN, HL, CHF, COPD on 2L home O2, seizures, chronic lymphedema who was noted to be altered with labored breathing at SNF on the day of admission. EMS was called and pt was 84% on 2L.  She was brought to the ED where she was placed on non-rebreather and fever up to 101.6, she was noted to be hypercarbic on ABG so transitioned to Bipap.  She had some shaking up the upper extremities so EEG was ordered.   She underwent CT of the chest /abd/pelvis showing RUL PNA.  Labs significant for normal lactic acid, covid, flu and RSV negative, no leukocytosis, BNP and troponin WNL and procalcitonin <0.1.   She was treated with cefepime, vancomycin and flagyl and admitted to the hospital medicine service, however had repeated ABG's showing respiratory acidosis and AMS, so PCCM consulted  Pertinent  Medical History   has a past medical history of Chronic acquired lymphedema, Chronic heart failure with preserved ejection fraction (HFpEF) ([place]), COPD (chronic obstructive pulmonary disease) ([place]), CVA (cerebral vascular accident) ([place]), DM (diabetes mellitus) ([place]), HLD (hyperlipidemia), Hypertension, Morbid obesity with BMI of 50.0-59.9, adult ([place]), Partial seizure disorder ([place]), and SSS (sick sinus syndrome) ([place]).   Significant Hospital Events: Including procedures, antibiotic start and stop dates in addition to other pertinent events   3/5 presented to the ED from snf with AMS and hypoxia 3/6 persistent respiratory acidosis, PCCM consult, on bipap 3/7 Intubated  Interim History / Subjective:  Worsening mental status. Required intubation  Objective   Blood pressure (!) 112/53, pulse 61, temperature 97.6 F (36.4 C), temperature source Axillary, resp. rate (!) 31, height  5\' 2"  (1.575 m), weight 130.9 kg, SpO2 100 %.    FiO2 (%):  [40 %-100 %] 100 %   Intake/Output Summary (Last 24 hours) at 05/12/2022 8938 Last data filed at 05/12/2022 0620 Gross per 24 hour  Intake 457.97 ml  Output 650 ml  Net -192.03 ml   Filed Weights   05/28/2022 0644  Weight: 130.9 kg   Physical Exam: General: Chronically ill-appearing, drowsy HENT: [place], AT, ETT in place Eyes: EOMI, no scleral icterus Respiratory: Diminished with rhonchi to auscultation bilaterally Cardiovascular: RRR, -M/R/G, no JVD GI: BS+, soft, nontender Extremities: Chronic lymphedema, -Edema,-tenderness Neuro: AAO x4, CNII-XII grossly intact  CXR 3/7 ETT 2 cm above carina  Resolved Hospital Problem list   Hypokalemia - resolved  Assessment & Plan:   Acute encephalopathy secondary to respiratory failure - EEG neg Acute hypoxic and hypercarbic respiratory failure and sepsis secondary to metapneumovirus and RUL pneumonia COPD exacerbation --De-escalate to Ceftriaxone --Continue steroids --Scheduled nebs --PAD protocol: PRN fentanyl and Versed. Precedex available if needed  History of partial seizures -continue keppra and seizure precautions -EEG neg -PRN Versed for breakthrough seizures  Type 2 DM -Start lantus 10 daily  HFpEF SSS S/p PPM -last echo in care everywhere 2021 with EF 55%, check repeat -Tele -Hold BB, anti-hypertensive and diuretics in low-normal pressure   Best Practice (right click and "Reselect all SmartList Selections" daily)   Diet/type: tubefeeds DVT prophylaxis: LMWH GI prophylaxis: PPI Lines: N/A Foley:  N/A Code Status:  full code Last date of multidisciplinary goals of care discussion [pending] Update family today   Critical care  time:  45 minutes     The patient is critically ill with multiple organ systems failure and requires high complexity decision making for assessment and support, frequent evaluation and titration of therapies, application of  advanced monitoring technologies and extensive interpretation of multiple databases.  Independent Critical Care Time: 43 Minutes.   Rodman Pickle, M.D. [facility] Pulmonary/Critical Care Medicine 05/12/2022 8:14 AM   Please see Amion for pager number to reach on-call Pulmonary and Critical Care Team.

## 2022-05-13 DIAGNOSIS — J9621 Acute and chronic respiratory failure with hypoxia: Secondary | ICD-10-CM | POA: Diagnosis not present

## 2022-05-13 LAB — BASIC METABOLIC PANEL
Anion gap: 9 (ref 5–15)
BUN: 43 mg/dL — ABNORMAL HIGH (ref 8–23)
CO2: 25 mmol/L (ref 22–32)
Calcium: 8.7 mg/dL — ABNORMAL LOW (ref 8.9–10.3)
Chloride: 109 mmol/L (ref 98–111)
Creatinine, Ser: 0.98 mg/dL (ref 0.44–1.00)
GFR, Estimated: 59 mL/min — ABNORMAL LOW (ref >60)
Glucose, Bld: 391 mg/dL — ABNORMAL HIGH (ref 70–99)
Potassium: 3.5 mmol/L (ref 3.5–5.1)
Sodium: 143 mmol/L (ref 135–145)

## 2022-05-13 LAB — POCT I-STAT 7, (LYTES, BLD GAS, ICA,H+H)
Acid-base deficit: 1 mmol/L (ref 0.0–2.0)
Bicarbonate: 23.3 mmol/L (ref 20.0–28.0)
Calcium, Ion: 1.27 mmol/L (ref 1.15–1.40)
HCT: 36 % (ref 36.0–46.0)
Hemoglobin: 12.2 g/dL (ref 12.0–15.0)
O2 Saturation: 97 %
Patient temperature: 98.7
Potassium: 3.2 mmol/L — ABNORMAL LOW (ref 3.5–5.1)
Sodium: 147 mmol/L — ABNORMAL HIGH (ref 135–145)
TCO2: 24 mmol/L (ref 22–32)
pCO2 arterial: 35.5 mmHg (ref 32–48)
pH, Arterial: 7.425 (ref 7.35–7.45)
pO2, Arterial: 86 mmHg (ref 83–108)

## 2022-05-13 LAB — CBC
HCT: 37 % (ref 36.0–46.0)
Hemoglobin: 11.8 g/dL — ABNORMAL LOW (ref 12.0–15.0)
MCH: 26.2 pg (ref 26.0–34.0)
MCHC: 31.9 g/dL (ref 30.0–36.0)
MCV: 82.2 fL (ref 80.0–100.0)
Platelets: 128 10*3/uL — ABNORMAL LOW (ref 150–400)
RBC: 4.5 MIL/uL (ref 3.87–5.11)
RDW: 15.7 % — ABNORMAL HIGH (ref 11.5–15.5)
WBC: 8.8 10*3/uL (ref 4.0–10.5)
nRBC: 0 % (ref 0.0–0.2)

## 2022-05-13 LAB — GLUCOSE, CAPILLARY
Glucose-Capillary: 160 mg/dL — ABNORMAL HIGH (ref 70–99)
Glucose-Capillary: 179 mg/dL — ABNORMAL HIGH (ref 70–99)
Glucose-Capillary: 253 mg/dL — ABNORMAL HIGH (ref 70–99)
Glucose-Capillary: 286 mg/dL — ABNORMAL HIGH (ref 70–99)
Glucose-Capillary: 352 mg/dL — ABNORMAL HIGH (ref 70–99)
Glucose-Capillary: 368 mg/dL — ABNORMAL HIGH (ref 70–99)

## 2022-05-13 LAB — PHOSPHORUS
Phosphorus: 3.3 mg/dL (ref 2.5–4.6)
Phosphorus: 2.7 mg/dL (ref 2.5–4.6)

## 2022-05-13 LAB — MAGNESIUM
Magnesium: 2.2 mg/dL (ref 1.7–2.4)
Magnesium: 2.3 mg/dL (ref 1.7–2.4)

## 2022-05-13 MED ORDER — INSULIN GLARGINE-YFGN 100 UNIT/ML ~~LOC~~ SOLN
25.0000 [IU] | Freq: Every day | SUBCUTANEOUS | Status: DC
  Administered 2022-05-13: 25 [IU] via SUBCUTANEOUS
  Filled 2022-05-13 (×2): qty 0.25

## 2022-05-13 MED ORDER — POTASSIUM CHLORIDE 20 MEQ PO PACK
40.0000 meq | PACK | Freq: Once | ORAL | Status: AC
  Administered 2022-05-13: 40 meq
  Filled 2022-05-13: qty 2

## 2022-05-13 MED ORDER — INSULIN GLARGINE-YFGN 100 UNIT/ML ~~LOC~~ SOLN: 5.0000 [IU] | Freq: Every day | SUBCUTANEOUS | Status: DC

## 2022-05-13 MED ORDER — INSULIN ASPART 100 UNIT/ML IJ SOLN
4.0000 [IU] | INTRAMUSCULAR | Status: DC
  Administered 2022-05-13 – 2022-05-14 (×7): 4 [IU] via SUBCUTANEOUS

## 2022-05-13 MED ORDER — INSULIN ASPART 100 UNIT/ML IJ SOLN
0.0000 [IU] | INTRAMUSCULAR | Status: DC
  Administered 2022-05-13 (×2): 11 [IU] via SUBCUTANEOUS
  Administered 2022-05-13 (×2): 20 [IU] via SUBCUTANEOUS
  Administered 2022-05-13: 3 [IU] via SUBCUTANEOUS
  Administered 2022-05-14: 4 [IU] via SUBCUTANEOUS
  Administered 2022-05-14 (×2): 11 [IU] via SUBCUTANEOUS
  Administered 2022-05-14: 4 [IU] via SUBCUTANEOUS
  Administered 2022-05-14: 7 [IU] via SUBCUTANEOUS
  Administered 2022-05-14 (×2): 11 [IU] via SUBCUTANEOUS
  Administered 2022-05-15: 7 [IU] via SUBCUTANEOUS
  Administered 2022-05-15 (×2): 4 [IU] via SUBCUTANEOUS
  Administered 2022-05-15: 7 [IU] via SUBCUTANEOUS
  Administered 2022-05-15 (×2): 3 [IU] via SUBCUTANEOUS
  Administered 2022-05-16 (×2): 4 [IU] via SUBCUTANEOUS
  Administered 2022-05-16: 3 [IU] via SUBCUTANEOUS
  Administered 2022-05-16: 4 [IU] via SUBCUTANEOUS
  Administered 2022-05-17: 3 [IU] via SUBCUTANEOUS
  Administered 2022-05-17 (×3): 4 [IU] via SUBCUTANEOUS
  Administered 2022-05-19: 3 [IU] via SUBCUTANEOUS

## 2022-05-13 MED ORDER — INSULIN GLARGINE-YFGN 100 UNIT/ML ~~LOC~~ SOLN
15.0000 [IU] | Freq: Every day | SUBCUTANEOUS | Status: DC
  Filled 2022-05-13: qty 0.15

## 2022-05-13 NOTE — Progress Notes (Signed)
NAME:  Grace Short, MRN:  [id number], DOB:  [birth year], LOS: 2 ADMISSION DATE:  05/17/2022, CONSULTATION DATE:  05/12/2022 REFERRING MD:  Dr. Tamala Julian, CHIEF COMPLAINT:  AMS, Sepsis   History of Present Illness:  Grace Short is a 77 y.o. F with PMH of HTN, HL, CHF, COPD on 2L home O2, seizures, chronic lymphedema who was noted to be altered with labored breathing at SNF on the day of admission. EMS was called and pt was 84% on 2L.  She was brought to the ED where she was placed on non-rebreather and fever up to 101.6, she was noted to be hypercarbic on ABG so transitioned to Bipap.  She had some shaking up the upper extremities so EEG was ordered.   She underwent CT of the chest /abd/pelvis showing RUL PNA.  Labs significant for normal lactic acid, covid, flu and RSV negative, no leukocytosis, BNP and troponin WNL and procalcitonin <0.1.   She was treated with cefepime, vancomycin and flagyl and admitted to the hospital medicine service, however had repeated ABG's showing respiratory acidosis and AMS, so PCCM consulted   Pertinent  Medical History  has a past medical history of Chronic acquired lymphedema, Chronic heart failure with preserved ejection fraction (HFpEF) ([place]), COPD (chronic obstructive pulmonary disease) ([place]), CVA (cerebral vascular accident) ([place]), DM (diabetes mellitus) ([place]), HLD (hyperlipidemia), Hypertension, Morbid obesity with BMI of 50.0-59.9, adult ([place]), Partial seizure disorder ([place]), and SSS (sick sinus syndrome) ([place]).   Significant Hospital Events: Including procedures, antibiotic start and stop dates in addition to other pertinent events   3/5 presented to the ED from snf with AMS and hypoxia 3/6 persistent respiratory acidosis, PCCM consult, on bipap 3/7 Intubated  Interim History / Subjective:  Hyperglycemic overnight On fentanyl 14.9, precedex 12.6   Objective   Blood pressure (!) 105/54, pulse 69, temperature 98.7 F (37.1 C), temperature source Axillary, resp.  rate (!) 28, height '5\' 2"'$  (1.575 m), weight 117.1 kg, SpO2 98 %.    Vent Mode: PRVC FiO2 (%):  [40 %-50 %] 40 % Set Rate:  [28 bmp] 28 bmp Vt Set:  [400 mL] 400 mL PEEP:  [5 cmH20] 5 cmH20 Plateau Pressure:  [17 cmH20-19 cmH20] 18 cmH20  Peak 20  Intake/Output Summary (Last 24 hours) at 05/13/2022 [id number] Last data filed at 05/13/2022 [id number] Gross per 24 hour  Intake 1593.04 ml  Output 550 ml  Net 1043.04 ml   Filed Weights   05/22/2022 0644 05/13/22 0500  Weight: 130.9 kg 117.1 kg   7.341/44.9/357 bicarbonate 24 SBT attempted and failed, back to full ventilatory support  Examination: General: Chronically ill-appearing, drowsy, arousable to pain HENT: [place]ERR[place], ETT in place Eyes: EOMI, no scleral icterus Respiratory: Diminished with rhonchi to auscultation bilaterally, no wheezing appreciated Cardiovascular: RRR, -M/R/G, no JVD GI: BS+, soft, nontender Extremities: Chronic lymphedema with wraps, no LE edema or tenderness Neuro: sedated, aroudable to pain  Labs: CBC pending 143/3.5/109/25/43/0.98 GFR 59 Mag 2.2 Respiratory and blood culture: [facility] Problem list   Hypercarbia 05/12/2022 Assessment & Plan:  Acute encephalopathy secondary to respiratory failure - EEG neg Acute hypoxic and hypercarbic respiratory failure and sepsis secondary to metapneumovirus and RUL pneumonia COPD exacerbation --De-escalate to Ceftriaxone for 7 days --Continue steroids --Scheduled nebs --PAD protocol: Continuous precedex and fentanyl. PRN Versed  --on restraints for agitation --Daily SBT   Type 2 DM -Required 42 units SSI -Increase lantus to 25 -Add tube feed correction q4H -SSI  History of partial seizures -  continue keppra and seizure precautions -EEG neg -PRN Versed for breakthrough seizures  Hypertension PRN hydralazine   HFpEF SSS S/p PPM -last echo in care everywhere 2021 with EF 55% -Continue on telemetry -Hold BB, anti-hypertensive and diuretics in  low-normal pressure -Consider repeat TTE if worsening HD status  Lymphedema Compression stocking in place now  Best Practice (right click and "Reselect all SmartList Selections" daily)   Diet/type: tubefeeds DVT prophylaxis: LMWH GI prophylaxis: PPI Lines: N/A Foley:  Yes, and it is still needed Code Status:  full code Last date of multidisciplinary goals of care discussion POA at bedside yesterday 3/7; will attempt updating patient's sister who lives in [place] and speaks Spanish only  Labs   CBC: Recent Labs  Lab 05/20/2022 0726 05/25/2022 0733 05/07/2022 1539 05/12/22 0035 05/12/22 0619 05/12/22 0947 05/13/22 0519  WBC 7.2  --   --  4.2  --   --   --   NEUTROABS 5.0  --   --   --   --   --   --   HGB 12.6   < > 12.2 11.5* 12.9 12.9 12.2  HCT 43.0   < > 36.0 41.6 38.0 38.0 36.0  MCV 89.0  --   --  92.0  --   --   --   PLT 158  --   --  119*  --   --   --    < > = values in this interval not displayed.    Basic Metabolic Panel: Recent Labs  Lab 05/15/2022 0726 06/05/2022 0733 05/30/2022 0734 05/12/22 0035 05/12/22 0619 05/12/22 0947 05/12/22 1713 05/13/22 0519 05/13/22 0731  NA 141 143   < > 143 145 144  --  147* 143  K 3.9 4.0   < > 4.2 4.3 4.3  --  3.2* 3.5  CL 102 101  --  107  --   --   --   --  109  CO2 35*  --   --  30  --   --   --   --  25  GLUCOSE 201* 203*  --  185*  --   --   --   --  391*  BUN 18 20  --  25*  --   --   --   --  43*  CREATININE 0.89 0.90  --  0.94  --   --   --   --  0.98  CALCIUM 8.8*  --   --  8.7*  --   --   --   --  8.7*  MG 1.9  --   --  1.9  --   --  2.4  --  2.2  PHOS  --   --   --   --   --   --  1.1*  --  3.3   < > = values in this interval not displayed.   GFR: Estimated Creatinine Clearance: 58.4 mL/min (by C-G formula based on SCr of 0.98 mg/dL). Recent Labs  Lab 05/28/2022 0726 05/30/2022 0945 05/12/22 0035  PROCALCITON <0.10  --   --   WBC 7.2  --  4.2  LATICACIDVEN 1.0 1.0  --     Liver Function Tests: Recent Labs  Lab  05/22/2022 0726  AST 15  ALT 12  ALKPHOS 54  BILITOT 0.5  PROT 7.2  ALBUMIN 3.2*   No results for input(s): "LIPASE", "AMYLASE" in the last 168 hours. Recent Labs  Lab  05/13/2022 0726  AMMONIA 24    ABG    Component Value Date/Time   PHART 7.425 05/13/2022 0519   PCO2ART 35.5 05/13/2022 0519   PO2ART 86 05/13/2022 0519   HCO3 23.3 05/13/2022 0519   TCO2 24 05/13/2022 0519   ACIDBASEDEF 1.0 05/13/2022 0519   O2SAT 97 05/13/2022 0519     Coagulation Profile: Recent Labs  Lab  0726  INR 1.1    Cardiac Enzymes: No results for input(s): "CKTOTAL", "CKMB", "CKMBINDEX", "TROPONINI" in the last 168 hours.  HbA1C: Hgb A1c MFr Bld  Date/Time Value Ref Range Status  05/08/2022 10:57 PM 7.3 (H) 4.8 - 5.6 % Final    Comment:    (NOTE)         Prediabetes: 5.7 - 6.4         Diabetes: >6.4         Glycemic control for adults with diabetes: <7.0   09/07/2020 05:35 AM 6.8 (H) 4.8 - 5.6 % Final    Comment:    (NOTE)         Prediabetes: 5.7 - 6.4         Diabetes: >6.4         Glycemic control for adults with diabetes: <7.0     CBG: Recent Labs  Lab 05/12/22 1522 05/12/22 1936 05/12/22 2331 05/13/22 0330 05/13/22 0750  GLUCAP 200* 219* 255* 368* 352*    Review of Systems:   [id number]   Past Medical History:  She,  has a past medical history of Chronic acquired lymphedema, Chronic heart failure with preserved ejection fraction (HFpEF) ([facility]), COPD (chronic obstructive pulmonary disease) ([place]), CVA (cerebral vascular accident) ([place]), DM (diabetes mellitus) ([place]), HLD (hyperlipidemia), Hypertension, Morbid obesity with BMI of 50.0-59.9, adult ([place]), Partial seizure disorder ([place]), and SSS (sick sinus syndrome) ([place]).   Surgical History:   Past Surgical History:  Procedure Laterality Date   APPENDECTOMY     BREAST SURGERY     colectomy     rectal surgery   HERNIA REPAIR     PACEMAKER IMPLANT     TONSILLECTOMY       Social History:   reports that  she has never smoked. She has never used smokeless tobacco. She reports that she does not drink alcohol and does not use drugs.   Family History:  Her family history includes Diabetes in her sister; Heart disease in her sister.   Allergies Allergies  Allergen Reactions   Ativan [Lorazepam] Other (See Comments)    "Stopped breathing"     Home Medications  Prior to Admission medications   Medication Sig Start Date End Date Taking? Authorizing Provider  acetaminophen (TYLENOL) 325 MG tablet Take 650 mg by mouth every 4 (four) hours as needed for mild pain or moderate pain.   Yes [provider]  albuterol (VENTOLIN HFA) 108 (90 Base) MCG/ACT inhaler Inhale 2 puffs into the lungs once as needed for wheezing or shortness of breath (for Grade 3 or 4 hypersensitivity reaction). Patient taking differently: Inhale 2 puffs into the lungs every 4 (four) hours as needed for wheezing or shortness of breath. 12/14/19  Yes Shelly Coss, MD  atorvastatin (LIPITOR) 20 MG tablet Take 20 mg by mouth at bedtime. 08/22/19  Yes [provider]  bisacodyl (DULCOLAX) 10 MG suppository Place 10 mg rectally as needed for moderate constipation. If not relived by milk of mag, give '10mg'$  bisacodyl suppository rectally x 1 dose in 24 hours as needed.   Yes [provider]  carvedilol (COREG) 12.5 MG tablet Take 1 tablet (12.5 mg total) by mouth 2 (two) times daily with a meal. 09/12/20  Yes Wieting, Richard, MD  cholecalciferol (VITAMIN D3) 25 MCG (1000 UNIT) tablet Take 1,000 Units by mouth every other day.   Yes [provider]  dorzolamide-timolol (COSOPT) 22.3-6.8 MG/ML ophthalmic solution Place 1 drop into both eyes 2 (two) times daily.   Yes [provider]  furosemide (LASIX) 40 MG tablet Take 1 tablet (40 mg total) by mouth daily. 12/14/19  Yes Shelly Coss, MD  guaiFENesin (ROBITUSSIN) 100 MG/5ML liquid Take 10 mLs by mouth every 4 (four) hours as needed for cough or  to loosen phlegm.   Yes [provider]  insulin glargine (LANTUS) 100 UNIT/ML injection Inject 0.13 mLs (13 Units total) into the skin 2 (two) times daily. Patient taking differently: Inject 26 Units into the skin at bedtime. 09/15/20  Yes Wieting, Richard, MD  insulin lispro (HUMALOG) 100 UNIT/ML injection Inject 0.06 mLs (6 Units total) into the skin 3 (three) times daily with meals. Inject 10u under the skin three times daily at mealtimes (plus applicable sliding scale) - do not use if blood glucose <150 Patient taking differently: Inject 6 Units into the skin 3 (three) times daily with meals. *SLIDING SCALE* Inject 10u under the skin three times daily at mealtimes (plus applicable sliding scale) - do not use if blood glucose <150 09/12/20  Yes Wieting, Richard, MD  levETIRAcetam (KEPPRA) 750 MG tablet Take 750 mg by mouth 2 (two) times daily. 10/31/19  Yes [provider]  linagliptin (TRADJENTA) 5 MG TABS tablet Take 5 mg by mouth every morning.   Yes [provider]  loratadine (CLARITIN) 10 MG tablet Take 10 mg by mouth daily as needed for allergies.   Yes [provider]  magnesium hydroxide (MILK OF MAGNESIA) 400 MG/5ML suspension Take 30 mLs by mouth daily as needed for mild constipation. If no BM in 3 days, give 36m of milk of magnesia by mouth x 1 dose in 24 hours as needed.   Yes [provider]  nystatin (MYCOSTATIN/NYSTOP) powder Apply 1 Application topically daily. Apply topically to folds on bilateral thighs, groin and between buttocks once daily.   Yes [provider]  nystatin powder Apply 1 Application topically as needed (rash). Apply topically to groin and between buttocks as needed.   Yes [provider]  OXYGEN Inhale 2 L into the lungs as needed (maintain O2 SAT > 90%).   Yes [provider]  sacubitril-valsartan (ENTRESTO) 97-103 MG Take 1 tablet by mouth 2 (two) times daily. 09/12/20  Yes Wieting, Richard, MD   Sodium Phosphates (RA SALINE ENEMA RE) Place 1 enema rectally as needed (constipation). If not relieved by bisacodyl suppository, give disposable saline enema rectally x 1 dose/24 hours as needed.   Yes [provider]  spironolactone (ALDACTONE) 25 MG tablet Take 0.5 tablets (12.5 mg total) by mouth daily. 09/16/20  Yes Wieting, Richard, MD  vitamin B-12 (CYANOCOBALAMIN) 100 MCG tablet Take 100 mcg by mouth every other day.   Yes [provider]  vitamin C (ASCORBIC ACID) 250 MG tablet Take 250 mg by mouth every other day.   Yes [provider]  amoxicillin-clavulanate (AUGMENTIN) 875-125 MG tablet Take 1 tablet by mouth 2 (two) times daily. 05/10/22   [provider]  azithromycin (ZITHROMAX) 250 MG tablet Take 250-500 mg by mouth See admin instructions. Take '500mg'$  by mouth for 1  day and then '250mg'$  by mouth daily for 4 days. 05/10/22   [provider]  cholestyramine (QUESTRAN) 4 g packet Take 1 packet (4 g total) by mouth 2 (two) times daily for 7 days. 09/15/20 09/22/20  Loletha Grayer, MD  hydrocerin (EUCERIN) CREA Apply 1 application topically daily. Patient not taking: Reported on 04/03/2022 09/13/20   Loletha Grayer, MD  predniSONE (DELTASONE) 20 MG tablet Take 40 mg by mouth daily. 05/10/22   [provider]     Critical care time:     Romana Juniper, MD [facility] Internal Medicine Program - PGY-1 05/13/2022, 10:23 AM

## 2022-05-13 NOTE — Progress Notes (Signed)
Pharmacy Electrolyte Replacement  Recent Labs:  Recent Labs    05/13/22 0731  K 3.5  MG 2.2  PHOS 3.3  CREATININE 0.98    Low Critical Values (K </= 2.5, Phos </= 1, Mg </= 1) Present: None  MD Contacted: N/A no critical values noted   Plan: 40 Kcl mEq per tube x 1

## 2022-05-13 NOTE — Inpatient Diabetes Management (Signed)
Inpatient Diabetes Program Recommendations  AACE/ADA: New Consensus Statement on Inpatient Glycemic Control (2015)  Target Ranges:  Prepandial:   less than 140 mg/dL      Peak postprandial:   less than 180 mg/dL (1-2 hours)      Critically ill patients:  140 - 180 mg/dL    Latest Reference Range & Units 05/16/2022 22:57  Hemoglobin A1C 4.8 - 5.6 % 7.3 (H)  (H): Data is abnormally high  Latest Reference Range & Units 05/17/2022 23:09 05/12/22 03:14 05/12/22 07:22 05/12/22 11:30 05/12/22 15:22 05/12/22 19:36  Glucose-Capillary 70 - 99 mg/dL 174 (H)  3 units Novolog 193 (H)  3 units Novolog  178 (H)  3 units Novolog  10 units Semglee '@0937'$  200 (H)  3 units Novolog 200 (H)  3 units Novolog 219 (H)  5 units Novolog  (H): Data is abnormally high  Latest Reference Range & Units 05/12/22 23:31 05/13/22 03:30  Glucose-Capillary 70 - 99 mg/dL 255 (H)  8 units Novolog 368 (H)  20 units Novolog  (H): Data is abnormally high  Admit with:  Acute on chronic respiratory failure with hypoxia and hypercapnia COPD with acute exacerbation Sepsis Metapneumovirus and RUL pneumonia   History: DM, CHF, CVA  SNF DM Meds: Lantus 26 units QHS       Humalog 10 units TID with meals       Humalog SSI       Tradjenta 5 mg daily  Current Orders: Novolog Resistant Correction Scale/ SSI (0-20 units) Q4 hours     Semglee 15 units Daily    MD- Note pt getting Solumedrol 40 mg daily + Tube Feeds 50cc/hr  Note Semglee increased to 15 units Daily this AM  May consider Starting the ICU Glycemic Control order set Phase 2 IV Insulin if CBGs stay >250  If you do not wish to start IV Insulin, could try adding Novolog tube feed coverage:  Novolog 6 units Q4 hours HOLD if tube feeds HELD for any reason    --Will follow patient during hospitalization--  Wyn Quaker RN, MSN, [place] Diabetes Coordinator Inpatient Glycemic Control Team Team Pager: [phone] (8a-5p)

## 2022-05-14 DIAGNOSIS — J9621 Acute and chronic respiratory failure with hypoxia: Secondary | ICD-10-CM | POA: Diagnosis not present

## 2022-05-14 DIAGNOSIS — J9622 Acute and chronic respiratory failure with hypercapnia: Secondary | ICD-10-CM | POA: Diagnosis not present

## 2022-05-14 LAB — GLUCOSE, CAPILLARY
Glucose-Capillary: 196 mg/dL — ABNORMAL HIGH (ref 70–99)
Glucose-Capillary: 249 mg/dL — ABNORMAL HIGH (ref 70–99)
Glucose-Capillary: 255 mg/dL — ABNORMAL HIGH (ref 70–99)
Glucose-Capillary: 269 mg/dL — ABNORMAL HIGH (ref 70–99)
Glucose-Capillary: 272 mg/dL — ABNORMAL HIGH (ref 70–99)
Glucose-Capillary: 272 mg/dL — ABNORMAL HIGH (ref 70–99)

## 2022-05-14 MED ORDER — INSULIN GLARGINE-YFGN 100 UNIT/ML ~~LOC~~ SOLN
30.0000 [IU] | Freq: Every day | SUBCUTANEOUS | Status: DC
  Administered 2022-05-14 – 2022-05-17 (×3): 30 [IU] via SUBCUTANEOUS
  Filled 2022-05-14 (×6): qty 0.3

## 2022-05-14 MED ORDER — INSULIN ASPART 100 UNIT/ML IJ SOLN
8.0000 [IU] | INTRAMUSCULAR | Status: DC
  Administered 2022-05-14 – 2022-05-17 (×17): 8 [IU] via SUBCUTANEOUS

## 2022-05-14 MED ORDER — HYDRALAZINE HCL 20 MG/ML IJ SOLN
10.0000 mg | INTRAMUSCULAR | Status: DC | PRN
  Administered 2022-05-16 (×2): 10 mg via INTRAVENOUS
  Filled 2022-05-14: qty 1

## 2022-05-14 MED ORDER — CARVEDILOL 12.5 MG PO TABS: 12.5000 mg | ORAL_TABLET | Freq: Two times a day (BID) | ORAL | Status: DC

## 2022-05-14 MED ORDER — AMLODIPINE BESYLATE 10 MG PO TABS
10.0000 mg | ORAL_TABLET | Freq: Every day | ORAL | Status: DC
  Administered 2022-05-14 – 2022-05-16 (×3): 10 mg
  Filled 2022-05-14 (×3): qty 1

## 2022-05-14 NOTE — Progress Notes (Signed)
NAME:  Grace Short, MRN:  [id number], DOB:  [birth year], LOS: 3 ADMISSION DATE:  06/03/2022, CONSULTATION DATE:  05/12/2022 REFERRING MD:  Dr. Tamala Julian, CHIEF COMPLAINT:  AMS, Sepsis   History of Present Illness:  Grace Short is a 77 y.o. F with PMH of HTN, HL, CHF, COPD on 2L home O2, seizures, chronic lymphedema who was noted to be altered with labored breathing at SNF on the day of admission. EMS was called and pt was 84% on 2L.  She was brought to the ED where she was placed on non-rebreather and fever up to 101.6, she was noted to be hypercarbic on ABG so transitioned to Bipap.  She had some shaking up the upper extremities so EEG was ordered.   She underwent CT of the chest /abd/pelvis showing RUL PNA.  Labs significant for normal lactic acid, covid, flu and RSV negative, no leukocytosis, BNP and troponin WNL and procalcitonin <0.1.   She was treated with cefepime, vancomycin and flagyl and admitted to the hospital medicine service, however had repeated ABG's showing respiratory acidosis and AMS, so PCCM consulted   Pertinent  Medical History  has a past medical history of Chronic acquired lymphedema, Chronic heart failure with preserved ejection fraction (HFpEF) ([place]), COPD (chronic obstructive pulmonary disease) ([place]), CVA (cerebral vascular accident) ([place]), DM (diabetes mellitus) ([place]), HLD (hyperlipidemia), Hypertension, Morbid obesity with BMI of 50.0-59.9, adult ([place]), Partial seizure disorder ([place]), and SSS (sick sinus syndrome) ([employer]).   Significant Hospital Events: Including procedures, antibiotic start and stop dates in addition to other pertinent events   3/5 presented to the ED from snf with AMS and hypoxia 3/6 persistent respiratory acidosis, PCCM consult, on bipap 3/7 Intubated 3/9-agitation  Interim History / Subjective:  Alert agitated No overnight events Remains on sedation  Objective   Blood pressure 138/70, pulse 60, temperature 97.9 F (36.6 C), temperature source  Axillary, resp. rate (!) 22, height '5\' 2"'$  (1.575 m), weight 118.2 kg, SpO2 100 %.    Vent Mode: PRVC FiO2 (%):  [40 %] 40 % Set Rate:  [28 bmp] 28 bmp Vt Set:  [400 mL] 400 mL PEEP:  [5 cmH20] 5 cmH20 Plateau Pressure:  [17 cmH20-19 cmH20] 19 cmH20  Peak 20  Intake/Output Summary (Last 24 hours) at 05/14/2022 [id number] Last data filed at 05/14/2022 0800 Gross per 24 hour  Intake 2203.09 ml  Output 575 ml  Net 1628.09 ml   Filed Weights   05/10/2022 0644 05/13/22 0500 05/14/22 0355  Weight: 130.9 kg 117.1 kg 118.2 kg   Examination: General: Chronically ill-appearing HENT: Moist oral mucosa, endotracheal tube in place Eyes: No jaundice Respiratory: Diminished air movement bilaterally Cardiovascular: No JVD, S1-S2 appreciated GI: Soft, nontender Extremities: Chronic lymphedema with wraps Neuro: Agitation, moving all extremities  Labs: Respiratory and blood culture-no growth Sodium 143, potassium 3.5, BUN 43, creatinine 0.98, magnesium 2.3, phosphorus 2.7 White count 8.8  Resolved Hospital Problem list   Hypercarbia 05/12/2022 Assessment & Plan:   Acute encephalopathy secondary to respiratory failure Acute hypoxic and hypercapnic respiratory failure Right upper lobe pneumonia Metapneumovirus infection -Continue ceftriaxone -Continue steroids -Continue bronchodilator treatments via nebulization -Pain and agitation management with Precedex and fentanyl -Requiring restraints for agitation -Daily SBT  History of partial seizures -Continue Keppra -As needed Versed for seizure break breakthrough  Type 2 diabetes -Lantus 25 -Continue SSI -Tube feed correction  Hypertension -As needed hydralazine  Heart failure with preserved ejection fraction -Will consider echo when more stable -Beta-blockers on hold  Lymphedema -  Continue wraps  Will continue daily assessments Still with significant secretions-this may be a barrier to being able to successfully extubate  Best Practice  (right click and "Reselect all SmartList Selections" daily)   Diet/type: tubefeeds DVT prophylaxis: LMWH GI prophylaxis: PPI Lines: N/A Foley:  Yes, and it is still needed Code Status:  full code Last date of multidisciplinary goals of care discussion POA at bedside yesterday 3/7  Labs   CBC: Recent Labs  Lab 05/31/2022 0726  0733 05/12/22 0035 05/12/22 0619 05/12/22 0947 05/13/22 0519 05/13/22 1048  WBC 7.2  --  4.2  --   --   --  8.8  NEUTROABS 5.0  --   --   --   --   --   --   HGB 12.6   < > 11.5* 12.9 12.9 12.2 11.8*  HCT 43.0   < > 41.6 38.0 38.0 36.0 37.0  MCV 89.0  --  92.0  --   --   --  82.2  PLT 158  --  119*  --   --   --  128*   < > = values in this interval not displayed.    Basic Metabolic Panel: Recent Labs  Lab 05/26/2022 0726 06/02/2022 0733 05/20/2022 0734 05/12/22 0035 05/12/22 0619 05/12/22 0947 05/12/22 1713 05/13/22 0519 05/13/22 0731 05/13/22 1633  NA 141 143   < > 143 145 144  --  147* 143  --   K 3.9 4.0   < > 4.2 4.3 4.3  --  3.2* 3.5  --   CL 102 101  --  107  --   --   --   --  109  --   CO2 35*  --   --  30  --   --   --   --  25  --   GLUCOSE 201* 203*  --  185*  --   --   --   --  391*  --   BUN 18 20  --  25*  --   --   --   --  43*  --   CREATININE 0.89 0.90  --  0.94  --   --   --   --  0.98  --   CALCIUM 8.8*  --   --  8.7*  --   --   --   --  8.7*  --   MG 1.9  --   --  1.9  --   --  2.4  --  2.2 2.3  PHOS  --   --   --   --   --   --  1.1*  --  3.3 2.7   < > = values in this interval not displayed.   GFR: Estimated Creatinine Clearance: 58.7 mL/min (by C-G formula based on SCr of 0.98 mg/dL). Recent Labs  Lab 05/27/2022 0726 05/15/2022 0945 05/12/22 0035 05/13/22 1048  PROCALCITON <0.10  --   --   --   WBC 7.2  --  4.2 8.8  LATICACIDVEN 1.0 1.0  --   --     Liver Function Tests: Recent Labs  Lab 06/01/2022 0726  AST 15  ALT 12  ALKPHOS 54  BILITOT 0.5  PROT 7.2  ALBUMIN 3.2*   No results for input(s):  "LIPASE", "AMYLASE" in the last 168 hours. Recent Labs  Lab 05/07/2022 0726  AMMONIA 24    ABG    Component Value Date/Time  PHART 7.425 05/13/2022 0519   PCO2ART 35.5 05/13/2022 0519   PO2ART 86 05/13/2022 0519   HCO3 23.3 05/13/2022 0519   TCO2 24 05/13/2022 0519   ACIDBASEDEF 1.0 05/13/2022 0519   O2SAT 97 05/13/2022 0519     Coagulation Profile: Recent Labs  Lab 05/08/2022 0726  INR 1.1    Cardiac Enzymes: No results for input(s): "CKTOTAL", "CKMB", "CKMBINDEX", "TROPONINI" in the last 168 hours.  HbA1C: Hgb A1c MFr Bld  Date/Time Value Ref Range Status  05/12/2022 10:57 PM 7.3 (H) 4.8 - 5.6 % Final    Comment:    (NOTE)         Prediabetes: 5.7 - 6.4         Diabetes: >6.4         Glycemic control for adults with diabetes: <7.0   09/07/2020 05:35 AM 6.8 (H) 4.8 - 5.6 % Final    Comment:    (NOTE)         Prediabetes: 5.7 - 6.4         Diabetes: >6.4         Glycemic control for adults with diabetes: <7.0     CBG: Recent Labs  Lab 05/13/22 1529 05/13/22 1952 05/13/22 2330 05/14/22 0346 05/14/22 0730  GLUCAP 253* 160* 179* 249* 255*    Review of Systems:   [id number]   Past Medical History:  She,  has a past medical history of Chronic acquired lymphedema, Chronic heart failure with preserved ejection fraction (HFpEF) (HCC), COPD (chronic obstructive pulmonary disease) ([place]), CVA (cerebral vascular accident) ([place]), DM (diabetes mellitus) ([place]), HLD (hyperlipidemia), Hypertension, Morbid obesity with BMI of 50.0-59.9, adult ([place]), Partial seizure disorder ([place]), and SSS (sick sinus syndrome) ([place]).   Surgical History:   Past Surgical History:  Procedure Laterality Date   APPENDECTOMY     BREAST SURGERY     colectomy     rectal surgery   HERNIA REPAIR     PACEMAKER IMPLANT     TONSILLECTOMY       Social History:   reports that she has never smoked. She has never used smokeless tobacco. She reports that she does not drink alcohol and does  not use drugs.   Family History:  Her family history includes Diabetes in her sister; Heart disease in her sister.   Allergies Allergies  Allergen Reactions   Ativan [Lorazepam] Other (See Comments)    "Stopped breathing"    The patient is critically ill with multiple organ systems failure and requires high complexity decision making for assessment and support, frequent evaluation and titration of therapies, application of advanced monitoring technologies and extensive interpretation of multiple databases. Critical Care Time devoted to patient care services described in this note independent of APP/resident time (if applicable)  is 32 minutes.   Sherrilyn Rist MD [facility] Personal pager: See Amion If unanswered, please page CCM On-call: [phone]

## 2022-05-14 NOTE — Progress Notes (Signed)
RT NOTE: attempted SBT on patient this AM on CPAP/PSV of 15/5 however patient's minute ventilation were only 4L and VTs were in low 300s.  Placed patient back on full support ventilator settings and is tolerating well at this time.  Will continue to monitor.

## 2022-05-14 NOTE — Progress Notes (Signed)
Blood pressure remains high  Add Norvasc per tube Change hydralazine as needed to every 4 as needed for systolic greater than [phone]

## 2022-05-14 NOTE — Progress Notes (Signed)
eLink Physician-Brief Progress Note Patient Name: Grace Short. Rusche DOB: [birth year] MRN: [id number]   Date of Service  05/14/2022  HPI/Events of Note  Hyperglycemia - Blood glucose = 249. Currently on SemGlee 25 units [place] Q 12, Q 4 hour resistant Novolog SSI and Q 4 hour 4 units Novolog tube feeding coverage.  eICU Interventions  Plan: Increase SemGlee to 30 units [place] Q 12 hours.      Intervention Category Major Interventions: Hyperglycemia - active titration of insulin therapy  Jaquel Coomer Cornelia Copa 05/14/2022, 4:42 AM

## 2022-05-15 ENCOUNTER — Inpatient Hospital Stay (HOSPITAL_COMMUNITY): Payer: 59

## 2022-05-15 DIAGNOSIS — J9621 Acute and chronic respiratory failure with hypoxia: Secondary | ICD-10-CM | POA: Diagnosis not present

## 2022-05-15 DIAGNOSIS — J9622 Acute and chronic respiratory failure with hypercapnia: Secondary | ICD-10-CM | POA: Diagnosis not present

## 2022-05-15 LAB — BASIC METABOLIC PANEL
Anion gap: 9 (ref 5–15)
BUN: 51 mg/dL — ABNORMAL HIGH (ref 8–23)
CO2: 24 mmol/L (ref 22–32)
Calcium: 8.9 mg/dL (ref 8.9–10.3)
Chloride: 119 mmol/L — ABNORMAL HIGH (ref 98–111)
Creatinine, Ser: 0.62 mg/dL (ref 0.44–1.00)
GFR, Estimated: >60 mL/min (ref >60)
Glucose, Bld: 178 mg/dL — ABNORMAL HIGH (ref 70–99)
Potassium: 3.7 mmol/L (ref 3.5–5.1)
Sodium: 152 mmol/L — ABNORMAL HIGH (ref 135–145)

## 2022-05-15 LAB — GLUCOSE, CAPILLARY
Glucose-Capillary: 143 mg/dL — ABNORMAL HIGH (ref 70–99)
Glucose-Capillary: 127 mg/dL — ABNORMAL HIGH (ref 70–99)
Glucose-Capillary: 158 mg/dL — ABNORMAL HIGH (ref 70–99)
Glucose-Capillary: 213 mg/dL — ABNORMAL HIGH (ref 70–99)
Glucose-Capillary: 180 mg/dL — ABNORMAL HIGH (ref 70–99)
Glucose-Capillary: 210 mg/dL — ABNORMAL HIGH (ref 70–99)

## 2022-05-15 LAB — PHOSPHORUS: Phosphorus: 2.3 mg/dL — ABNORMAL LOW (ref 2.5–4.6)

## 2022-05-15 LAB — MAGNESIUM: Magnesium: 2.2 mg/dL (ref 1.7–2.4)

## 2022-05-15 LAB — CBC
HCT: 37.5 % (ref 36.0–46.0)
Hemoglobin: 11.5 g/dL — ABNORMAL LOW (ref 12.0–15.0)
MCH: 25.4 pg — ABNORMAL LOW (ref 26.0–34.0)
MCHC: 30.7 g/dL (ref 30.0–36.0)
MCV: 83 fL (ref 80.0–100.0)
Platelets: 127 10*3/uL — ABNORMAL LOW (ref 150–400)
RBC: 4.52 MIL/uL (ref 3.87–5.11)
RDW: 16.3 % — ABNORMAL HIGH (ref 11.5–15.5)
WBC: 8 10*3/uL (ref 4.0–10.5)
nRBC: 0 % (ref 0.0–0.2)

## 2022-05-15 MED ORDER — POTASSIUM CHLORIDE 20 MEQ PO PACK
20.0000 meq | PACK | Freq: Once | ORAL | Status: AC
  Administered 2022-05-15: 20 meq
  Filled 2022-05-15: qty 1

## 2022-05-15 MED ORDER — POTASSIUM PHOSPHATES 15 MMOLE/5ML IV SOLN
15.0000 mmol | Freq: Once | INTRAVENOUS | Status: AC
  Administered 2022-05-15: 15 mmol via INTRAVENOUS
  Filled 2022-05-15: qty 5

## 2022-05-15 MED ORDER — FREE WATER
200.0000 mL | Status: DC
  Administered 2022-05-15 – 2022-05-18 (×17): 200 mL

## 2022-05-15 NOTE — Consult Note (Signed)
WOC Nurse Consult Note: Reason for Consult:Consult received for management of bilateral LE lymphedema without wounds Wound type:venous insufficiency, lymphedema  Pressure Injury POA:N/A Measurement:N/A Wound bed:N/A Drainage (amount, consistency, odor) N/A Periwound:N/A Dressing procedure/placement/frequency: Orders placed for management of lymphedema while in house with Unna's Boot application twice weekly by Ortho Tech. Boots to be placed today and changed Thursday, then Mondays and Thursdays thereafter. Heels are to be floated using Prevalon boots and a sacral foam placed to the sacrum for PI prevention.  [place] nursing team will not follow, but will remain available to this patient, the nursing and medical teams.  Please re-consult if needed.  Thank you for inviting [place] to participate in this patient's Plan of Care.  Maudie Flakes, MSN, RN, CNS, [place], Serita Grammes, [employer], [employer] phone:  [phone]

## 2022-05-15 NOTE — Progress Notes (Signed)
[facility] ADULT ICU REPLACEMENT PROTOCOL   The patient does apply for the [facility] Adult ICU Electrolyte Replacment Protocol based on the criteria listed below:   1.Exclusion criteria: TCTS, ECMO, Dialysis, and Myasthenia Gravis patients 2. Is GFR >/= 30 ml/min? Yes.    Patient's GFR today is >60 3. Is SCr </= 2? Yes.   Patient's SCr is 0.62 mg/dL 4. Did SCr increase >/= 0.5 in 24 hours? No. 5.Pt's weight >40kg  Yes.   6. Abnormal electrolyte(s): Phos, K  7. Electrolytes replaced per protocol 8.  Call MD STAT for K+ </= 2.5, Phos </= 1, or Mag </= 1 Physician:  Olean Ree [facility] 05/15/2022 6:22 AM

## 2022-05-15 NOTE — Progress Notes (Signed)
NAME:  Grace Short, MRN:  [id number], DOB:  Mar 25, 1945, LOS: 4 ADMISSION DATE:  05/12/2022, CONSULTATION DATE:  05/12/2022 REFERRING MD:  Dr. Tamala Julian, CHIEF COMPLAINT:  AMS, Sepsis   History of Present Illness:  Grace Short is a 77 y.o. F with PMH of HTN, HL, CHF, COPD on 2L home O2, seizures, chronic lymphedema who was noted to be altered with labored breathing at SNF on the day of admission. EMS was called and pt was 84% on 2L.  She was brought to the ED where she was placed on non-rebreather and fever up to 101.6, she was noted to be hypercarbic on ABG so transitioned to Bipap.  She had some shaking up the upper extremities so EEG was ordered.   She underwent CT of the chest /abd/pelvis showing RUL PNA.  Labs significant for normal lactic acid, covid, flu and RSV negative, no leukocytosis, BNP and troponin WNL and procalcitonin <0.1.   She was treated with cefepime, vancomycin and flagyl and admitted to the hospital medicine service, however had repeated ABG's showing respiratory acidosis and AMS, so PCCM consulted   Pertinent  Medical History  has a past medical history of Chronic acquired lymphedema, Chronic heart failure with preserved ejection fraction (HFpEF) ([place]), COPD (chronic obstructive pulmonary disease) ([place]), CVA (cerebral vascular accident) ([place]), DM (diabetes mellitus) ([place]), HLD (hyperlipidemia), Hypertension, Morbid obesity with BMI of 50.0-59.9, adult ([place]), Partial seizure disorder ([place]), and SSS (sick sinus syndrome) ([place]).   Significant Hospital Events: Including procedures, antibiotic start and stop dates in addition to other pertinent events   3/5 presented to the ED from snf with AMS and hypoxia 3/6 persistent respiratory acidosis, PCCM consult, on bipap 3/7 Intubated 3/9-agitation 3/10-remains agitated  Interim History / Subjective:  Alert agitated No overnight events Was on minimal support,-ventilator wise, still with some very significant secretions  Objective    Blood pressure 134/64, pulse (!) 101, temperature 99 F (37.2 C), resp. rate 18, height '5\' 2"'$  (1.575 m), weight 118.8 kg, SpO2 100 %.    Vent Mode: CPAP;PSV FiO2 (%):  [30 %-40 %] 30 % Set Rate:  [16 bmp-28 bmp] 16 bmp Vt Set:  [400 mL] 400 mL PEEP:  [5 cmH20] 5 cmH20 Pressure Support:  [5 cmH20] 5 cmH20 Plateau Pressure:  [18 cmH20-27 cmH20] 27 cmH20  Peak 20  Intake/Output Summary (Last 24 hours) at 05/15/2022 0849 Last data filed at 05/15/2022 0800 Gross per 24 hour  Intake 2172.46 ml  Output 1100 ml  Net 1072.46 ml   Filed Weights   05/13/22 0500 05/14/22 0355 05/15/22 0500  Weight: 117.1 kg 118.2 kg 118.8 kg   Examination: General: Chronically ill-appearing HENT: Moist oral mucosa, endotracheal tube in place Eyes: No jaundice Respiratory: Does have some rhonchi Cardiovascular: S1-S2 appreciated GI: Soft, bowel sounds appreciated Extremities: Chronic lymphedema with wraps in place Neuro: Agitation, moving all extremities  Labs: Respiratory and blood cultures-no growth Sodium 152 White count 8.0  Resolved Hospital Problem list   Hypercarbia 05/12/2022 Assessment & Plan:   Acute encephalopathy secondary to respiratory failure Acute hypoxemic and hypercapnic respiratory failure Right upper lobe pneumonia Metapneumovirus infection cultures still not showing anything -Will continue steroids -New ceftriaxone after today's dose-completing 5 days of antibiotic therapy -Continue bronchodilator treatments -Pain and agitation management with Precedex and fentanyl -She is requiring restraints for agitation -On minimal vent support however, with significant secretions and significant difficulty with agitation, it is safer to continue current management, wean as tolerated anticipate will be able to  extubate once secretions improve  History of partial seizures -On Keppra  Type 2 diabetes -On Lantus -SSI  Hypertension -Started on Norvasc -Blood pressure is  better  Heart failure with preserved ejection fraction -Plan for echo when more stable  Lymphedema -Continue wrapping  Best Practice (right click and "Reselect all SmartList Selections" daily)   Diet/type: tubefeeds DVT prophylaxis: LMWH GI prophylaxis: PPI Lines: N/A Foley:  Yes, and it is still needed Code Status:  full code Last date of multidisciplinary goals of care discussion POA at bedside yesterday 3/7  Labs   CBC: Recent Labs  Lab 05/20/2022 0726 05/10/2022 0733 05/12/22 0035 05/12/22 0619 05/12/22 0947 05/13/22 0519 05/13/22 1048 05/15/22 0128  WBC 7.2  --  4.2  --   --   --  8.8 8.0  NEUTROABS 5.0  --   --   --   --   --   --   --   HGB 12.6   < > 11.5* 12.9 12.9 12.2 11.8* 11.5*  HCT 43.0   < > 41.6 38.0 38.0 36.0 37.0 37.5  MCV 89.0  --  92.0  --   --   --  82.2 83.0  PLT 158  --  119*  --   --   --  128* 127*   < > = values in this interval not displayed.    Basic Metabolic Panel: Recent Labs  Lab 05/26/2022 0726 05/08/2022 0733 05/13/2022 0734 05/12/22 0035 05/12/22 0619 05/12/22 0947 05/12/22 1713 05/13/22 0519 05/13/22 0731 05/13/22 1633 05/15/22 0128  NA 141 143   < > 143 145 144  --  147* 143  --  152*  K 3.9 4.0   < > 4.2 4.3 4.3  --  3.2* 3.5  --  3.7  CL 102 101  --  107  --   --   --   --  109  --  119*  CO2 35*  --   --  30  --   --   --   --  25  --  24  GLUCOSE 201* 203*  --  185*  --   --   --   --  391*  --  178*  BUN 18 20  --  25*  --   --   --   --  43*  --  51*  CREATININE 0.89 0.90  --  0.94  --   --   --   --  0.98  --  0.62  CALCIUM 8.8*  --   --  8.7*  --   --   --   --  8.7*  --  8.9  MG 1.9  --   --  1.9  --   --  2.4  --  2.2 2.3 2.2  PHOS  --   --   --   --   --   --  1.1*  --  3.3 2.7 2.3*   < > = values in this interval not displayed.   GFR: Estimated Creatinine Clearance: 72.1 mL/min (by C-G formula based on SCr of 0.62 mg/dL). Recent Labs  Lab 05/29/2022 0726 05/27/2022 0945 05/12/22 0035 05/13/22 1048  05/15/22 0128  PROCALCITON <0.10  --   --   --   --   WBC 7.2  --  4.2 8.8 8.0  LATICACIDVEN 1.0 1.0  --   --   --     Liver Function Tests: Recent Labs  Lab  05/15/2022 0726  AST 15  ALT 12  ALKPHOS 54  BILITOT 0.5  PROT 7.2  ALBUMIN 3.2*   No results for input(s): "LIPASE", "AMYLASE" in the last 168 hours. Recent Labs  Lab 06/03/2022 0726  AMMONIA 24    ABG    Component Value Date/Time   PHART 7.425 05/13/2022 0519   PCO2ART 35.5 05/13/2022 0519   PO2ART 86 05/13/2022 0519   HCO3 23.3 05/13/2022 0519   TCO2 24 05/13/2022 0519   ACIDBASEDEF 1.0 05/13/2022 0519   O2SAT 97 05/13/2022 0519     Coagulation Profile: Recent Labs  Lab 05/24/2022 0726  INR 1.1    Cardiac Enzymes: No results for input(s): "CKTOTAL", "CKMB", "CKMBINDEX", "TROPONINI" in the last 168 hours.  HbA1C: Hgb A1c MFr Bld  Date/Time Value Ref Range Status  05/24/2022 10:57 PM 7.3 (H) 4.8 - 5.6 % Final    Comment:    (NOTE)         Prediabetes: 5.7 - 6.4         Diabetes: >6.4         Glycemic control for adults with diabetes: <7.0   09/07/2020 05:35 AM 6.8 (H) 4.8 - 5.6 % Final    Comment:    (NOTE)         Prediabetes: 5.7 - 6.4         Diabetes: >6.4         Glycemic control for adults with diabetes: <7.0     CBG: Recent Labs  Lab 05/14/22 1527 05/14/22 1927 05/14/22 2330 05/15/22 0333 05/15/22 0758  GLUCAP 272* 269* 196* 143* 127*    Review of Systems:     Past Medical History:  She,  has a past medical history of Chronic acquired lymphedema, Chronic heart failure with preserved ejection fraction (HFpEF) (HCC), COPD (chronic obstructive pulmonary disease) ([place]), CVA (cerebral vascular accident) ([place]), DM (diabetes mellitus) ([place]), HLD (hyperlipidemia), Hypertension, Morbid obesity with BMI of 50.0-59.9, adult ([place]), Partial seizure disorder ([place]), and SSS (sick sinus syndrome) ([place]).   Surgical History:   Past Surgical History:  Procedure Laterality Date   APPENDECTOMY      BREAST SURGERY     colectomy     rectal surgery   HERNIA REPAIR     PACEMAKER IMPLANT     TONSILLECTOMY       Social History:   reports that she has never smoked. She has never used smokeless tobacco. She reports that she does not drink alcohol and does not use drugs.   Family History:  Her family history includes Diabetes in her sister; Heart disease in her sister.   Allergies Allergies  Allergen Reactions   Ativan [Lorazepam] Other (See Comments)    "Stopped breathing"    The patient is critically ill with multiple organ systems failure and requires high complexity decision making for assessment and support, frequent evaluation and titration of therapies, application of advanced monitoring technologies and extensive interpretation of multiple databases. Critical Care Time devoted to patient care services described in this note independent of APP/resident time (if applicable)  is 32 minutes.   Sherrilyn Rist MD [place] [facility] Personal pager: See Amion If unanswered, please page CCM On-call: [phone]

## 2022-05-16 ENCOUNTER — Inpatient Hospital Stay (HOSPITAL_COMMUNITY): Payer: 59

## 2022-05-16 DIAGNOSIS — J9621 Acute and chronic respiratory failure with hypoxia: Secondary | ICD-10-CM | POA: Diagnosis not present

## 2022-05-16 LAB — CULTURE, BLOOD (ROUTINE X 2)
Culture: NO GROWTH
Culture: NO GROWTH
Special Requests: ADEQUATE
Special Requests: ADEQUATE

## 2022-05-16 LAB — BASIC METABOLIC PANEL
Anion gap: 9 (ref 5–15)
BUN: 43 mg/dL — ABNORMAL HIGH (ref 8–23)
CO2: 25 mmol/L (ref 22–32)
Calcium: 8.8 mg/dL — ABNORMAL LOW (ref 8.9–10.3)
Chloride: 115 mmol/L — ABNORMAL HIGH (ref 98–111)
Creatinine, Ser: 0.62 mg/dL (ref 0.44–1.00)
GFR, Estimated: >60 mL/min (ref >60)
Glucose, Bld: 177 mg/dL — ABNORMAL HIGH (ref 70–99)
Potassium: 4.4 mmol/L (ref 3.5–5.1)
Sodium: 149 mmol/L — ABNORMAL HIGH (ref 135–145)

## 2022-05-16 LAB — CBC
HCT: 38.1 % (ref 36.0–46.0)
Hemoglobin: 11.4 g/dL — ABNORMAL LOW (ref 12.0–15.0)
MCH: 25.7 pg — ABNORMAL LOW (ref 26.0–34.0)
MCHC: 29.9 g/dL — ABNORMAL LOW (ref 30.0–36.0)
MCV: 85.8 fL (ref 80.0–100.0)
Platelets: 129 10*3/uL — ABNORMAL LOW (ref 150–400)
RBC: 4.44 MIL/uL (ref 3.87–5.11)
RDW: 16.5 % — ABNORMAL HIGH (ref 11.5–15.5)
WBC: 7.9 10*3/uL (ref 4.0–10.5)
nRBC: 0 % (ref 0.0–0.2)

## 2022-05-16 LAB — GLUCOSE, CAPILLARY
Glucose-Capillary: 163 mg/dL — ABNORMAL HIGH (ref 70–99)
Glucose-Capillary: 114 mg/dL — ABNORMAL HIGH (ref 70–99)
Glucose-Capillary: 124 mg/dL — ABNORMAL HIGH (ref 70–99)
Glucose-Capillary: 97 mg/dL (ref 70–99)
Glucose-Capillary: 163 mg/dL — ABNORMAL HIGH (ref 70–99)
Glucose-Capillary: 167 mg/dL — ABNORMAL HIGH (ref 70–99)

## 2022-05-16 LAB — PHOSPHORUS: Phosphorus: 2.8 mg/dL (ref 2.5–4.6)

## 2022-05-16 LAB — MAGNESIUM: Magnesium: 2 mg/dL (ref 1.7–2.4)

## 2022-05-16 MED ORDER — ACETAMINOPHEN 650 MG RE SUPP: 650.0000 mg | Freq: Four times a day (QID) | RECTAL | Status: DC | PRN

## 2022-05-16 MED ORDER — VITAL AF 1.2 CAL PO LIQD
1000.0000 mL | ORAL | Status: DC
  Administered 2022-05-16 – 2022-05-17 (×2): 1000 mL

## 2022-05-16 MED ORDER — ACETAMINOPHEN 325 MG PO TABS
650.0000 mg | ORAL_TABLET | Freq: Four times a day (QID) | ORAL | Status: DC | PRN
  Administered 2022-05-18: 650 mg
  Filled 2022-05-16 (×2): qty 2

## 2022-05-16 MED ORDER — BANATROL TF EN LIQD
60.0000 mL | Freq: Two times a day (BID) | ENTERAL | Status: DC
  Administered 2022-05-16 – 2022-05-17 (×4): 60 mL
  Filled 2022-05-16 (×4): qty 60

## 2022-05-16 NOTE — Procedures (Signed)
Extubation Procedure Note  Patient Details:   Name: Grace Short. Eade DOB: [birth year] MRN: [id number]   Airway Documentation:    Vent end date: 05/16/22 Vent end time: 1125   Evaluation  O2 sats: stable throughout Complications: No apparent complications Patient did tolerate procedure well. Bilateral Breath Sounds: Expiratory wheezes, Rhonchi   Yes  Patient was extubated to [place] per MD order.  Positive cuff leak noted.  Patient able to speak post extubation.  Sats and vitals are currently stable.  No stridor noted however RT did not that patient had upper expiratory wheeze in her neck.  Patient given scheduled 1200 nebulizer treatment and RT performed CPT through the bed to attempt to help patient clear her secretions.  Will continue to monitor.   Judith Part 05/16/2022, 11:39 AM

## 2022-05-16 NOTE — Progress Notes (Addendum)
Nutrition Follow-up  DOCUMENTATION CODES:   Morbid obesity  INTERVENTION:  Recommend placement of cortrak tube and to adjust TF regimen to the following: - Vital AF at 65m/h (1.32L/d) - Prosource TF 1x/d - Free water 2048mq4h per MD - This will provide 1664kcal, 119g of protein, and 107153mf free water (2271m68m+flush)  Banatrol BID for loose stools  NUTRITION DIAGNOSIS:  Inadequate oral intake related to inability to eat as evidenced by NPO status. - remains applicable  GOAL:  Provide needs based on ASPEN/SCCM guidelines - TF infusing at goal  MONITOR:  TF tolerance  REASON FOR ASSESSMENT:  Consult, Ventilator Enteral/tube feeding initiation and management  ASSESSMENT:   77 y16. female admits related to AMS and SOB. PMH includes: chronic acquired lymphedema, HFpEF, COPD, CVA, DM, HLD, HTN, SSS. Pt is currently receiving medical management related to chronic respiratory failure with hypoxia and hypercapnia.  3/7 - intubation 3/11 - extubation, cortrak tube to be placed  Pt resting in bed at the time of visit. Currently intubated, but RT and RN entered room during visit to extubate. Discussed with RN that if pt was unable to swallow and continued TF was needed, will be adjusting formula to one with more kcal.   Addendum: Messaged RN to see how pt was doing after extubation. Reports that pt has a lot of secretions and does not feel confident she would pass a swallow evaluation. Discussed with MD, will place ccortrak tube to re-start nutrition and give reliable access for PO meds until pt can safely swallow.    Intake/Output Summary (Last 24 hours) at 05/16/2022 1432 Last data filed at 05/16/2022 1200 Gross per 24 hour  Intake 3326.71 ml  Output 690 ml  Net 2636.71 ml  Net IO Since Admission: 6,770.06 mL [05/16/22 1432]   Nutritionally Relevant Medications: Scheduled Meds:  docusate  100 mg Per Tube BID   feeding supplement (PROSource TF20)  60 mL Per Tube Daily    free water  200 mL Per Tube Q4H   insulin aspart  0-20 Units Subcutaneous Q4H   insulin aspart  8 Units Subcutaneous Q4H   insulin glargine-yfgn  30 Units Subcutaneous Daily   methylPREDNISolone injection  40 mg Intravenous Daily   pantoprazole IV  40 mg Intravenous Q24H   polyethylene glycol  17 g Per Tube Daily   Continuous Infusions:  dexmedetomidine (PRECEDEX) IV infusion 0.9 mcg/kg/hr (05/16/22 0800)   feeding supplement (VITAL HIGH PROTEIN) 50 mL/hr at 05/16/22 0800   Labs Reviewed: Na 149, chloride 115 BUN 43 CBG ranges from 114-213 mg/dL over the last 24 hours  NUTRITION - FOCUSED PHYSICAL EXAM: Flowsheet Row Most Recent Value  Orbital Region No depletion  Upper Arm Region No depletion  Thoracic and Lumbar Region No depletion  Buccal Region No depletion  Temple Region No depletion  Clavicle Bone Region No depletion  Clavicle and Acromion Bone Region No depletion  Scapular Bone Region No depletion  Dorsal Hand No depletion  Patellar Region No depletion  Anterior Thigh Region No depletion  Posterior Calf Region No depletion  Edema (RD Assessment) Mild  Hair Reviewed  Eyes Unable to assess  Mouth Unable to assess  Skin Reviewed  Nails Reviewed    Diet Order:   Diet Order             Diet NPO time specified  Diet effective now                   EDUCATION NEEDS:  Not appropriate for education at this time  Skin:  Skin Assessment: Reviewed RN Assessment Skin tear to the medial buttocks (3 cm x 2 cm)  Last BM:  3/10 - type 7  Height:   Ht Readings from Last 1 Encounters:  05/12/22 '5\' 2"'$  (1.575 m)    Weight:   Wt Readings from Last 1 Encounters:  05/16/22 120.4 kg    Ideal Body Weight:  50 kg  BMI:  Body mass index is 48.55 kg/m.  Estimated Nutritional Needs:  Kcal:  1700-1900 kcal//d Protein:  100-125 gm Fluid:  1.5L/d    Ranell Patrick, RD, LDN Clinical Dietitian RD pager # available in [place]  After hours/weekend pager #  available in [facility]

## 2022-05-16 NOTE — Progress Notes (Signed)
NAME:  Grace Short, MRN:  [id number], DOB:  24-Jan-1946, LOS: 5 ADMISSION DATE:  05/14/2022, CONSULTATION DATE:  05/12/2022 REFERRING MD:  Dr. Tamala Julian, CHIEF COMPLAINT:  AMS, Sepsis   History of Present Illness:  Grace Short is a 77 y.o. F with PMH of HTN, HL, CHF, COPD on 2L home O2, seizures, chronic lymphedema who was noted to be altered with labored breathing at SNF on the day of admission. EMS was called and pt was 84% on 2L.  She was brought to the ED where she was placed on non-rebreather and fever up to 101.6, she was noted to be hypercarbic on ABG so transitioned to Bipap.  She had some shaking up the upper extremities so EEG was ordered. She underwent CT of the chest /abd/pelvis showing RUL PNA.  Labs significant for normal lactic acid, covid, flu and RSV negative, no leukocytosis, BNP and troponin WNL and procalcitonin <0.1.   She was treated with cefepime, vancomycin and flagyl and admitted to the hospital medicine service, however had repeated ABG's showing respiratory acidosis and AMS, so PCCM consulted   Pertinent  Medical History  has a past medical history of Chronic acquired lymphedema, Chronic heart failure with preserved ejection fraction (HFpEF) ([place]), COPD (chronic obstructive pulmonary disease) ([place]), CVA (cerebral vascular accident) ([place]), DM (diabetes mellitus) ([place]), HLD (hyperlipidemia), Hypertension, Morbid obesity with BMI of 50.0-59.9, adult ([place]), Partial seizure disorder ([place]), and SSS (sick sinus syndrome) ([place]).   Significant Hospital Events: Including procedures, antibiotic start and stop dates in addition to other pertinent events   3/5 presented to the ED from snf with AMS and hypoxia 3/6 persistent respiratory acidosis, PCCM consult, on bipap 3/7 Intubated 3/9-agitation 3/10-remains agitated  Interim History / Subjective:  No acute events, continues to have moderate secretions.   Objective   Blood pressure 139/63, pulse 72, temperature 98.7 F (37.1 C),  temperature source Axillary, resp. rate 18, height '5\' 2"'$  (1.575 m), weight 120.4 kg, SpO2 100 %.    Vent Mode: PRVC FiO2 (%):  [30 %] 30 % Set Rate:  [16 bmp] 16 bmp Vt Set:  [400 mL] 400 mL PEEP:  [5 cmH20] 5 cmH20 Pressure Support:  [8 cmH20] 8 cmH20 Plateau Pressure:  [24 cmH20] 24 cmH20  Peak 20  Intake/Output Summary (Last 24 hours) at 05/16/2022 0811 Last data filed at 05/16/2022 0800 Gross per 24 hour  Intake 3899.85 ml  Output 940 ml  Net 2959.85 ml    Filed Weights   05/14/22 0355 05/15/22 0500 05/16/22 0404  Weight: 118.2 kg 118.8 kg 120.4 kg   Examination: General: Chronically ill-appearing, no acute distress HENT: NCAT. Moist mucous membranes, ET in place Respiratory: Mild rhonchus breath sounds bilaterally.  Cardiovascular: Regular rate, rhythm. No murmurs. Warm extremities.  GI: Soft, non-distended. Bowel sounds appreciated Extremities: Chronic lymphedema with wraps in place Neuro: Awake, following commands  Resolved Hospital Problem list   Hypercarbia 05/12/2022  Assessment & Plan:  #Acute encephalopathy secondary to respiratory failure #Acute hypoxemic and hypercapnic respiratory failure #Right upper lobe pneumonia #Metapneumovirus infection Still having secretions, but seems to be improving. Doing well on pressure support only this morning. Hopeful for extubation later today.  -Will continue daily steroids -Continue bronchodilator treatments -Sedation weaned down this morning, restraints still required and in place - Consider extubation later today if continues to do well on SBT  #Type 2 diabetes Getting tube feeds, sugars appear well-controlled.  -On Lantus -SSI  #Hypernatremia 152>149, continue free water flushes  #Hypertension Blood  pressure at goal on amlodipine.  #Heart failure with preserved ejection fraction No acute changes, holding home medications for now. Respiratory status and renal function stable.   #Lymphedema -Continue Unna  boots w/ twice twice weekly changing -Per WOC  #History of partial seizures - On Keppra  Best Practice (right click and "Reselect all SmartList Selections" daily)   Diet/type: tubefeeds DVT prophylaxis: LMWH GI prophylaxis: PPI Lines: N/A Foley:  Yes, and it is still needed Code Status:  full code Last date of multidisciplinary goals of care discussion POA at bedside yesterday 3/7  Labs   CBC: Recent Labs  Lab 05/17/2022 0726 05/27/2022 0733 05/12/22 0035 05/12/22 0619 05/12/22 0947 05/13/22 0519 05/13/22 1048 05/15/22 0128 05/16/22 0230  WBC 7.2  --  4.2  --   --   --  8.8 8.0 7.9  NEUTROABS 5.0  --   --   --   --   --   --   --   --   HGB 12.6   < > 11.5*   < > 12.9 12.2 11.8* 11.5* 11.4*  HCT 43.0   < > 41.6   < > 38.0 36.0 37.0 37.5 38.1  MCV 89.0  --  92.0  --   --   --  82.2 83.0 85.8  PLT 158  --  119*  --   --   --  128* 127* 129*   < > = values in this interval not displayed.     Basic Metabolic Panel: Recent Labs  Lab 05/13/2022 0726 05/25/2022 0733 06/01/2022 0734 05/12/22 0035 05/12/22 0619 05/12/22 0947 05/12/22 1713 05/13/22 0519 05/13/22 0731 05/13/22 1633 05/15/22 0128 05/16/22 0230  NA 141 143   < > 143   < > 144  --  147* 143  --  152* 149*  K 3.9 4.0   < > 4.2   < > 4.3  --  3.2* 3.5  --  3.7 4.4  CL 102 101  --  107  --   --   --   --  109  --  119* 115*  CO2 35*  --   --  30  --   --   --   --  25  --  24 25  GLUCOSE 201* 203*  --  185*  --   --   --   --  391*  --  178* 177*  BUN 18 20  --  25*  --   --   --   --  43*  --  51* 43*  CREATININE 0.89 0.90  --  0.94  --   --   --   --  0.98  --  0.62 0.62  CALCIUM 8.8*  --   --  8.7*  --   --   --   --  8.7*  --  8.9 8.8*  MG 1.9  --   --  1.9  --   --  2.4  --  2.2 2.3 2.2 2.0  PHOS  --   --   --   --   --   --  1.1*  --  3.3 2.7 2.3* 2.8   < > = values in this interval not displayed.    GFR: Estimated Creatinine Clearance: 72.7 mL/min (by C-G formula based on SCr of 0.62 mg/dL). Recent  Labs  Lab 05/08/2022 0726 05/25/2022 0945 05/12/22 0035 05/13/22 1048 05/15/22 0128 05/16/22 0230  PROCALCITON <0.10  --   --   --   --   --  WBC 7.2  --  4.2 8.8 8.0 7.9  LATICACIDVEN 1.0 1.0  --   --   --   --      Liver Function Tests: Recent Labs  Lab 05/22/2022 0726  AST 15  ALT 12  ALKPHOS 54  BILITOT 0.5  PROT 7.2  ALBUMIN 3.2*    No results for input(s): "LIPASE", "AMYLASE" in the last 168 hours. Recent Labs  Lab 05/12/2022 0726  AMMONIA 24     ABG    Component Value Date/Time   PHART 7.425 05/13/2022 0519   PCO2ART 35.5 05/13/2022 0519   PO2ART 86 05/13/2022 0519   HCO3 23.3 05/13/2022 0519   TCO2 24 05/13/2022 0519   ACIDBASEDEF 1.0 05/13/2022 0519   O2SAT 97 05/13/2022 0519     Coagulation Profile: Recent Labs  Lab  0726  INR 1.1     Cardiac Enzymes: No results for input(s): "CKTOTAL", "CKMB", "CKMBINDEX", "TROPONINI" in the last 168 hours.  HbA1C: Hgb A1c MFr Bld  Date/Time Value Ref Range Status  05/08/2022 10:57 PM 7.3 (H) 4.8 - 5.6 % Final    Comment:    (NOTE)         Prediabetes: 5.7 - 6.4         Diabetes: >6.4         Glycemic control for adults with diabetes: <7.0   09/07/2020 05:35 AM 6.8 (H) 4.8 - 5.6 % Final    Comment:    (NOTE)         Prediabetes: 5.7 - 6.4         Diabetes: >6.4         Glycemic control for adults with diabetes: <7.0     CBG: Recent Labs  Lab 05/15/22 1524 05/15/22 1949 05/15/22 2323 05/16/22 0402 05/16/22 0743  GLUCAP 213* 180* 210* 163* 114*     Review of Systems:     Past Medical History:  She,  has a past medical history of Chronic acquired lymphedema, Chronic heart failure with preserved ejection fraction (HFpEF) ([facility]), COPD (chronic obstructive pulmonary disease) ([place]), CVA (cerebral vascular accident) ([place]), DM (diabetes mellitus) ([place]), HLD (hyperlipidemia), Hypertension, Morbid obesity with BMI of 50.0-59.9, adult ([place]), Partial seizure disorder ([place]), and SSS (sick sinus  syndrome) ([place]).   Surgical History:   Past Surgical History:  Procedure Laterality Date   APPENDECTOMY     BREAST SURGERY     colectomy     rectal surgery   HERNIA REPAIR     PACEMAKER IMPLANT     TONSILLECTOMY       Social History:   reports that she has never smoked. She has never used smokeless tobacco. She reports that she does not drink alcohol and does not use drugs.   Family History:  Her family history includes Diabetes in her sister; Heart disease in her sister.   Allergies Allergies  Allergen Reactions   Ativan [Lorazepam] Other (See Comments)    "Stopped breathing"    Sanjuan Dame, MD Internal Medicine PGY-3 Pager: [phone]

## 2022-05-16 NOTE — Progress Notes (Signed)
RT NOTE: patient placed on CPAP/PSV of 10/5 at 0805.  Currently tolerating well at this time.  Will continue to monitor.

## 2022-05-16 NOTE — Procedures (Signed)
Cortrak  Person Inserting Tube:  Maylon Peppers C, RD Tube Type:  Cortrak - 43 inches Tube Location:  Left nare Secured by: Bridle Technique Used to Measure Tube Placement:  Marking at nare/corner of mouth Cortrak Secured At:  57 cm   Cortrak Tube Team Note:  Consult received to place a Cortrak feeding tube.  Unable to advance tube beyond 57 cm. Xray pending  X-ray is required, abdominal x-ray has been ordered by the Cortrak team. Please confirm tube placement before using the Cortrak tube.   If the tube becomes dislodged please keep the tube and contact the Cortrak team at [email or web address] for replacement.  If after hours and replacement cannot be delayed, place a NG tube and confirm placement with an abdominal x-ray.    Lockie Pares., RD, LDN, CNSC See AMiON for contact information

## 2022-05-17 ENCOUNTER — Inpatient Hospital Stay (HOSPITAL_COMMUNITY): Payer: 59

## 2022-05-17 DIAGNOSIS — A419 Sepsis, unspecified organism: Secondary | ICD-10-CM | POA: Diagnosis present

## 2022-05-17 DIAGNOSIS — J9621 Acute and chronic respiratory failure with hypoxia: Secondary | ICD-10-CM | POA: Diagnosis not present

## 2022-05-17 DIAGNOSIS — R579 Shock, unspecified: Secondary | ICD-10-CM

## 2022-05-17 DIAGNOSIS — J9622 Acute and chronic respiratory failure with hypercapnia: Secondary | ICD-10-CM | POA: Diagnosis not present

## 2022-05-17 LAB — CBC
HCT: 48.9 % — ABNORMAL HIGH (ref 36.0–46.0)
HCT: 39.2 % (ref 36.0–46.0)
Hemoglobin: 14.7 g/dL (ref 12.0–15.0)
Hemoglobin: 12 g/dL (ref 12.0–15.0)
MCH: 25.3 pg — ABNORMAL LOW (ref 26.0–34.0)
MCH: 25.6 pg — ABNORMAL LOW (ref 26.0–34.0)
MCHC: 30.1 g/dL (ref 30.0–36.0)
MCHC: 30.6 g/dL (ref 30.0–36.0)
MCV: 84.3 fL (ref 80.0–100.0)
MCV: 83.8 fL (ref 80.0–100.0)
Platelets: 141 10*3/uL — ABNORMAL LOW (ref 150–400)
Platelets: 137 10*3/uL — ABNORMAL LOW (ref 150–400)
RBC: 5.8 MIL/uL — ABNORMAL HIGH (ref 3.87–5.11)
RBC: 4.68 MIL/uL (ref 3.87–5.11)
RDW: 17.5 % — ABNORMAL HIGH (ref 11.5–15.5)
RDW: 16.5 % — ABNORMAL HIGH (ref 11.5–15.5)
WBC: 5.5 10*3/uL (ref 4.0–10.5)
WBC: 7.4 10*3/uL (ref 4.0–10.5)
nRBC: 0 % (ref 0.0–0.2)
nRBC: 0.3 % — ABNORMAL HIGH (ref 0.0–0.2)

## 2022-05-17 LAB — GLUCOSE, CAPILLARY
Glucose-Capillary: 76 mg/dL (ref 70–99)
Glucose-Capillary: 84 mg/dL (ref 70–99)
Glucose-Capillary: 84 mg/dL (ref 70–99)
Glucose-Capillary: 154 mg/dL — ABNORMAL HIGH (ref 70–99)
Glucose-Capillary: 151 mg/dL — ABNORMAL HIGH (ref 70–99)
Glucose-Capillary: 162 mg/dL — ABNORMAL HIGH (ref 70–99)
Glucose-Capillary: 131 mg/dL — ABNORMAL HIGH (ref 70–99)

## 2022-05-17 LAB — POCT I-STAT 7, (LYTES, BLD GAS, ICA,H+H)
Acid-base deficit: 2 mmol/L (ref 0.0–2.0)
Acid-base deficit: 1 mmol/L (ref 0.0–2.0)
Acid-base deficit: 2 mmol/L (ref 0.0–2.0)
Bicarbonate: 24.1 mmol/L (ref 20.0–28.0)
Bicarbonate: 25.8 mmol/L (ref 20.0–28.0)
Bicarbonate: 22.8 mmol/L (ref 20.0–28.0)
Calcium, Ion: 1.32 mmol/L (ref 1.15–1.40)
Calcium, Ion: 1.27 mmol/L (ref 1.15–1.40)
Calcium, Ion: 1.2 mmol/L (ref 1.15–1.40)
HCT: 44 % (ref 36.0–46.0)
HCT: 43 % (ref 36.0–46.0)
HCT: 38 % (ref 36.0–46.0)
Hemoglobin: 15 g/dL (ref 12.0–15.0)
Hemoglobin: 14.6 g/dL (ref 12.0–15.0)
Hemoglobin: 12.9 g/dL (ref 12.0–15.0)
O2 Saturation: 99 %
O2 Saturation: 98 %
O2 Saturation: 85 %
Patient temperature: 98.2
Patient temperature: 99.5
Patient temperature: 99.5
Potassium: 4.2 mmol/L (ref 3.5–5.1)
Potassium: 3.8 mmol/L (ref 3.5–5.1)
Potassium: 4.2 mmol/L (ref 3.5–5.1)
Sodium: 148 mmol/L — ABNORMAL HIGH (ref 135–145)
Sodium: 150 mmol/L — ABNORMAL HIGH (ref 135–145)
Sodium: 146 mmol/L — ABNORMAL HIGH (ref 135–145)
TCO2: 25 mmol/L (ref 22–32)
TCO2: 27 mmol/L (ref 22–32)
TCO2: 24 mmol/L (ref 22–32)
pCO2 arterial: 46 mmHg (ref 32–48)
pCO2 arterial: 51.3 mmHg — ABNORMAL HIGH (ref 32–48)
pCO2 arterial: 39.3 mmHg (ref 32–48)
pH, Arterial: 7.326 — ABNORMAL LOW (ref 7.35–7.45)
pH, Arterial: 7.312 — ABNORMAL LOW (ref 7.35–7.45)
pH, Arterial: 7.374 (ref 7.35–7.45)
pO2, Arterial: 124 mmHg — ABNORMAL HIGH (ref 83–108)
pO2, Arterial: 115 mmHg — ABNORMAL HIGH (ref 83–108)
pO2, Arterial: 53 mmHg — ABNORMAL LOW (ref 83–108)

## 2022-05-17 LAB — BASIC METABOLIC PANEL
Anion gap: 9 (ref 5–15)
Anion gap: 13 (ref 5–15)
Anion gap: 16 — ABNORMAL HIGH (ref 5–15)
BUN: 35 mg/dL — ABNORMAL HIGH (ref 8–23)
BUN: 43 mg/dL — ABNORMAL HIGH (ref 8–23)
BUN: 43 mg/dL — ABNORMAL HIGH (ref 8–23)
CO2: 23 mmol/L (ref 22–32)
CO2: 20 mmol/L — ABNORMAL LOW (ref 22–32)
CO2: 18 mmol/L — ABNORMAL LOW (ref 22–32)
Calcium: 9 mg/dL (ref 8.9–10.3)
Calcium: 8.1 mg/dL — ABNORMAL LOW (ref 8.9–10.3)
Calcium: 8.3 mg/dL — ABNORMAL LOW (ref 8.9–10.3)
Chloride: 114 mmol/L — ABNORMAL HIGH (ref 98–111)
Chloride: 107 mmol/L (ref 98–111)
Chloride: 104 mmol/L (ref 98–111)
Creatinine, Ser: 0.77 mg/dL (ref 0.44–1.00)
Creatinine, Ser: 1.48 mg/dL — ABNORMAL HIGH (ref 0.44–1.00)
Creatinine, Ser: 1.64 mg/dL — ABNORMAL HIGH (ref 0.44–1.00)
GFR, Estimated: >60 mL/min (ref >60)
GFR, Estimated: 36 mL/min — ABNORMAL LOW (ref >60)
GFR, Estimated: 32 mL/min — ABNORMAL LOW (ref >60)
Glucose, Bld: 111 mg/dL — ABNORMAL HIGH (ref 70–99)
Glucose, Bld: 200 mg/dL — ABNORMAL HIGH (ref 70–99)
Glucose, Bld: 197 mg/dL — ABNORMAL HIGH (ref 70–99)
Potassium: 4.2 mmol/L (ref 3.5–5.1)
Potassium: 5.9 mmol/L — ABNORMAL HIGH (ref 3.5–5.1)
Potassium: 4.5 mmol/L (ref 3.5–5.1)
Sodium: 146 mmol/L — ABNORMAL HIGH (ref 135–145)
Sodium: 140 mmol/L (ref 135–145)
Sodium: 138 mmol/L (ref 135–145)

## 2022-05-17 LAB — LEGIONELLA PNEUMOPHILA SEROGP 1 UR AG: L. pneumophila Serogp 1 Ur Ag: NEGATIVE

## 2022-05-17 LAB — CULTURE, RESPIRATORY W GRAM STAIN

## 2022-05-17 LAB — LACTIC ACID, PLASMA
Lactic Acid, Venous: 4.9 mmol/L (ref 0.5–1.9)
Lactic Acid, Venous: 4 mmol/L (ref 0.5–1.9)

## 2022-05-17 LAB — MAGNESIUM: Magnesium: 2.1 mg/dL (ref 1.7–2.4)

## 2022-05-17 LAB — PHOSPHORUS: Phosphorus: 2.7 mg/dL (ref 2.5–4.6)

## 2022-05-17 MED ORDER — SODIUM BICARBONATE 8.4 % IV SOLN
INTRAVENOUS | Status: AC
  Filled 2022-05-17: qty 100

## 2022-05-17 MED ORDER — ETOMIDATE 2 MG/ML IV SOLN
INTRAVENOUS | Status: AC
  Filled 2022-05-17: qty 20

## 2022-05-17 MED ORDER — PHENYLEPHRINE HCL-NACL 20-0.9 MG/250ML-% IV SOLN
0.0000 ug/min | INTRAVENOUS | Status: DC
  Administered 2022-05-17: 20 ug/min via INTRAVENOUS
  Filled 2022-05-17: qty 250

## 2022-05-17 MED ORDER — PHENYLEPHRINE 80 MCG/ML (10ML) SYRINGE FOR IV PUSH (FOR BLOOD PRESSURE SUPPORT): 80.0000 ug | PREFILLED_SYRINGE | Freq: Once | INTRAVENOUS | Status: AC

## 2022-05-17 MED ORDER — ETOMIDATE 2 MG/ML IV SOLN
20.0000 mg | Freq: Once | INTRAVENOUS | Status: AC
  Administered 2022-05-17: 20 mg via INTRAVENOUS

## 2022-05-17 MED ORDER — FENTANYL CITRATE PF 50 MCG/ML IJ SOSY
PREFILLED_SYRINGE | INTRAMUSCULAR | Status: AC
  Filled 2022-05-17: qty 2

## 2022-05-17 MED ORDER — METHYLPREDNISOLONE SODIUM SUCC 40 MG IJ SOLR
40.0000 mg | Freq: Every day | INTRAMUSCULAR | Status: DC
  Administered 2022-05-17: 40 mg via INTRAVENOUS
  Filled 2022-05-17: qty 1

## 2022-05-17 MED ORDER — PHENYLEPHRINE 80 MCG/ML (10ML) SYRINGE FOR IV PUSH (FOR BLOOD PRESSURE SUPPORT)
80.0000 ug | PREFILLED_SYRINGE | Freq: Once | INTRAVENOUS | Status: AC
  Administered 2022-05-17: 80 ug via INTRAVENOUS

## 2022-05-17 MED ORDER — ROCURONIUM BROMIDE 10 MG/ML (PF) SYRINGE
PREFILLED_SYRINGE | INTRAVENOUS | Status: AC
  Filled 2022-05-17: qty 10

## 2022-05-17 MED ORDER — SODIUM BICARBONATE 8.4 % IV SOLN
50.0000 meq | Freq: Once | INTRAVENOUS | Status: AC
  Administered 2022-05-17: 50 meq via INTRAVENOUS

## 2022-05-17 MED ORDER — VANCOMYCIN VARIABLE DOSE PER UNSTABLE RENAL FUNCTION (PHARMACIST DOSING): Status: DC

## 2022-05-17 MED ORDER — SODIUM CHLORIDE 0.9 % IV SOLN
250.0000 mL | INTRAVENOUS | Status: DC
  Administered 2022-05-17: 250 mL via INTRAVENOUS

## 2022-05-17 MED ORDER — FENTANYL CITRATE PF 50 MCG/ML IJ SOSY
100.0000 ug | PREFILLED_SYRINGE | Freq: Once | INTRAMUSCULAR | Status: AC
  Administered 2022-05-17: 100 ug via INTRAVENOUS

## 2022-05-17 MED ORDER — IPRATROPIUM-ALBUTEROL 0.5-2.5 (3) MG/3ML IN SOLN: 3.0000 mL | Freq: Once | RESPIRATORY_TRACT | Status: DC

## 2022-05-17 MED ORDER — NOREPINEPHRINE 16 MG/250ML-% IV SOLN
0.0000 ug/min | INTRAVENOUS | Status: DC
  Administered 2022-05-17 (×2): 50 ug/min via INTRAVENOUS
  Administered 2022-05-17: 40 ug/min via INTRAVENOUS
  Administered 2022-05-18: 30 ug/min via INTRAVENOUS
  Administered 2022-05-18 (×3): 40 ug/min via INTRAVENOUS
  Administered 2022-05-18 (×5): 80 ug/min via INTRAVENOUS
  Administered 2022-05-18: 40 ug/min via INTRAVENOUS
  Administered 2022-05-19 (×5): 80 ug/min via INTRAVENOUS
  Filled 2022-05-17 (×2): qty 250
  Filled 2022-05-17: qty 500
  Filled 2022-05-17 (×15): qty 250

## 2022-05-17 MED ORDER — SODIUM ZIRCONIUM CYCLOSILICATE 5 G PO PACK
5.0000 g | PACK | Freq: Once | ORAL | Status: DC
  Filled 2022-05-17: qty 1

## 2022-05-17 MED ORDER — VASOPRESSIN 20 UNITS/100 ML INFUSION FOR SHOCK
INTRAVENOUS | Status: AC
  Filled 2022-05-17: qty 100

## 2022-05-17 MED ORDER — VANCOMYCIN HCL 2000 MG/400ML IV SOLN
2000.0000 mg | Freq: Once | INTRAVENOUS | Status: AC
  Administered 2022-05-17: 2000 mg via INTRAVENOUS
  Filled 2022-05-17: qty 400

## 2022-05-17 MED ORDER — VANCOMYCIN HCL 1250 MG/250ML IV SOLN: 1250.0000 mg | INTRAVENOUS | Status: DC

## 2022-05-17 MED ORDER — ROCURONIUM BROMIDE 10 MG/ML (PF) SYRINGE
100.0000 mg | PREFILLED_SYRINGE | Freq: Once | INTRAVENOUS | Status: AC
  Administered 2022-05-17: 100 mg via INTRAVENOUS

## 2022-05-17 MED ORDER — PHENYLEPHRINE 80 MCG/ML (10ML) SYRINGE FOR IV PUSH (FOR BLOOD PRESSURE SUPPORT)
PREFILLED_SYRINGE | INTRAVENOUS | Status: AC
  Administered 2022-05-17: 800 ug
  Filled 2022-05-17: qty 20

## 2022-05-17 MED ORDER — LACTATED RINGERS IV SOLN: INTRAVENOUS | Status: AC

## 2022-05-17 MED ORDER — MIDAZOLAM-SODIUM CHLORIDE 100-0.9 MG/100ML-% IV SOLN: 0.5000 mg/h | INTRAVENOUS | Status: DC

## 2022-05-17 MED ORDER — NOREPINEPHRINE 4 MG/250ML-% IV SOLN
0.0000 ug/min | INTRAVENOUS | Status: DC
  Administered 2022-05-17 (×4): 40 ug/min via INTRAVENOUS
  Administered 2022-05-17: 50 ug/min via INTRAVENOUS
  Administered 2022-05-17: 32 ug/min via INTRAVENOUS
  Administered 2022-05-17: 40 ug/min via INTRAVENOUS
  Filled 2022-05-17 (×6): qty 250

## 2022-05-17 MED ORDER — LACTATED RINGERS IV BOLUS
1000.0000 mL | Freq: Once | INTRAVENOUS | Status: AC
  Administered 2022-05-17: 1000 mL via INTRAVENOUS

## 2022-05-17 MED ORDER — NOREPINEPHRINE 4 MG/250ML-% IV SOLN
2.0000 ug/min | INTRAVENOUS | Status: DC
  Administered 2022-05-17: 24 ug/min via INTRAVENOUS
  Administered 2022-05-17: 4 ug/min via INTRAVENOUS
  Filled 2022-05-17 (×2): qty 250

## 2022-05-17 MED ORDER — SODIUM CHLORIDE 0.9 % IV SOLN
2.0000 g | Freq: Two times a day (BID) | INTRAVENOUS | Status: DC
  Administered 2022-05-18: 2 g via INTRAVENOUS
  Filled 2022-05-17: qty 12.5

## 2022-05-17 MED ORDER — SODIUM BICARBONATE 8.4 % IV SOLN
INTRAVENOUS | Status: AC
  Filled 2022-05-17: qty 50

## 2022-05-17 MED ORDER — PHENYLEPHRINE CONCENTRATED 100MG/250ML (0.4 MG/ML) INFUSION SIMPLE
0.0000 ug/min | INTRAVENOUS | Status: DC
  Administered 2022-05-17: 160 ug/min via INTRAVENOUS
  Administered 2022-05-18: 20 ug/min via INTRAVENOUS
  Administered 2022-05-18 – 2022-05-19 (×6): 400 ug/min via INTRAVENOUS
  Filled 2022-05-17 (×11): qty 250

## 2022-05-17 MED ORDER — SODIUM CHLORIDE 0.9 % IV SOLN
2.0000 g | Freq: Three times a day (TID) | INTRAVENOUS | Status: DC
  Administered 2022-05-17 (×2): 2 g via INTRAVENOUS
  Filled 2022-05-17 (×2): qty 12.5

## 2022-05-17 MED ORDER — PHENYLEPHRINE 80 MCG/ML (10ML) SYRINGE FOR IV PUSH (FOR BLOOD PRESSURE SUPPORT)
PREFILLED_SYRINGE | INTRAVENOUS | Status: AC
  Administered 2022-05-17: 800 ug
  Filled 2022-05-17: qty 10

## 2022-05-17 MED ORDER — VASOPRESSIN 20 UNITS/100 ML INFUSION FOR SHOCK
0.0300 [IU]/min | INTRAVENOUS | Status: DC
  Administered 2022-05-17 – 2022-05-19 (×6): 0.03 [IU]/min via INTRAVENOUS
  Filled 2022-05-17 (×5): qty 100

## 2022-05-17 NOTE — Progress Notes (Signed)
Pharmacy Antibiotic Note  Grace Short is a 77 y.o. female admitted on 05/15/2022 with pneumonia. Pharmacy has been consulted for vancomycin and cefepime dosing. Patient extubated 3/11, then reintubated 3/12 and started on norepinephrine and vasopressin. Scr 0.77 at baseline. WBC 7.4 wnl. Pt afebrile.  Scr up to 1.64 and UOP down to only 91m past 12 hours.  Plan: Change Cefepime to 2gm IV q12h D/c Vancomycin Will f/u SCr in a.m. to further guide vanc dosing  Height: '5\' 2"'$  (157.5 cm) Weight: 114.5 kg (252 lb 6.8 oz) IBW/kg (Calculated) : 50.1  Temp (24hrs), Avg:99.1 F (37.3 C), Min:98.2 F (36.8 C), Max:99.5 F (37.5 C)  Recent Labs  Lab 05/09/2022 0726 05/22/2022 0733 05/27/2022 0945 05/12/22 0035 05/13/22 1048 05/15/22 0128 05/16/22 0230 05/17/22 0200 05/17/22 1228 05/17/22 1411 05/17/22 1715 05/17/22 1746  WBC 7.2  --   --    < > 8.8 8.0 7.9 5.5 7.4  --   --   --   CREATININE 0.89   < >  --    < >  --  0.62 0.62 0.77  --  1.48*  --  1.64*  LATICACIDVEN 1.0  --  1.0  --   --   --   --   --  4.9*  --  4.0*  --    < > = values in this interval not displayed.     Estimated Creatinine Clearance: 34.4 mL/min (A) (by C-G formula based on SCr of 1.64 mg/dL (H)).    Allergies  Allergen Reactions   Ativan [Lorazepam] Other (See Comments)    "Stopped breathing"    Antimicrobials this admission: Vancomycin 3/6 x 1, 3/12>> Flagyl 3/6 x 1 Cefepime 3/6 > 3/7, 3/12 >>  Ceftriaxone 3/7 >> 3/10   Microbiology results: 3/6 bcx - ngtd 3/6 sputum cx - rare candida albicans, MSSA 3/6 rvp - metapneumovirus  3/12 bcx - sent 3/12 respiratory cx - sent   CSherlon Handing PharmD, BCPS Please see amion for complete clinical pharmacist phone list 05/17/2022  9:28 PM

## 2022-05-17 NOTE — Procedures (Signed)
Arterial Catheter Insertion Procedure Note  Grace Short  [id number]  09/23/1945  Date:05/17/22  Time:4:12 AM    Provider Performing: Dimple Nanas    Procedure: Insertion of Arterial Line [phone]) without US guidance  Indication(s) Blood pressure monitoring and/or need for frequent ABGs  Consent Unable to obtain consent due to emergent nature of procedure.  Anesthesia None   Time Out Verified patient identification, verified procedure, site/side was marked, verified correct patient position, special equipment/implants available, medications/allergies/relevant history reviewed, required imaging and test results available.   Sterile Technique Maximal sterile technique including full sterile barrier drape, hand hygiene, sterile gown, sterile gloves, mask, hair covering, sterile ultrasound probe cover (if used).   Procedure Description Area of catheter insertion was cleaned with chlorhexidine and draped in sterile fashion. With real-time ultrasound guidance an arterial catheter was placed into the left radial artery.  Appropriate arterial tracings confirmed on monitor.     Complications/Tolerance None; patient tolerated the procedure well.   EBL Minimal   Specimen(s) None

## 2022-05-17 NOTE — Progress Notes (Signed)
Patient's urine output during my shift was 50cc for 12 hours.  MD notified.  No new orders received at this time.

## 2022-05-17 NOTE — Progress Notes (Signed)
Called to evaluate patient.  Tachypnea and shallow breaths.  B wheezes.   No improvement with duonebs.  Tachycardia not improving.    Secretions suctioned are thick and tenacious.    Decided to proceed with intubation.

## 2022-05-17 NOTE — Progress Notes (Addendum)
eLink Physician-Brief Progress Note Patient Name: Grace Short. Bozek DOB: [birth year] MRN: [id number]   Date of Service  05/17/2022  HPI/Events of Note  77 year old female that was initially admitted with acute encephalopathy and dyspnea in the setting of heart failure with preserved ejection fraction, COPD, CVA and metabolic syndrome in the setting of morbid obesity.  She was initially intubated on 3/11 and eventually extubated on 3/11 in the setting of metapneumovirus respiratory infection.  Asked to urgently evaluate the patient due to progressively worsening dyspnea, inability to clear secretions, worsening after NT suction.  On examination, the patient is having difficulty moving air, oxygenating appropriately although definitely has increased work of breathing.  Gasping.  Using accessory muscles.  eICU Interventions  Lower to the ground team for worrisome progression for intubation evaluation.  Ensuring 2 PIV access.  ABG was sent. PRN NT suction and added Levo as needed to maintain map >65    0426 - ongoing urinary retention, unreliable external collection -> foley for now   Intervention Category Major Interventions: Respiratory failure - evaluation and management  Rolla Kedzierski 05/17/2022, 2:37 AM

## 2022-05-17 NOTE — Progress Notes (Addendum)
Pharmacy Antibiotic Note  Grace Short is a 77 y.o. female admitted on 05/09/2022 with pneumonia. Pharmacy has been consulted for vancomycin and cefepime dosing. Patient extubated 3/11, then reintubated 3/12 and started on norepinephrine and vasopressin. Scr 0.77 at baseline. WBC 7.4 wnl. Pt afebrile.  Plan: Vancomycin 2000 mg IV x 1, then 1250 mg IV q24h (eAUC 507.1, Vd 0.5 for BMI >30, Scr rounded to 0.8) Cefepime 2g IV q8h Monitor C&S, renal function, s/sx improvement, LOT and vancomycin levels @ steady state as indicated   Height: '5\' 2"'$  (157.5 cm) Weight: 114.5 kg (252 lb 6.8 oz) IBW/kg (Calculated) : 50.1  Temp (24hrs), Avg:98.9 F (37.2 C), Min:98 F (36.7 C), Max:99.5 F (37.5 C)  Recent Labs  Lab 05/07/2022 0726 05/29/2022 0733 05/14/2022 0945 05/12/22 0035 05/13/22 0731 05/13/22 1048 05/15/22 0128 05/16/22 0230 05/17/22 0200  WBC 7.2  --   --  4.2  --  8.8 8.0 7.9 5.5  CREATININE 0.89   < >  --  0.94 0.98  --  0.62 0.62 0.77  LATICACIDVEN 1.0  --  1.0  --   --   --   --   --   --    < > = values in this interval not displayed.    Estimated Creatinine Clearance: 70.6 mL/min (by C-G formula based on SCr of 0.77 mg/dL).    Allergies  Allergen Reactions   Ativan [Lorazepam] Other (See Comments)    "Stopped breathing"    Antimicrobials this admission: Vancomycin 3/6 x 1, 3/12>> Flagyl 3/6 x 1 Cefepime 3/6 > 3/7, 3/12 >>  Ceftriaxone 3/7 >> 3/10   Microbiology results: 3/6 bcx - ngtd 3/6 sputum cx - rare candida albicans, MSSA 3/6 rvp - metapneumovirus  3/12 bcx - sent 3/12 respiratory cx - sent   Eliseo Gum, PharmD PGY1 Pharmacy Resident   05/17/2022  4:06 PM

## 2022-05-17 NOTE — Progress Notes (Addendum)
NAME:  Grace Short, MRN:  [id number], DOB:  [birth year], LOS: 6 ADMISSION DATE:  05/12/2022, CONSULTATION DATE:  05/12/2022 REFERRING MD:  Dr. Tamala Julian, CHIEF COMPLAINT:  AMS, Sepsis   History of Present Illness:  Grace Short is a 77 y.o. F with PMH of HTN, HL, CHF, COPD on 2L home O2, seizures, chronic lymphedema who was noted to be altered with labored breathing at SNF on the day of admission. EMS was called and pt was 84% on 2L.  She was brought to the ED where she was placed on non-rebreather and fever up to 101.6, she was noted to be hypercarbic on ABG so transitioned to Bipap.  She had some shaking up the upper extremities so EEG was ordered. She underwent CT of the chest /abd/pelvis showing RUL PNA.  Labs significant for normal lactic acid, covid, flu and RSV negative, no leukocytosis, BNP and troponin WNL and procalcitonin <0.1.   She was treated with cefepime, vancomycin and flagyl and admitted to the hospital medicine service, however had repeated ABG's showing respiratory acidosis and AMS, so PCCM consulted   Pertinent  Medical History  has a past medical history of Chronic acquired lymphedema, Chronic heart failure with preserved ejection fraction (HFpEF) ([place]), COPD (chronic obstructive pulmonary disease) ([place]), CVA (cerebral vascular accident) ([place]), DM (diabetes mellitus) ([place]), HLD (hyperlipidemia), Hypertension, Morbid obesity with BMI of 50.0-59.9, adult ([place]), Partial seizure disorder ([place]), and SSS (sick sinus syndrome) ([place]).   Significant Hospital Events: Including procedures, antibiotic start and stop dates in addition to other pertinent events   3/5 presented to the ED from snf with AMS and hypoxia 3/6 persistent respiratory acidosis, PCCM consult, on bipap 3/7 Intubated 3/9 - agitation 3/10 - remains agitated 3/11 - extubated 3/12 - re-intubated d/t excess secretions, unable to clear  Interim History / Subjective:  Patient was extubated yesterday. Overnight she had increase  in secretions and was unable to clear them. She had increase work of breathing and decision was made to re-intubate.   Objective   Blood pressure (!) 106/52, pulse (!) 109, temperature 98.5 F (36.9 C), temperature source Axillary, resp. rate (!) 28, height '5\' 2"'$  (1.575 m), weight 114.5 kg, SpO2 94 %.    Vent Mode: PRVC FiO2 (%):  [60 %-100 %] 60 % Set Rate:  [22 bmp-24 bmp] 24 bmp Vt Set:  [400 mL] 400 mL PEEP:  [5 cmH20] 5 cmH20 Plateau Pressure:  [27 cmH20] 27 cmH20  Peak 20  Intake/Output Summary (Last 24 hours) at 05/17/2022 1001 Last data filed at 05/17/2022 0800 Gross per 24 hour  Intake 1450.21 ml  Output 1200 ml  Net 250.21 ml   Filed Weights   05/16/22 0404 05/16/22 2344 05/17/22 0500  Weight: 120.4 kg 114.5 kg 114.5 kg   Examination: General: Chronically ill-appearing, no acute distress HENT: NCAT. Moist mucous membranes, ET in place Respiratory: On ventilatory support. Wheezing appreciated throughout.  Cardiovascular: Regular rate, rhythm. No murmurs. Radial pulses difficult to palpate.  GI: Soft, non-distended. Bowel sounds appreciated. Extremities: Chronic lymphedema with wraps in place Neuro: Somnolent, but awake, follows commands  Resolved Hospital Problem list   Hypercarbia 05/12/2022  Assessment & Plan:  #Acute encephalopathy secondary to respiratory failure #Acute hypoxemic and hypercapnic respiratory failure #Metapneumovirus infection Re-intubated overnight, unable to clear secretions.  Recently completed five days of antibiotics. She did have significant wheezing this morning on exam, plan to re-start IV steroids.  - Re-start Solumedrol '40mg'$  daily - Continue bronchodilator treatments - Lung protective strategies  -  VAP bundle, PPI - Precedex, fentanyl gtt  #Type 2 diabetes Getting tube feeds, sugars appear well-controlled.  - Semglee 30 units daily - Novolog 8u q4h + SSI  #Hypernatremia Continuing to improve with free water  flushes  #Hypertension Blood pressure at goal on amlodipine.  #Heart failure with preserved ejection fraction No acute changes, holding home medications for now.  #Lymphedema -Continue Unna boots w/ twice twice weekly changing -Per WOC  #History of partial seizures - On Keppra  Best Practice (right click and "Reselect all SmartList Selections" daily)   Diet/type: tubefeeds DVT prophylaxis: LMWH GI prophylaxis: PPI Lines: Central line Foley:  Yes, and it is still needed Code Status:  full code Last date of multidisciplinary goals of care discussion POA at bedside 3/7  Labs   CBC: Recent Labs  Lab 05/18/2022 0726 05/20/2022 0733 05/12/22 0035 05/12/22 0619 05/13/22 1048 05/15/22 0128 05/16/22 0230 05/17/22 0200 05/17/22 0239 05/17/22 0501  WBC 7.2  --  4.2  --  8.8 8.0 7.9 5.5  --   --   NEUTROABS 5.0  --   --   --   --   --   --   --   --   --   HGB 12.6   < > 11.5*   < > 11.8* 11.5* 11.4* 14.7 15.0 14.6  HCT 43.0   < > 41.6   < > 37.0 37.5 38.1 48.9* 44.0 43.0  MCV 89.0  --  92.0  --  82.2 83.0 85.8 84.3  --   --   PLT 158  --  119*  --  128* 127* 129* 141*  --   --    < > = values in this interval not displayed.    Basic Metabolic Panel: Recent Labs  Lab 05/12/22 0035 05/12/22 0619 05/13/22 0731 05/13/22 1633 05/15/22 0128 05/16/22 0230 05/17/22 0200 05/17/22 0239 05/17/22 0501  NA 143   < > 143  --  152* 149* 146* 148* 150*  K 4.2   < > 3.5  --  3.7 4.4 4.2 4.2 3.8  CL 107  --  109  --  119* 115* 114*  --   --   CO2 30  --  25  --  '24 25 23  '$ --   --   GLUCOSE 185*  --  391*  --  178* 177* 111*  --   --   BUN 25*  --  43*  --  51* 43* 35*  --   --   CREATININE 0.94  --  0.98  --  0.62 0.62 0.77  --   --   CALCIUM 8.7*  --  8.7*  --  8.9 8.8* 9.0  --   --   MG 1.9   < > 2.2 2.3 2.2 2.0 2.1  --   --   PHOS  --    < > 3.3 2.7 2.3* 2.8 2.7  --   --    < > = values in this interval not displayed.   GFR: Estimated Creatinine Clearance: 70.6 mL/min (by  C-G formula based on SCr of 0.77 mg/dL). Recent Labs  Lab 05/31/2022 0726 05/23/2022 0945 05/12/22 0035 05/13/22 1048 05/15/22 0128 05/16/22 0230 05/17/22 0200  PROCALCITON <0.10  --   --   --   --   --   --   WBC 7.2  --    < > 8.8 8.0 7.9 5.5  LATICACIDVEN 1.0 1.0  --   --   --   --   --    < > =  values in this interval not displayed.    Liver Function Tests: Recent Labs  Lab 05/18/2022 0726  AST 15  ALT 12  ALKPHOS 54  BILITOT 0.5  PROT 7.2  ALBUMIN 3.2*   No results for input(s): "LIPASE", "AMYLASE" in the last 168 hours. Recent Labs  Lab 05/31/2022 0726  AMMONIA 24    ABG    Component Value Date/Time   PHART 7.312 (L) 05/17/2022 0501   PCO2ART 51.3 (H) 05/17/2022 0501   PO2ART 115 (H) 05/17/2022 0501   HCO3 25.8 05/17/2022 0501   TCO2 27 05/17/2022 0501   ACIDBASEDEF 1.0 05/17/2022 0501   O2SAT 98 05/17/2022 0501     Coagulation Profile: Recent Labs  Lab 05/28/2022 0726  INR 1.1    Cardiac Enzymes: No results for input(s): "CKTOTAL", "CKMB", "CKMBINDEX", "TROPONINI" in the last 168 hours.  HbA1C: Hgb A1c MFr Bld  Date/Time Value Ref Range Status  05/21/2022 10:57 PM 7.3 (H) 4.8 - 5.6 % Final    Comment:    (NOTE)         Prediabetes: 5.7 - 6.4         Diabetes: >6.4         Glycemic control for adults with diabetes: <7.0   09/07/2020 05:35 AM 6.8 (H) 4.8 - 5.6 % Final    Comment:    (NOTE)         Prediabetes: 5.7 - 6.4         Diabetes: >6.4         Glycemic control for adults with diabetes: <7.0     CBG: Recent Labs  Lab 05/16/22 1934 05/16/22 2319 05/17/22 0314 05/17/22 0439 05/17/22 0807  GLUCAP 163* 167* 76 84 84    Review of Systems:     Past Medical History:  She,  has a past medical history of Chronic acquired lymphedema, Chronic heart failure with preserved ejection fraction (HFpEF) ([facility]), COPD (chronic obstructive pulmonary disease) ([place]), CVA (cerebral vascular accident) ([place]), DM (diabetes mellitus) ([place]), HLD  (hyperlipidemia), Hypertension, Morbid obesity with BMI of 50.0-59.9, adult ([place]), Partial seizure disorder ([place]), and SSS (sick sinus syndrome) ([place]).   Surgical History:   Past Surgical History:  Procedure Laterality Date   APPENDECTOMY     BREAST SURGERY     colectomy     rectal surgery   HERNIA REPAIR     PACEMAKER IMPLANT     TONSILLECTOMY       Social History:   reports that she has never smoked. She has never used smokeless tobacco. She reports that she does not drink alcohol and does not use drugs.   Family History:  Her family history includes Diabetes in her sister; Heart disease in her sister.   Allergies Allergies  Allergen Reactions   Ativan [Lorazepam] Other (See Comments)    "Stopped breathing"    Sanjuan Dame, MD Internal Medicine PGY-3 Pager: [phone]

## 2022-05-17 NOTE — Procedures (Signed)
Central Venous Catheter Insertion Procedure Note  Grace Short  [id number]  20-Oct-1945  Date:05/17/22  Time:7:15 AM   Provider Performing:Trusten Hume Duwayne Heck   Procedure: Insertion of Non-tunneled Central Venous Catheter(36556) with US guidance [id number])   Indication(s) Medication administration  Consent Unable to obtain consent due to emergent nature of procedure.  Anesthesia Topical only with 1% lidocaine   Timeout Verified patient identification, verified procedure, site/side was marked, verified correct patient position, special equipment/implants available, medications/allergies/relevant history reviewed, required imaging and test results available.  Sterile Technique Maximal sterile technique including full sterile barrier drape, hand hygiene, sterile gown, sterile gloves, mask, hair covering, sterile ultrasound probe cover (if used).  Procedure Description Area of catheter insertion was cleaned with chlorhexidine and draped in sterile fashion.  With real-time ultrasound guidance a central venous catheter was placed into the left internal jugular vein. Nonpulsatile blood flow and easy flushing noted in all ports.  The catheter was sutured in place and sterile dressing applied.  Complications/Tolerance None; patient tolerated the procedure well. Chest X-ray is ordered to verify placement for internal jugular or subclavian cannulation.   Chest x-ray is not ordered for femoral cannulation.  EBL Minimal  Specimen(s) None

## 2022-05-17 NOTE — Procedures (Addendum)
Intubation Procedure Note  Grace Short  [id number]  Nov 25, 1945  Date:05/17/22  Time:4:58 AM   Provider Performing:Zander Ingham Duwayne Heck    Procedure: Intubation (31500)  Indication(s) Respiratory Failure  Consent Risks of the procedure as well as the alternatives and risks of each were explained to the patient and/or caregiver.  Consent for the procedure was obtained and is signed in the bedside chart Discussed with designated decision maker Katrina  Anesthesia Etomidate, Fentanyl, and Rocuronium   Time Out Verified patient identification, verified procedure, site/side was marked, verified correct patient position, special equipment/implants available, medications/allergies/relevant history reviewed, required imaging and test results available.   Sterile Technique Usual hand hygeine, masks, and gloves were used   Procedure Description Patient positioned in bed supine.  Sedation given as noted above.  Patient was intubated with endotracheal tube using Glidescope.  View was Grade 2 only posterior commissure .  Diffiuclt to visualize, small andterior airway. Number of attempts was 1.  Colorimetric CO2 detector was consistent with tracheal placement.   Complications/Tolerance None; patient tolerated the procedure well. Chest X-ray is ordered to verify placement.post intubation hypotension.  Started levophed, given bicarb x 1, 3 neo-sticks    EBL None    Specimen(s) None

## 2022-05-18 ENCOUNTER — Inpatient Hospital Stay (HOSPITAL_COMMUNITY): Payer: 59

## 2022-05-18 DIAGNOSIS — R6521 Severe sepsis with septic shock: Secondary | ICD-10-CM | POA: Diagnosis not present

## 2022-05-18 DIAGNOSIS — J9621 Acute and chronic respiratory failure with hypoxia: Secondary | ICD-10-CM | POA: Diagnosis not present

## 2022-05-18 DIAGNOSIS — A419 Sepsis, unspecified organism: Secondary | ICD-10-CM | POA: Diagnosis not present

## 2022-05-18 DIAGNOSIS — J9622 Acute and chronic respiratory failure with hypercapnia: Secondary | ICD-10-CM | POA: Diagnosis not present

## 2022-05-18 DIAGNOSIS — R0609 Other forms of dyspnea: Secondary | ICD-10-CM

## 2022-05-18 LAB — BASIC METABOLIC PANEL
Anion gap: 17 — ABNORMAL HIGH (ref 5–15)
Anion gap: 16 — ABNORMAL HIGH (ref 5–15)
Anion gap: 26 — ABNORMAL HIGH (ref 5–15)
BUN: 46 mg/dL — ABNORMAL HIGH (ref 8–23)
BUN: 49 mg/dL — ABNORMAL HIGH (ref 8–23)
BUN: 54 mg/dL — ABNORMAL HIGH (ref 8–23)
CO2: 15 mmol/L — ABNORMAL LOW (ref 22–32)
CO2: 18 mmol/L — ABNORMAL LOW (ref 22–32)
CO2: 9 mmol/L — ABNORMAL LOW (ref 22–32)
Calcium: 8.5 mg/dL — ABNORMAL LOW (ref 8.9–10.3)
Calcium: 8 mg/dL — ABNORMAL LOW (ref 8.9–10.3)
Calcium: 8.2 mg/dL — ABNORMAL LOW (ref 8.9–10.3)
Chloride: 106 mmol/L (ref 98–111)
Chloride: 107 mmol/L (ref 98–111)
Chloride: 103 mmol/L (ref 98–111)
Creatinine, Ser: 1.88 mg/dL — ABNORMAL HIGH (ref 0.44–1.00)
Creatinine, Ser: 2.16 mg/dL — ABNORMAL HIGH (ref 0.44–1.00)
Creatinine, Ser: 2.58 mg/dL — ABNORMAL HIGH (ref 0.44–1.00)
GFR, Estimated: 27 mL/min — ABNORMAL LOW (ref >60)
GFR, Estimated: 23 mL/min — ABNORMAL LOW (ref >60)
GFR, Estimated: 19 mL/min — ABNORMAL LOW (ref >60)
Glucose, Bld: 107 mg/dL — ABNORMAL HIGH (ref 70–99)
Glucose, Bld: 101 mg/dL — ABNORMAL HIGH (ref 70–99)
Glucose, Bld: 86 mg/dL (ref 70–99)
Potassium: 5.2 mmol/L — ABNORMAL HIGH (ref 3.5–5.1)
Potassium: 5.5 mmol/L — ABNORMAL HIGH (ref 3.5–5.1)
Potassium: 6.1 mmol/L — ABNORMAL HIGH (ref 3.5–5.1)
Sodium: 138 mmol/L (ref 135–145)
Sodium: 141 mmol/L (ref 135–145)
Sodium: 138 mmol/L (ref 135–145)

## 2022-05-18 LAB — GLUCOSE, CAPILLARY
Glucose-Capillary: 103 mg/dL — ABNORMAL HIGH (ref 70–99)
Glucose-Capillary: 87 mg/dL (ref 70–99)
Glucose-Capillary: 55 mg/dL — ABNORMAL LOW (ref 70–99)
Glucose-Capillary: 110 mg/dL — ABNORMAL HIGH (ref 70–99)
Glucose-Capillary: 86 mg/dL (ref 70–99)
Glucose-Capillary: 73 mg/dL (ref 70–99)

## 2022-05-18 LAB — POCT I-STAT 7, (LYTES, BLD GAS, ICA,H+H)
Acid-base deficit: 10 mmol/L — ABNORMAL HIGH (ref 0.0–2.0)
Acid-base deficit: 11 mmol/L — ABNORMAL HIGH (ref 0.0–2.0)
Acid-base deficit: 15 mmol/L — ABNORMAL HIGH (ref 0.0–2.0)
Acid-base deficit: 17 mmol/L — ABNORMAL HIGH (ref 0.0–2.0)
Acid-base deficit: 9 mmol/L — ABNORMAL HIGH (ref 0.0–2.0)
Bicarbonate: 12.5 mmol/L — ABNORMAL LOW (ref 20.0–28.0)
Bicarbonate: 13 mmol/L — ABNORMAL LOW (ref 20.0–28.0)
Bicarbonate: 16.1 mmol/L — ABNORMAL LOW (ref 20.0–28.0)
Bicarbonate: 16.1 mmol/L — ABNORMAL LOW (ref 20.0–28.0)
Bicarbonate: 17.1 mmol/L — ABNORMAL LOW (ref 20.0–28.0)
Calcium, Ion: 1.09 mmol/L — ABNORMAL LOW (ref 1.15–1.40)
Calcium, Ion: 1.1 mmol/L — ABNORMAL LOW (ref 1.15–1.40)
Calcium, Ion: 1.19 mmol/L (ref 1.15–1.40)
Calcium, Ion: 1.2 mmol/L (ref 1.15–1.40)
Calcium, Ion: 1.21 mmol/L (ref 1.15–1.40)
HCT: 34 % — ABNORMAL LOW (ref 36.0–46.0)
HCT: 37 % (ref 36.0–46.0)
HCT: 42 % (ref 36.0–46.0)
HCT: 42 % (ref 36.0–46.0)
HCT: 44 % (ref 36.0–46.0)
Hemoglobin: 11.6 g/dL — ABNORMAL LOW (ref 12.0–15.0)
Hemoglobin: 12.6 g/dL (ref 12.0–15.0)
Hemoglobin: 14.3 g/dL (ref 12.0–15.0)
Hemoglobin: 14.3 g/dL (ref 12.0–15.0)
Hemoglobin: 15 g/dL (ref 12.0–15.0)
O2 Saturation: 83 %
O2 Saturation: 85 %
O2 Saturation: 89 %
O2 Saturation: 95 %
O2 Saturation: 95 %
Patient temperature: 100.3
Patient temperature: 100.3
Patient temperature: 101.2
Patient temperature: 99
Patient temperature: 99
Potassium: 5.1 mmol/L (ref 3.5–5.1)
Potassium: 5.5 mmol/L — ABNORMAL HIGH (ref 3.5–5.1)
Potassium: 5.9 mmol/L — ABNORMAL HIGH (ref 3.5–5.1)
Potassium: 5.9 mmol/L — ABNORMAL HIGH (ref 3.5–5.1)
Potassium: 5.9 mmol/L — ABNORMAL HIGH (ref 3.5–5.1)
Sodium: 138 mmol/L (ref 135–145)
Sodium: 138 mmol/L (ref 135–145)
Sodium: 138 mmol/L (ref 135–145)
Sodium: 138 mmol/L (ref 135–145)
Sodium: 140 mmol/L (ref 135–145)
TCO2: 13 mmol/L — ABNORMAL LOW (ref 22–32)
TCO2: 14 mmol/L — ABNORMAL LOW (ref 22–32)
TCO2: 17 mmol/L — ABNORMAL LOW (ref 22–32)
TCO2: 17 mmol/L — ABNORMAL LOW (ref 22–32)
TCO2: 18 mmol/L — ABNORMAL LOW (ref 22–32)
pCO2 arterial: 33.8 mmHg (ref 32–48)
pCO2 arterial: 37.8 mmHg (ref 32–48)
pCO2 arterial: 38.1 mmHg (ref 32–48)
pCO2 arterial: 40 mmHg (ref 32–48)
pCO2 arterial: 51.4 mmHg — ABNORMAL HIGH (ref 32–48)
pH, Arterial: 7.016 — CL (ref 7.35–7.45)
pH, Arterial: 7.181 — CL (ref 7.35–7.45)
pH, Arterial: 7.22 — ABNORMAL LOW (ref 7.35–7.45)
pH, Arterial: 7.239 — ABNORMAL LOW (ref 7.35–7.45)
pH, Arterial: 7.261 — ABNORMAL LOW (ref 7.35–7.45)
pO2, Arterial: 59 mmHg — ABNORMAL LOW (ref 83–108)
pO2, Arterial: 66 mmHg — ABNORMAL LOW (ref 83–108)
pO2, Arterial: 74 mmHg — ABNORMAL LOW (ref 83–108)
pO2, Arterial: 97 mmHg (ref 83–108)
pO2, Arterial: 99 mmHg (ref 83–108)

## 2022-05-18 LAB — CBC
HCT: 42.7 % (ref 36.0–46.0)
Hemoglobin: 13.1 g/dL (ref 12.0–15.0)
MCH: 25.9 pg — ABNORMAL LOW (ref 26.0–34.0)
MCHC: 30.7 g/dL (ref 30.0–36.0)
MCV: 84.6 fL (ref 80.0–100.0)
Platelets: 162 10*3/uL (ref 150–400)
RBC: 5.05 MIL/uL (ref 3.87–5.11)
RDW: 17.2 % — ABNORMAL HIGH (ref 11.5–15.5)
WBC: 10.2 10*3/uL (ref 4.0–10.5)
nRBC: 0.5 % — ABNORMAL HIGH (ref 0.0–0.2)

## 2022-05-18 LAB — ECHOCARDIOGRAM COMPLETE
AV Mean grad: 8 mmHg
AV Peak grad: 16.8 mmHg
Ao pk vel: 2.05 m/s
Area-P 1/2: 4.63 cm2
Height: 62 in
S' Lateral: 2.9 cm
Weight: 4148.18 oz

## 2022-05-18 LAB — LACTIC ACID, PLASMA
Lactic Acid, Venous: 6.5 mmol/L (ref 0.5–1.9)
Lactic Acid, Venous: 6.8 mmol/L (ref 0.5–1.9)
Lactic Acid, Venous: >9 mmol/L (ref 0.5–1.9)

## 2022-05-18 LAB — PHOSPHORUS: Phosphorus: 4.7 mg/dL — ABNORMAL HIGH (ref 2.5–4.6)

## 2022-05-18 LAB — MAGNESIUM: Magnesium: 2.2 mg/dL (ref 1.7–2.4)

## 2022-05-18 MED ORDER — STERILE WATER FOR INJECTION IJ SOLN
INTRAMUSCULAR | Status: AC
  Administered 2022-05-18: 10 mL
  Filled 2022-05-18: qty 10

## 2022-05-18 MED ORDER — DEXTROSE 50 % IV SOLN
INTRAVENOUS | Status: AC
  Administered 2022-05-18: 12.5 g via INTRAVENOUS
  Filled 2022-05-18: qty 50

## 2022-05-18 MED ORDER — EPINEPHRINE HCL 5 MG/250ML IV SOLN IN NS
0.5000 ug/min | INTRAVENOUS | Status: DC
  Administered 2022-05-18: 2 ug/min via INTRAVENOUS
  Administered 2022-05-18: 20 ug/min via INTRAVENOUS
  Administered 2022-05-18: 16 ug/min via INTRAVENOUS
  Administered 2022-05-19 (×5): 20 ug/min via INTRAVENOUS
  Filled 2022-05-18 (×3): qty 250
  Filled 2022-05-18: qty 500
  Filled 2022-05-18 (×3): qty 250

## 2022-05-18 MED ORDER — SODIUM ZIRCONIUM CYCLOSILICATE 5 G PO PACK
5.0000 g | PACK | Freq: Once | ORAL | Status: AC
  Administered 2022-05-18: 5 g
  Filled 2022-05-18: qty 1

## 2022-05-18 MED ORDER — HYDROCORTISONE SOD SUC (PF) 100 MG IJ SOLR
100.0000 mg | Freq: Three times a day (TID) | INTRAMUSCULAR | Status: DC
  Administered 2022-05-18 – 2022-05-19 (×5): 100 mg via INTRAVENOUS
  Filled 2022-05-18 (×5): qty 2

## 2022-05-18 MED ORDER — INSULIN ASPART 100 UNIT/ML IV SOLN
5.0000 [IU] | Freq: Once | INTRAVENOUS | Status: AC
  Administered 2022-05-18: 5 [IU] via INTRAVENOUS

## 2022-05-18 MED ORDER — SODIUM BICARBONATE 8.4 % IV SOLN
Freq: Once | INTRAVENOUS | Status: AC
  Filled 2022-05-18: qty 1000

## 2022-05-18 MED ORDER — PERFLUTREN LIPID MICROSPHERE
1.0000 mL | INTRAVENOUS | Status: AC | PRN
  Administered 2022-05-18: 5 mL via INTRAVENOUS

## 2022-05-18 MED ORDER — SODIUM BICARBONATE 8.4 % IV SOLN
100.0000 meq | Freq: Once | INTRAVENOUS | Status: AC
  Administered 2022-05-18: 100 meq via INTRAVENOUS
  Filled 2022-05-18: qty 100

## 2022-05-18 MED ORDER — MIDAZOLAM BOLUS VIA INFUSION: 1.0000 mg | INTRAVENOUS | Status: DC | PRN

## 2022-05-18 MED ORDER — VITAL AF 1.2 CAL PO LIQD: 1000.0000 mL | ORAL | Status: DC

## 2022-05-18 MED ORDER — FENTANYL CITRATE (PF) 2500 MCG/50ML IJ SOLN
25.0000 ug/h | Status: DC
  Administered 2022-05-18: 100 ug/h via INTRAVENOUS
  Filled 2022-05-18: qty 100

## 2022-05-18 MED ORDER — VECURONIUM BROMIDE 10 MG IV SOLR
0.0800 mg/kg | INTRAVENOUS | Status: DC | PRN
  Administered 2022-05-18 (×2): 9.4 mg via INTRAVENOUS
  Filled 2022-05-18 (×2): qty 10

## 2022-05-18 MED ORDER — ALBUMIN HUMAN 25 % IV SOLN
25.0000 g | Freq: Once | INTRAVENOUS | Status: AC
  Administered 2022-05-18: 25 g via INTRAVENOUS
  Filled 2022-05-18: qty 100

## 2022-05-18 MED ORDER — HEPARIN SODIUM (PORCINE) 5000 UNIT/ML IJ SOLN
5000.0000 [IU] | Freq: Three times a day (TID) | INTRAMUSCULAR | Status: DC
  Administered 2022-05-18 – 2022-05-19 (×4): 5000 [IU] via SUBCUTANEOUS
  Filled 2022-05-18 (×4): qty 1

## 2022-05-18 MED ORDER — SODIUM BICARBONATE 8.4 % IV SOLN
50.0000 meq | Freq: Once | INTRAVENOUS | Status: AC
  Administered 2022-05-18: 50 meq via INTRAVENOUS
  Filled 2022-05-18: qty 50

## 2022-05-18 MED ORDER — FENTANYL 2500MCG IN NS 250ML (10MCG/ML) PREMIX INFUSION: 25.0000 ug/h | INTRAVENOUS | Status: DC

## 2022-05-18 MED ORDER — MIDAZOLAM-SODIUM CHLORIDE 100-0.9 MG/100ML-% IV SOLN
2.0000 mg/h | INTRAVENOUS | Status: DC
  Administered 2022-05-18: 2 mg/h via INTRAVENOUS
  Filled 2022-05-18: qty 100

## 2022-05-18 MED ORDER — SODIUM CHLORIDE 0.9 % IV SOLN
2.0000 g | INTRAVENOUS | Status: DC
  Administered 2022-05-19: 2 g via INTRAVENOUS
  Filled 2022-05-18: qty 12.5

## 2022-05-18 MED ORDER — STERILE WATER FOR INJECTION IV SOLN
INTRAVENOUS | Status: DC
  Filled 2022-05-18: qty 1000

## 2022-05-18 MED ORDER — METRONIDAZOLE 500 MG/100ML IV SOLN
500.0000 mg | Freq: Two times a day (BID) | INTRAVENOUS | Status: DC
  Administered 2022-05-18 – 2022-05-19 (×3): 500 mg via INTRAVENOUS
  Filled 2022-05-18 (×3): qty 100

## 2022-05-18 MED ORDER — DEXTROSE 50 % IV SOLN
1.0000 | Freq: Once | INTRAVENOUS | Status: AC
  Administered 2022-05-18: 50 mL via INTRAVENOUS
  Filled 2022-05-18: qty 50

## 2022-05-18 MED ORDER — DEXTROSE 50 % IV SOLN: 12.5000 g | INTRAVENOUS | Status: AC

## 2022-05-18 MED ORDER — FENTANYL CITRATE PF 50 MCG/ML IJ SOSY: 25.0000 ug | PREFILLED_SYRINGE | Freq: Once | INTRAMUSCULAR | Status: DC

## 2022-05-18 MED ORDER — SODIUM BICARBONATE 8.4 % IV SOLN
50.0000 meq | Freq: Once | INTRAVENOUS | Status: AC
  Administered 2022-05-18: 80 meq via INTRAVENOUS
  Filled 2022-05-18: qty 50

## 2022-05-18 MED ORDER — INSULIN GLARGINE-YFGN 100 UNIT/ML ~~LOC~~ SOLN
20.0000 [IU] | Freq: Every day | SUBCUTANEOUS | Status: DC
  Filled 2022-05-18: qty 0.2

## 2022-05-18 MED ORDER — SODIUM BICARBONATE 8.4 % IV SOLN
INTRAVENOUS | Status: DC
  Filled 2022-05-18 (×4): qty 1000

## 2022-05-18 MED ORDER — SODIUM BICARBONATE 8.4 % IV SOLN
100.0000 meq | Freq: Once | INTRAVENOUS | Status: AC
  Administered 2022-05-18: 100 meq via INTRAVENOUS

## 2022-05-18 MED ORDER — PHENYLEPHRINE HCL-NACL 20-0.9 MG/250ML-% IV SOLN
INTRAVENOUS | Status: AC
  Filled 2022-05-18: qty 250

## 2022-05-18 MED ORDER — SODIUM ZIRCONIUM CYCLOSILICATE 10 G PO PACK
10.0000 g | PACK | Freq: Once | ORAL | Status: AC
  Administered 2022-05-18: 10 g
  Filled 2022-05-18: qty 1

## 2022-05-18 MED ORDER — FENTANYL CITRATE (PF) 2500 MCG/50ML IJ SOLN
25.0000 ug/h | Status: DC
  Filled 2022-05-18 (×2): qty 100

## 2022-05-18 MED ORDER — FENTANYL BOLUS VIA INFUSION: 25.0000 ug | INTRAVENOUS | Status: DC | PRN

## 2022-05-18 MED ORDER — ARTIFICIAL TEARS OPHTHALMIC OINT
1.0000 | TOPICAL_OINTMENT | Freq: Three times a day (TID) | OPHTHALMIC | Status: DC
  Administered 2022-05-18 – 2022-05-19 (×3): 1 via OPHTHALMIC
  Filled 2022-05-18: qty 3.5

## 2022-05-18 NOTE — IPAL (Signed)
  Interdisciplinary Goals of Care Family Meeting   Date carried out:: 05/18/2022  Location of the meeting: Unit  Member's involved: Physician and Family Member or next of kin  Durable Power of Attorney or acting medical decision maker: Patient's roommate Grace Short  Discussion: We discussed goals of care for [employer]. Grace Short .  Discussed patient's status, prognosis, current interventions with patient's healthcare power of attorney Grace Short, Grace son and mother in the ICU.  Explained that Ms. Dubow is showing progressive shock, progressive multiorgan failure despite all aggressive interventions.  I have given them my opinion that there is no new intervention to add.  I do not believe she would tolerate even traveling to the CT scanner to evaluate her chest abdomen and pelvis.  Certainly she would not be a candidate for surgery if she did have an intra-abdominal catastrophe.  Further I do not believe she would tolerate CVVH if it were at any point become indicated due to her profound hypotension and pressor needs.  I have explained that it would not be unreasonable to consider transition to comfort care.  They are going to think about this.  In the meantime we did decide together that she would not benefit from CPR or ACLS maneuvers if she continues to decline and develops pulselessness.  We will not do CPR, will not perform defibrillation or cardioversion if she arrests.  Orders placed in the chart to this effect.  Code status: Full DNR  Disposition: Continue current acute care   Time spent for the meeting: 30 minutes  Collene Gobble 05/18/2022, 6:00 PM

## 2022-05-18 NOTE — Progress Notes (Signed)
eLink Physician-Brief Progress Note Patient Name: Grace Short. [name] DOB: 25-Nov-1945 MRN: [id number]   Date of Service  05/18/2022  HPI/Events of Note  Persistent acidosis and hypoxemia despite earlier intervention by adjusting ventilator settings.  eICU Interventions  Bicarb gtt ordered, low threshold for paralyzing the patient on the ventilator.        Kerry Kass Threasa Kinch 05/18/2022, 3:57 AM

## 2022-05-18 NOTE — Progress Notes (Addendum)
Pharmacy Antibiotic Note  Grace Short is a 77 y.o. female admitted on 05/18/2022 with pneumonia. Pharmacy has been consulted for vancomycin and cefepime dosing. Patient extubated 3/11, then reintubated 3/12 and started on multiple vasopressors, with vasopressor requirements increasing overnight. Scr elevated at 2.16 and UOP is very minimal. WBC 10.2. Tmax 103F.   Plan: Adjust Cefepime to 2g IV q24h  Patient received Vancomycin 2000 mg IV x1 on 3/12. Obtain random levels to guide further dosing given unstable renal function  Monitor C&S, renal function, s/sx improvement  Height: '5\' 2"'$  (157.5 cm) Weight: 117.6 kg (259 lb 4.2 oz) IBW/kg (Calculated) : 50.1  Temp (24hrs), Avg:100.5 F (38.1 C), Min:99 F (37.2 C), Max:103 F (39.4 C)  Recent Labs  Lab 05/20/2022 0945 05/12/22 0035 05/15/22 0128 05/16/22 0230 05/17/22 0200 05/17/22 1228 05/17/22 1411 05/17/22 1715 05/17/22 1746 05/18/22 0314 05/18/22 0822 05/18/22 0857  WBC  --    < > 8.0 7.9 5.5 7.4  --   --   --  10.2  --   --   CREATININE  --    < > 0.62 0.62 0.77  --  1.48*  --  1.64* 1.88*  --  2.16*  LATICACIDVEN 1.0  --   --   --   --  4.9*  --  4.0*  --   --  6.5*  --    < > = values in this interval not displayed.     Estimated Creatinine Clearance: 26.5 mL/min (A) (by C-G formula based on SCr of 2.16 mg/dL (H)).    Allergies  Allergen Reactions   Ativan [Lorazepam] Other (See Comments)    "Stopped breathing"    Antimicrobials this admission: Vancomycin 3/6 x 1, 3/12>> Flagyl 3/6 x 1 Cefepime 3/6 > 3/7, 3/12 >>  Ceftriaxone 3/7 >> 3/10   Microbiology results: 3/6 bcx - ngtd 3/6 sputum cx - rare candida albicans, MSSA 3/6 rvp - metapneumovirus  3/12 bcx - ngtd 3/12 respiratory cx - rare gpc in pairs   Eliseo Gum, PharmD PGY1 Pharmacy Resident   05/18/2022  10:39 AM

## 2022-05-18 NOTE — Progress Notes (Addendum)
NAME:  Grace Short, MRN:  [id number], DOB:  [birth year], LOS: 7 ADMISSION DATE:  05/15/2022, CONSULTATION DATE:  05/12/2022 REFERRING MD:  Dr. Tamala Julian, CHIEF COMPLAINT:  AMS, Sepsis   History of Present Illness:  Grace Short is a 77 y.o. F with PMH of HTN, HL, CHF, COPD on 2L home O2, seizures, chronic lymphedema who was noted to be altered with labored breathing at SNF on the day of admission. EMS was called and pt was 84% on 2L.  She was brought to the ED where she was placed on non-rebreather and fever up to 101.6, she was noted to be hypercarbic on ABG so transitioned to Bipap.  She had some shaking up the upper extremities so EEG was ordered. She underwent CT of the chest /abd/pelvis showing RUL PNA.  Labs significant for normal lactic acid, covid, flu and RSV negative, no leukocytosis, BNP and troponin WNL and procalcitonin <0.1.   She was treated with cefepime, vancomycin and flagyl and admitted to the hospital medicine service, however had repeated ABG's showing respiratory acidosis and AMS, so PCCM consulted   Pertinent  Medical History  has a past medical history of Chronic acquired lymphedema, Chronic heart failure with preserved ejection fraction (HFpEF) ([place]), COPD (chronic obstructive pulmonary disease) ([place]), CVA (cerebral vascular accident) ([place]), DM (diabetes mellitus) ([place]), HLD (hyperlipidemia), Hypertension, Morbid obesity with BMI of 50.0-59.9, adult ([place]), Partial seizure disorder ([place]), and SSS (sick sinus syndrome) ([place]).   Significant Hospital Events: Including procedures, antibiotic start and stop dates in addition to other pertinent events   3/5 presented to the ED from snf with AMS and hypoxia 3/6 persistent respiratory acidosis, PCCM consult, on bipap 3/7 Intubated 3/9 - agitation 3/10 - remains agitated 3/11 - extubated 3/12 - re-intubated d/t excess secretions, unable to clear 3/13 - need for four vasopressors  Interim History / Subjective:  Yesterday  patient  Objective   Blood pressure (!) 78/36, pulse (!) 190, temperature 100.2 F (37.9 C), temperature source Oral, resp. rate (!) 30, height '5\' 2"'$  (1.575 m), weight 117.6 kg, SpO2 92 %.    Vent Mode: PRVC FiO2 (%):  [50 %-100 %] 100 % Set Rate:  [24 bmp-30 bmp] 30 bmp Vt Set:  [400 mL] 400 mL PEEP:  [5 cmH20-12 cmH20] 12 cmH20 Plateau Pressure:  [33 cmH20-35 cmH20] 35 cmH20  Peak 20  Intake/Output Summary (Last 24 hours) at 05/18/2022 1056 Last data filed at 05/18/2022 1000 Gross per 24 hour  Intake 11484.48 ml  Output 105 ml  Net 11379.48 ml    Filed Weights   05/16/22 2344 05/17/22 0500 05/18/22 0350  Weight: 114.5 kg 114.5 kg 117.6 kg   Examination: General: Chronically ill-appearing, no acute distress HENT: NCAT. Moist mucous membranes, ET in place Respiratory: On ventilatory support. Wheezing, rhonchi appreciated throughout.  Cardiovascular: Regular rate, rhythm. No murmurs. GI: Hard, mildly distended, but not tympanic. No bowel sounds appreciated. Extremities: Chronic lymphedema with wraps in place. Feet and hands are cold to touch, no palpable pulses appreciated.  Neuro: Somnolent, but awake, follows commands  Resolved Hospital Problem list   Hypernatremia  Assessment & Plan:  #Septic shock #Multi-organ failure #Lactic acidosis Yesterday patient had increasing pressure needs after having to be re-intubated early in the morning. Overnight patient's Levo was much higher than charted. This has been corrected and the patient is now maximized out on three vasopressors, so epinephrine was initiated. Lactate has increased to 6 this morning and latest ABG with pH 7.1. We have  started bicarb gtt for this. Urine output has also decreased over the last 24 hours. Blood cultures negative thus far. She is certainly in shock at this point, most likely underlying infection with a recent Tmax of 103F, white count elevated. With her abdomen and worsening lactate, I am also concerned  for ischemic bowel, we have a STAT CT ordered for further evaluation. Bedside Echo today difficult given body habitus, but it does not appear to be cardiogenic shock. I have relayed this information to the patient's POA and discussed severity of her current condition. She still desires FULL CODE at this time and is planning to come to see the patient today give poor prognosis.  - Continue vasopressors as needed - Continue vancomycin, cefepime  - Stress-dose steroids with IV hydrocortisone '100mg'$  every 8 hours - Start bicarb gtt 100cc/hr - S/p albumin to maintain renal perfusion - Follow-up blood cultures - CT chest/abdomen/pelvis w/o contrast - Trend lactate  - Continue palliative conversations   #Acute hypoxemic and hypercapnic respiratory failure #Pneumomediastinum  #Metapneumovirus infection Repeat chest x-ray this morning without clear pneumomediastinum present, improved from yesterday. No new large infiltrates or consolidations which would explain worsening septic shock.  - Continue bronchodilator treatments - Lung protective strategies  - VAP bundle, PPI - Precedex, fentanyl gtt  #Oliguric acute renal failure  #Anion gap metabolic acidosis #Hyperkalemia Minimal urine output over the last 24h. Renal function appears to be worsening, most likely due to ATN given significant hypotension. AGMA likely related to lactic acidosis, renal failure. Potassium elevated to 5.5, will give lokelma and re-check BMP this afternoon.  - Continue bicarb drip 100cc/hr - Maintain adequate renal perfusion - Switch to prophylactic heparin from [facility] - Repeat BMP this afternoon - Strict I/O  #Type 2 diabetes Overnight patient had episode of vomiting and tube feeds were stopped. This morning CBG at 55, we will give amp of dextrose and hold further insulin.  - Hold tube feeds - Hold insulin   #Lymphedema -Continue Unna boots w/ twice twice weekly changing -Per WOC  #History of partial seizures - On  Keppra  Best Practice (right click and "Reselect all SmartList Selections" daily)   Diet/type: holding tubefeeds DVT prophylaxis: prophylactic heparin  GI prophylaxis: PPI Lines: Central line Foley:  Yes, and it is still needed Code Status:  full code Last date of multidisciplinary goals of care discussion: discussed with POA this AM  Labs   CBC: Recent Labs  Lab 05/15/22 0128 05/16/22 0230 05/17/22 0200 05/17/22 0239 05/17/22 1228 05/18/22 0025 05/18/22 0314 05/18/22 0319 05/18/22 0423 05/18/22 1050  WBC 8.0 7.9 5.5  --  7.4  --  10.2  --   --   --   HGB 11.5* 11.4* 14.7   < > 12.0 15.0 13.1 14.3 14.3 12.6  HCT 37.5 38.1 48.9*   < > 39.2 44.0 42.7 42.0 42.0 37.0  MCV 83.0 85.8 84.3  --  83.8  --  84.6  --   --   --   PLT 127* 129* 141*  --  137*  --  162  --   --   --    < > = values in this interval not displayed.     Basic Metabolic Panel: Recent Labs  Lab 05/13/22 1633 05/15/22 0128 05/16/22 0230 05/17/22 0200 05/17/22 0239 05/17/22 1411 05/17/22 1746 05/18/22 0025 05/18/22 0314 05/18/22 0319 05/18/22 0423 05/18/22 0857 05/18/22 1050  NA  --  152* 149* 146*   < > 140 138   < >  138 138 138 141 138  K  --  3.7 4.4 4.2   < > 5.9* 4.5   < > 5.2* 5.9* 5.9* 5.5* 5.9*  CL  --  119* 115* 114*  --  107 104  --  106  --   --  107  --   CO2  --  '24 25 23  '$ --  20* 18*  --  15*  --   --  18*  --   GLUCOSE  --  178* 177* 111*  --  200* 197*  --  107*  --   --  101*  --   BUN  --  51* 43* 35*  --  43* 43*  --  46*  --   --  49*  --   CREATININE  --  0.62 0.62 0.77  --  1.48* 1.64*  --  1.88*  --   --  2.16*  --   CALCIUM  --  8.9 8.8* 9.0  --  8.1* 8.3*  --  8.5*  --   --  8.0*  --   MG 2.3 2.2 2.0 2.1  --   --   --   --  2.2  --   --   --   --   PHOS 2.7 2.3* 2.8 2.7  --   --   --   --  4.7*  --   --   --   --    < > = values in this interval not displayed.    GFR: Estimated Creatinine Clearance: 26.5 mL/min (A) (by C-G formula based on SCr of 2.16 mg/dL  (H)). Recent Labs  Lab 05/16/22 0230 05/17/22 0200 05/17/22 1228 05/17/22 1715 05/18/22 0314 05/18/22 0822  WBC 7.9 5.5 7.4  --  10.2  --   LATICACIDVEN  --   --  4.9* 4.0*  --  6.5*     Liver Function Tests: No results for input(s): "AST", "ALT", "ALKPHOS", "BILITOT", "PROT", "ALBUMIN" in the last 168 hours.  No results for input(s): "LIPASE", "AMYLASE" in the last 168 hours. No results for input(s): "AMMONIA" in the last 168 hours.   ABG    Component Value Date/Time   PHART 7.181 (LL) 05/18/2022 1050   PCO2ART 33.8 05/18/2022 1050   PO2ART 99 05/18/2022 1050   HCO3 12.5 (L) 05/18/2022 1050   TCO2 13 (L) 05/18/2022 1050   ACIDBASEDEF 15.0 (H) 05/18/2022 1050   O2SAT 95 05/18/2022 1050     Coagulation Profile: No results for input(s): "INR", "PROTIME" in the last 168 hours.   Cardiac Enzymes: No results for input(s): "CKTOTAL", "CKMB", "CKMBINDEX", "TROPONINI" in the last 168 hours.  HbA1C: Hgb A1c MFr Bld  Date/Time Value Ref Range Status  05/22/2022 10:57 PM 7.3 (H) 4.8 - 5.6 % Final    Comment:    (NOTE)         Prediabetes: 5.7 - 6.4         Diabetes: >6.4         Glycemic control for adults with diabetes: <7.0   09/07/2020 05:35 AM 6.8 (H) 4.8 - 5.6 % Final    Comment:    (NOTE)         Prediabetes: 5.7 - 6.4         Diabetes: >6.4         Glycemic control for adults with diabetes: <7.0     CBG: Recent Labs  Lab 05/17/22 1547 05/17/22 1932 05/17/22 2317 05/18/22  0320 05/18/22 0731  GLUCAP 151* 162* 131* 103* 87     Review of Systems:     Past Medical History:  She,  has a past medical history of Chronic acquired lymphedema, Chronic heart failure with preserved ejection fraction (HFpEF) ([facility]), COPD (chronic obstructive pulmonary disease) ([place]), CVA (cerebral vascular accident) ([place]), DM (diabetes mellitus) ([place]), HLD (hyperlipidemia), Hypertension, Morbid obesity with BMI of 50.0-59.9, adult ([place]), Partial seizure disorder ([place]), and SSS  (sick sinus syndrome) ([place]).   Surgical History:   Past Surgical History:  Procedure Laterality Date   APPENDECTOMY     BREAST SURGERY     colectomy     rectal surgery   HERNIA REPAIR     PACEMAKER IMPLANT     TONSILLECTOMY       Social History:   reports that she has never smoked. She has never used smokeless tobacco. She reports that she does not drink alcohol and does not use drugs.   Family History:  Her family history includes Diabetes in her sister; Heart disease in her sister.   Allergies Allergies  Allergen Reactions   Ativan [Lorazepam] Other (See Comments)    "Stopped breathing"    Sanjuan Dame, MD Internal Medicine PGY-3 Pager: [phone]

## 2022-05-18 NOTE — Progress Notes (Signed)
eLink Physician-Brief Progress Note Patient Name: Grace Short. [name] DOB: May 02, 1945 MRN: [id number]   Date of Service  05/18/2022  HPI/Events of Note  Patient's abdomen is distended, + metabolic acidosis.  eICU Interventions  Stat KUB ordered,  Sodium bicarbonate 50 meq iv x 1 ordered.        Frederik Pear 05/18/2022, 4:38 AM

## 2022-05-18 NOTE — Procedures (Signed)
Cortrak  Tube Type:  Cortrak - 43 inches Tube Location:  Left nare Secured by: Bridle Technique Used to Measure Tube Placement:  Marking at nare/corner of mouth Cortrak Secured At:  60 cm   Cortrak Tube Team Note:  Consult received to place a Cortrak feeding tube.   X-ray is required, abdominal x-ray has been ordered by the Cortrak team. Please confirm tube placement before using the Cortrak tube.   If the tube becomes dislodged please keep the tube and contact the Cortrak team at [email or web address] for replacement.  If after hours and replacement cannot be delayed, place a NG tube and confirm placement with an abdominal x-ray.    Koleen Distance MS, RD, LDN Please refer to [facility] for RD and/or RD on-call/weekend/after hours pager

## 2022-05-18 NOTE — Progress Notes (Signed)
eLink Physician-Brief Progress Note Patient Name: Grace Short. [name] DOB: 1946-01-04 MRN: [id number]   Date of Service  05/18/2022  HPI/Events of Note  K+ 6.1  eICU Interventions  Hyperkalemia treatment protocol ordered.        Frederik Pear 05/18/2022, 10:55 PM

## 2022-05-18 NOTE — Progress Notes (Signed)
eLink Physician-Brief Progress Note Patient Name: Grace Short. Winkle DOB: 1946/01/13 MRN: [id number]   Date of Service  05/18/2022  HPI/Events of Note  ABG reviewed.  eICU Interventions  FiO2 increased to 70 %, PEEP increased to 12, rate increased to 30, will also give 1 amp of Bicarb.        Kerry Kass Tavin Vernet 05/18/2022, 12:46 AM

## 2022-05-18 NOTE — Progress Notes (Signed)
eLink Physician-Brief Progress Note Patient Name: Grace Short. Aseltine DOB: 1945-10-14 MRN: [id number]   Date of Service  05/18/2022  HPI/Events of Note  KUB and CXR reviewed.  eICU Interventions          Frederik Pear 05/18/2022, 6:11 AM

## 2022-05-18 NOTE — Progress Notes (Signed)
Nutrition Follow-up  DOCUMENTATION CODES:   Morbid obesity  INTERVENTION:  When pt stable, resume TF via cortrak tube.  Vital AF at 73m/h (1.32L/d) Prosource TF 1x/d  Free water 2019mq4h per MD This will provide 1664kcal, 119g of protein, and 10717mf free water (2271m11m+flush) Discontinue banatrol - no BM x 2 days  NUTRITION DIAGNOSIS:  Inadequate oral intake related to inability to eat as evidenced by NPO status. - remains applicable  GOAL:  Provide needs based on ASPEN/SCCM guidelines - TF infusing at goal  MONITOR:  TF tolerance  REASON FOR ASSESSMENT:  Consult, Ventilator Enteral/tube feeding initiation and management  ASSESSMENT:   77 y28. female admits related to AMS and SOB. PMH includes: chronic acquired lymphedema, HFpEF, COPD, CVA, DM, HLD, HTN, SSS. Pt is currently receiving medical management related to chronic respiratory failure with hypoxia and hypercapnia.  3/7 - intubation 3/11 - extubation, cortrak tube placed (gastric fundus) 3/12 - intubated  Pt resting in bed at the time of assessment, intubated and sedated. Requiring significant pressor support this AM. Pt was placed supine early this AM for an XR and vomited (despite TF being on hold). Attempted to advance cortrak tube to the distal stomach, but unable to advance - will need to be sent to fluoroscopy if further advancement is needed.   Would not recommend restarting enteral feeds until pt is more hemodynamically stable.   Intake/Output Summary (Last 24 hours) at 05/18/2022 0902 Last data filed at 05/18/2022 0800 Gross per 24 hour  Intake 11300.5 ml  Output 130 ml  Net 11170.5 ml   Net IO Since Admission: 18,429.99 mL [05/18/22 0902]   Nutritionally Relevant Medications: Scheduled Meds:  docusate  100 mg Per Tube BID   PROSource TF20  60 mL Per Tube Daily   BANATROL TF  60 mL Per Tube BID   hydrocortisone sod succinate (SOLU-CORTEF) inj  100 mg Intravenous Q8H   insulin aspart  0-20  Units Subcutaneous Q4H   insulin aspart  8 Units Subcutaneous Q4H   insulin glargine-yfgn  20 Units Subcutaneous Daily   pantoprazole (PROTONIX) IV  40 mg Intravenous Q24H   polyethylene glycol  17 g Per Tube Daily   vancomycin    Does not apply See admin instructions   Continuous Infusions:  sodium chloride 70 mcg/hr (05/17/22 2013)   ceFEPime (MAXIPIME) IV 2 g (05/18/22 0841)   feeding supplement (VITAL AF 1.2 CAL)     norepinephrine (LEVOPHED) Adult infusion 40 mcg/min (05/18/22 0800)   phenylephrine (NEO-SYNEPHRINE) Adult infusion 20 mcg/min (05/18/22 0806)   vasopressin 0.03 Units/min (05/18/22 0800)   Labs Reviewed: K 5.9 BUN 46, creatinine 1.88 CBG ranges from 87-167 mg/dL over the last 24 hours HgbA1c 7.3%  NUTRITION - FOCUSED PHYSICAL EXAM: Flowsheet Row Most Recent Value  Orbital Region No depletion  Upper Arm Region No depletion  Thoracic and Lumbar Region No depletion  Buccal Region No depletion  Temple Region No depletion  Clavicle Bone Region No depletion  Clavicle and Acromion Bone Region No depletion  Scapular Bone Region No depletion  Dorsal Hand No depletion  Patellar Region No depletion  Anterior Thigh Region No depletion  Posterior Calf Region No depletion  Edema (RD Assessment) Mild  Hair Reviewed  Eyes Unable to assess  Mouth Unable to assess  Skin Reviewed  Nails Reviewed    Diet Order:   Diet Order             Diet NPO time specified  Diet  effective now                   EDUCATION NEEDS:  Not appropriate for education at this time  Skin:  Skin Assessment: Reviewed RN Assessment Skin tear to the medial buttocks (3 cm x 2 cm)  Last BM:  3/10 - type 7  Height:  Ht Readings from Last 1 Encounters:  05/12/22 '5\' 2"'$  (1.575 m)    Weight:  Wt Readings from Last 1 Encounters:  05/18/22 117.6 kg    Ideal Body Weight:  50 kg  BMI:  Body mass index is 47.42 kg/m.  Estimated Nutritional Needs:  Kcal:  1700-1900  kcal//d Protein:  100-125 gm Fluid:  1.5L/d    Ranell Patrick, RD, LDN Clinical Dietitian RD pager # available in [place]  After hours/weekend pager # available in [facility]

## 2022-05-18 NOTE — Progress Notes (Signed)
  Echocardiogram 2D Echocardiogram has been performed.  Grace Short 05/18/2022, 5:34 PM

## 2022-05-19 DIAGNOSIS — J9621 Acute and chronic respiratory failure with hypoxia: Secondary | ICD-10-CM | POA: Diagnosis not present

## 2022-05-19 DIAGNOSIS — J9622 Acute and chronic respiratory failure with hypercapnia: Secondary | ICD-10-CM | POA: Diagnosis not present

## 2022-05-19 LAB — GLUCOSE, CAPILLARY
Glucose-Capillary: 122 mg/dL — ABNORMAL HIGH (ref 70–99)
Glucose-Capillary: 130 mg/dL — ABNORMAL HIGH (ref 70–99)
Glucose-Capillary: 62 mg/dL — ABNORMAL LOW (ref 70–99)
Glucose-Capillary: 96 mg/dL (ref 70–99)

## 2022-05-19 LAB — BASIC METABOLIC PANEL
Anion gap: 29 — ABNORMAL HIGH (ref 5–15)
BUN: 54 mg/dL — ABNORMAL HIGH (ref 8–23)
CO2: 8 mmol/L — ABNORMAL LOW (ref 22–32)
Calcium: 8.2 mg/dL — ABNORMAL LOW (ref 8.9–10.3)
Chloride: 101 mmol/L (ref 98–111)
Creatinine, Ser: 2.79 mg/dL — ABNORMAL HIGH (ref 0.44–1.00)
GFR, Estimated: 17 mL/min — ABNORMAL LOW (ref >60)
Glucose, Bld: 136 mg/dL — ABNORMAL HIGH (ref 70–99)
Potassium: 5.6 mmol/L — ABNORMAL HIGH (ref 3.5–5.1)
Sodium: 138 mmol/L (ref 135–145)

## 2022-05-19 LAB — POTASSIUM: Potassium: 5.5 mmol/L — ABNORMAL HIGH (ref 3.5–5.1)

## 2022-05-19 LAB — VANCOMYCIN, RANDOM: Vancomycin Rm: 17 ug/mL

## 2022-05-19 LAB — LACTIC ACID, PLASMA: Lactic Acid, Venous: >9 mmol/L (ref 0.5–1.9)

## 2022-05-19 MED ORDER — GLYCOPYRROLATE 1 MG PO TABS: 1.0000 mg | ORAL_TABLET | ORAL | Status: DC | PRN

## 2022-05-19 MED ORDER — SODIUM CHLORIDE 0.9 % IV SOLN: INTRAVENOUS | Status: DC

## 2022-05-19 MED ORDER — LEVETIRACETAM 100 MG/ML PO SOLN: 500.0000 mg | Freq: Two times a day (BID) | ORAL | Status: DC

## 2022-05-19 MED ORDER — ACETAMINOPHEN 650 MG RE SUPP: 650.0000 mg | Freq: Four times a day (QID) | RECTAL | Status: DC | PRN

## 2022-05-19 MED ORDER — GLYCOPYRROLATE 0.2 MG/ML IJ SOLN: 0.2000 mg | INTRAMUSCULAR | Status: DC | PRN

## 2022-05-19 MED ORDER — POLYVINYL ALCOHOL 1.4 % OP SOLN: 1.0000 [drp] | Freq: Four times a day (QID) | OPHTHALMIC | Status: DC | PRN

## 2022-05-19 MED ORDER — GLYCOPYRROLATE 0.2 MG/ML IJ SOLN
0.2000 mg | INTRAMUSCULAR | Status: DC | PRN
  Administered 2022-05-19: 0.2 mg via INTRAVENOUS
  Filled 2022-05-19: qty 1

## 2022-05-19 MED ORDER — ACETAMINOPHEN 325 MG PO TABS: 650.0000 mg | ORAL_TABLET | Freq: Four times a day (QID) | ORAL | Status: DC | PRN

## 2022-05-20 LAB — CULTURE, RESPIRATORY W GRAM STAIN

## 2022-05-22 LAB — CULTURE, BLOOD (ROUTINE X 2)
Culture: NO GROWTH
Culture: NO GROWTH

## 2022-05-26 DIAGNOSIS — J189 Pneumonia, unspecified organism: Secondary | ICD-10-CM | POA: Diagnosis not present

## 2022-05-26 DIAGNOSIS — E871 Hypo-osmolality and hyponatremia: Secondary | ICD-10-CM | POA: Diagnosis not present

## 2022-05-26 DIAGNOSIS — J123 Human metapneumovirus pneumonia: Secondary | ICD-10-CM | POA: Diagnosis present

## 2022-05-26 DIAGNOSIS — J982 Interstitial emphysema: Secondary | ICD-10-CM | POA: Diagnosis not present

## 2022-05-26 DIAGNOSIS — N179 Acute kidney failure, unspecified: Secondary | ICD-10-CM | POA: Diagnosis not present

## 2022-06-06 NOTE — Progress Notes (Signed)
Pt was extubated to RA 21% FI02 per Dr's order.

## 2022-06-06 NOTE — Death Summary Note (Signed)
DEATH SUMMARY   Patient Details  Name: Grace Short MRN: [id number] DOB: Jul 08, 1945  Admission/Discharge Information   Admit Date:  15-May-2022  Date of Death: Date of Death: 05-23-2022  Time of Death: Time of Death: Jun 09, 1953  Length of Stay: 8  Referring Physician: Fanny Dance, MD   Reason(s) for Hospitalization  Acute on chronic respiratory failure with hypoxia and hypercapnia  Diagnoses  Preliminary cause of death:  Septic shock due to pneumonia  Secondary Diagnoses (including complications and co-morbidities):  Principal Problem:   Septic shock ([place]) Active Problems:   Chronic diastolic CHF (congestive heart failure) ([place])   Stasis edema with ulcer and inflammation, bilateral ([facility])   SSS (sick sinus syndrome) ([place])   HTN (hypertension)   COPD with acute exacerbation ([place])   Lymphedema   Type 2 diabetes mellitus with hyperlipidemia ([place])   Morbid obesity with BMI of 50.0-59.9, adult ([place])   Pacemaker   Seizure disorder ([place])   Acute on chronic respiratory failure with hypoxia and hypercapnia ([place])   Sepsis ([place])   Acute metabolic encephalopathy   Altered mental status   Human metapneumovirus pneumonia   Pneumomediastinum ([place])   Acute renal failure (ARF) ([place])   HCAP (healthcare-associated pneumonia)   Hyponatremia   Brief Hospital Course (including significant findings, care, treatment, and services provided and events leading to death)  Grace Short is a 77 y.o. year old female with history of obesity, hypertension with diastolic CHF, COPD with chronic hypoxemic respiratory failure, chronic lymphedema, partial seizure disorder.  She was admitted 2022/05/15 with hypoxemia and hypercapnia, acute on chronic respiratory failure.  Found to have metapneumovirus pneumonia/pneumonitis.  Course complicated by superimposed right upper lobe bacterial pneumonia which was treated with broad-spectrum antibiotics.  She was on and off mechanical ventilation multiple times due  to her marginal condition and the need for respiratory support.  Extubated on 3/11 and then quickly reintubated on 3/12.  Subsequent imaging showed pneumomediastinum without any evidence of pneumothorax.  She developed profound shock, consistent with septic shock requiring escalating doses of vasopressors.  Her home antihypertensive regimen was held.  With regard to her diabetes she was continued on sliding scale insulin.  She is also kept on Keppra for her history of partial seizures.  Despite all aggressive care including treatment of her pneumonias she remained hypotensive.  She evolved acute renal failure.  It was clear that she was not a good candidate for either continuous or intermittent hemodialysis given her hemodynamic instability.  Discussions were undertaken with the patient's family regarding her poor prognosis for meaningful recovery.  Decision was made to transition to comfort care.  This was done on 05-23-22.  She expired on the same day.    Pertinent Labs and Studies  Significant Diagnostic Studies ECHOCARDIOGRAM COMPLETE  Result Date: 05/18/2022    ECHOCARDIOGRAM REPORT   Patient Name:   Grace Short Date of Exam: 05/18/2022 Medical Rec #:  [id number]     Height:       62.0 in Accession #:    [id number]    Weight:       259.3 lb Date of Birth:  [birth year]     BSA:          2.135 m Patient Age:    77 years      BP:           74/64 mmHg Patient Gender: F             HR:  60 bpm. Exam Location:  Inpatient Procedure: 2D Echo, Intracardiac Opacification Agent, Color Doppler and Cardiac            Doppler STAT ECHO Indications:    dyspnea  History:        Patient has no prior history of Echocardiogram examinations.                 CHF, COPD, Arrythmias:sick sinus syndrome, Signs/Symptoms:Edema;                 Risk Factors:Hypertension, Diabetes and Sleep Apnea.  Sonographer:    Johny Chess RDCS Referring Phys: [place]  Sonographer Comments: Patient is obese and echo  performed with patient supine and on artificial respirator. Image acquisition challenging due to patient body habitus. IMPRESSIONS  1. Left ventricular ejection fraction, by estimation, is 55 to 60%. The left ventricle has normal function. The left ventricle has no regional wall motion abnormalities. There is mild left ventricular hypertrophy. Left ventricular diastolic parameters are indeterminate.  2. Right ventricular systolic function is mildly reduced. The right ventricular size is normal. Tricuspid regurgitation signal is inadequate for assessing PA pressure.  3. The mitral valve is grossly normal. No evidence of mitral valve regurgitation.  4. The aortic valve is grossly normal. Aortic valve regurgitation is not visualized. No aortic stenosis is present.  5. Atrial motion suggests underlying rhythm is atrial flutter. FINDINGS  Left Ventricle: Left ventricular ejection fraction, by estimation, is 55 to 60%. The left ventricle has normal function. The left ventricle has no regional wall motion abnormalities. The left ventricular internal cavity size was normal in size. There is  mild left ventricular hypertrophy. Left ventricular diastolic parameters are indeterminate. Right Ventricle: The right ventricular size is normal. No increase in right ventricular wall thickness. Right ventricular systolic function is mildly reduced. Tricuspid regurgitation signal is inadequate for assessing PA pressure. Left Atrium: Atrial motion suggests underlying rhythm is atrial flutter. Left atrial size was normal in size. Right Atrium: Right atrial size was not well visualized. Pericardium: There is no evidence of pericardial effusion. Mitral Valve: The mitral valve is grossly normal. No evidence of mitral valve regurgitation. Tricuspid Valve: The tricuspid valve is grossly normal. Tricuspid valve regurgitation is not demonstrated. Aortic Valve: The aortic valve is grossly normal. Aortic valve regurgitation is not visualized. No  aortic stenosis is present. Aortic valve mean gradient measures 8.0 mmHg. Aortic valve peak gradient measures 16.8 mmHg. Pulmonic Valve: The pulmonic valve was not well visualized. Pulmonic valve regurgitation is mild. Aorta: The aortic root is normal in size and structure. IAS/Shunts: The interatrial septum was not well visualized.  LEFT VENTRICLE PLAX 2D LVIDd:         4.00 cm LVIDs:         2.90 cm LV PW:         1.10 cm LV IVS:        1.10 cm LVOT diam:     1.90 cm LVOT Area:     2.84 cm  LEFT ATRIUM             Index LA diam:        2.80 cm 1.31 cm/m LA Vol (A2C):   26.8 ml 12.56 ml/m LA Vol (A4C):   34.7 ml 16.26 ml/m LA Biplane Vol: 33.2 ml 15.55 ml/m  AORTIC VALVE AV Vmax:      205.00 cm/s AV Vmean:     130.000 cm/s AV VTI:  0.283 m AV Peak Grad: 16.8 mmHg AV Mean Grad: 8.0 mmHg  AORTA Ao Root diam: 2.80 cm Ao Asc diam:  3.00 cm MITRAL VALVE MV Area (PHT): 4.63 cm    SHUNTS MV Decel Time: 164 msec    Systemic Diam: 1.90 cm MV E velocity: 85.60 cm/s  Cherlynn Kaiser MD Electronically signed by Cherlynn Kaiser MD Signature Date/Time: 05/18/2022/6:15:21 PM    Final    DG CHEST PORT 1 VIEW  Result Date: 05/18/2022 CLINICAL DATA:  Dyspnea. EXAM: PORTABLE CHEST 1 VIEW COMPARISON:  05/18/2022 at 0453 hours. FINDINGS: 1605 hours. Endotracheal tube tip projects over the midthoracic trachea. Feeding tube courses below the diaphragm, beyond the field of view. Unchanged left IJ approach central venous catheter with tip projecting over the upper SVC. Left chest dual-chamber pacemaker with leads projecting over the right atrium and ventricle. Low lung volumes accentuate the pulmonary vasculature and cardiomediastinal silhouette. Unchanged left basilar consolidation. Increased opacity in the right lung base, likely atelectasis. No pneumothorax. IMPRESSION: 1. Stable support apparatus. 2. Increased opacity in the right lung base, likely atelectasis. Unchanged left basilar consolidation. Electronically Signed    By: Emmit Alexanders M.D.   On: 05/18/2022 16:20   DG Abd Portable 1V  Result Date: 05/18/2022 CLINICAL DATA:  Feeding tube placement. EXAM: PORTABLE ABDOMEN - 1 VIEW COMPARISON:  Earlier film, same date. FINDINGS: The feeding tube tip is in the fundal region of the stomach just below the GE junction and could be advanced several cm. The bowel gas pattern is unremarkable. IMPRESSION: Feeding tube tip is in the fundal region of the stomach and could be advanced several cm. Electronically Signed   By: Marijo Sanes M.D.   On: 05/18/2022 13:16   DG CHEST PORT 1 VIEW  Result Date: 05/18/2022 CLINICAL DATA:  Pneumomediastinum EXAM: PORTABLE CHEST 1 VIEW COMPARISON:  Portable exam 0453 hours compared to 05/17/2022 FINDINGS: Tip of endotracheal tube projects 3.0 cm above carina. Feeding tube extends into stomach. LEFT jugular line tip projects over SVC. LEFT subclavian sequential transvenous pacemaker leads project at RIGHT atrium and RIGHT ventricle. Enlargement of cardiac silhouette with normal pulmonary vascularity. No definite pneumomediastinum though small amount of soft tissue gas remains in the LEFT cervical region. Bibasilar atelectasis versus consolidation persists. Subsegmental atelectasis RIGHT upper lobe. Probable small bibasilar effusions without pneumothorax. IMPRESSION: No definite pneumomediastinum. Atelectasis versus consolidation in both lower lungs with probable small BILATERAL pleural effusions. Electronically Signed   By: Lavonia Dana M.D.   On: 05/18/2022 07:26   DG Abd 1 View  Result Date: 05/18/2022 CLINICAL DATA:  Encounter for ileus. EXAM: ABDOMEN - 1 VIEW COMPARISON:  05/16/2022 FINDINGS: Exam detail is significantly diminished due to patient body habitus resulting in suboptimal positioning and penetration. There is a feeding tube identified with tip below the GE junction in the expected location of the proximal stomach. Decreased gaseous distension of the colon. Dilated bowel loop  within the left lower quadrant of the abdomen measures approximately 7.5 cm. This is compared with 14.8 previously. IMPRESSION: 1. Significantly diminished exam detail due to patient body habitus and penetration. 2. Decreased gaseous distension of the colon. 3. Feeding tube tip below the level of the GE junction. Electronically Signed   By: Kerby Moors M.D.   On: 05/18/2022 05:48   DG CHEST PORT 1 VIEW  Result Date: 05/17/2022 CLINICAL DATA:  Pneumomediastinum EXAM: PORTABLE CHEST 1 VIEW COMPARISON:  Radiographs 05/17/2022 at 11:59 a.m. FINDINGS: Left chest wall pacemaker. Subdiaphragmatic feeding tube.  Left IJ CVC tip in the mid SVC. Enteric tube tip in the mid intrathoracic trachea proximally 3.2 cm from the carina. Stable cardiomegaly. No definite pneumomediastinum. Pulmonary vascular congestion. Bilateral basilar predominant airspace opacities greater on the left. Probable small bilateral pleural effusions. No pneumothorax. Left neck subcutaneous emphysema. IMPRESSION: 1. Stable cardiomegaly with pulmonary vascular congestion. 2. Bilateral basilar predominant airspace opacities greater on the left, which may represent atelectasis or infection. 3. Left neck subcutaneous emphysema.  No definite pneumomediastinum. Electronically Signed   By: Placido Sou M.D.   On: 05/17/2022 20:25   DG CHEST PORT 1 VIEW  Result Date: 05/17/2022 CLINICAL DATA:  Abnormal breath sounds. EXAM: PORTABLE CHEST 1 VIEW COMPARISON:  05/17/2022 FINDINGS: The endotracheal tube, feeding tube and left IJ catheters are stable. Stable cardiac enlargement, central vascular congestion, pulmonary infiltrates and left-sided subcutaneous emphysema. No pneumothorax is identified. IMPRESSION: 1. Stable support apparatus. 2. Stable cardiac enlargement, central vascular congestion and pulmonary infiltrates. Electronically Signed   By: Marijo Sanes M.D.   On: 05/17/2022 12:14   DG CHEST PORT 1 VIEW  Addendum Date: 05/17/2022   ADDENDUM  REPORT: 05/17/2022 09:56 ADDENDUM: Critical Value/emergent results were called by telephone at the time of interpretation on 05/17/2022 at 9:55 am to provider Baltazar Apo, MD, who verbally acknowledged these results. Electronically Signed   By: Kerby Moors M.D.   On: 05/17/2022 09:56   Result Date: 05/17/2022 CLINICAL DATA:  Evaluate central venous catheter placement. EXAM: PORTABLE CHEST 1 VIEW COMPARISON:  05/17/2018 for FINDINGS: Which there is a left IJ catheter with tip in the projection of the distal SVC. Unchanged from previous exam. ETT tip is stable above the carina. There is an enteric tube with tip below the GE junction. Left chest wall pacer with leads in the right atrial appendage and right ventricle. There is new pneumomediastinum. Similar soft tissue gas within the left lower neck. Gas is noted within the atelectasis is noted within the perihilar right upper lobe. Similar perihilar atelectasis within the right upper lobe. Stable bibasilar opacities compatible with atelectasis and/or airspace disease. IMPRESSION: 1. Stable support apparatus. 2. Interval development of pneumomediastinum. Gas is seen within the soft tissues of the left lower neck as before. 3. No change in bilateral pulmonary opacities. Electronically Signed: By: Kerby Moors M.D. On: 05/17/2022 07:59   Portable Chest x-ray  Result Date: 05/17/2022 CLINICAL DATA:  Sepsis.  Status post intubation. EXAM: PORTABLE CHEST 1 VIEW COMPARISON:  05/15/2018 for FINDINGS: The endotracheal tube tip is stable is unchanged in position above the carina. There is a feeding tube with tip below the field of view. Left chest wall pacer device is noted with leads in the right atrial appendage and right ventricle. Persistent bilateral airspace opacities. Compared with the previous exam there is been increasing pulmonary opacities within the perihilar right upper lobe. No signs of pleural effusion or frank interstitial edema. Gas is noted within  the soft tissues of the left lower neck, new from previous exam. Etiology and determinant. IMPRESSION: 1. New soft tissue gas noted within the left lower neck of uncertain significance. Clinical correlation is advised. 2. Persistent bilateral airspace opacities with increase in passive occasion in the perihilar left upper lobe. 3. These results will be called to the ordering clinician or representative by the Radiologist Assistant, and communication documented in the PACS or [employer]. Electronically Signed   By: Kerby Moors M.D.   On: 05/17/2022 05:46   DG Abd Portable 1V  Result  Date: 05/16/2022 CLINICAL DATA:  Feeding tube placement EXAM: PORTABLE ABDOMEN - 1 VIEW COMPARISON:  None Available. FINDINGS: Enteric feeding tube is positioned with tip just below the diaphragm in the vicinity of the gastric fundus. Nonobstructive pattern of included bowel gas. IMPRESSION: Enteric feeding tube is positioned with tip just below the diaphragm in the vicinity of the gastric fundus. Recommend advancement if post pyloric positioning is desired. Electronically Signed   By: Delanna Ahmadi M.D.   On: 05/16/2022 15:14   DG CHEST PORT 1 VIEW  Result Date: 05/15/2022 CLINICAL DATA:  77 year old female with history of acute hypoxemic respiratory failure. EXAM: PORTABLE CHEST 1 VIEW COMPARISON:  Chest x-ray 05/12/2022. FINDINGS: An endotracheal tube is in place with tip 3.7 cm above the carina. A nasogastric tube is seen extending into the stomach, however, the tip of the nasogastric tube extends below the lower margin of the image. Left-sided pacemaker device noted with lead tips projecting over the expected location of the right atrium and right ventricle. Patchy ill-defined airspace disease noted in the left mid to lower lung, and in the perihilar aspect of the right mid to upper lung concerning for multilobar bilateral bronchopneumonia. No pleural effusions. No pneumothorax. No evidence of pulmonary edema.  IMPRESSION: 1. Support apparatus, as above. 2. Findings are concerning for multilobar bilateral bronchopneumonia, as above. Electronically Signed   By: Vinnie [name] M.D.   On: 05/15/2022 10:42   DG CHEST PORT 1 VIEW  Result Date: 05/12/2022 CLINICAL DATA:  ETT [id number] Encounter for intubation [id number] EXAM: PORTABLE CHEST 1 VIEW COMPARISON:  None Available. FINDINGS: Endotracheal tube 1.9 cm from carina.  NG tube extends the stomach. LEFT-sided pacer overlies normal cardiac silhouette. Mild LEFT basilar atelectasis. No pneumothorax IMPRESSION: 1. Endotracheal tube 1.9 cm from carina. 2. NG tube in stomach. Electronically Signed   By: Suzy Bouchard M.D.   On: 05/12/2022 08:35   CT Angio Chest Pulmonary Embolism (PE) W or WO Contrast  Result Date: 05/16/2022 CLINICAL DATA:  Shortness of breath, altered mental status EXAM: CT ANGIOGRAPHY CHEST WITH CONTRAST TECHNIQUE: Multidetector CT imaging of the chest was performed using the standard protocol during bolus administration of intravenous contrast. Multiplanar CT image reconstructions and MIPs were obtained to evaluate the vascular anatomy. RADIATION DOSE REDUCTION: This exam was performed according to the departmental dose-optimization program which includes automated exposure control, adjustment of the mA and/or kV according to patient size and/or use of iterative reconstruction technique. CONTRAST:  7mL OMNIPAQUE IOHEXOL 350 MG/ML SOLN COMPARISON:  Noncontrast CT done earlier today FINDINGS: Cardiovascular: There is homogeneous enhancement in thoracic aorta. Main pulmonary artery measures 3.3 cm in diameter. There are no intraluminal filling defects in central pulmonary artery branches. Evaluation of small peripheral pulmonary artery branches is limited by motion artifacts. Cardiac pacer/defibrillator leads are noted in place. There are scattered coronary artery calcifications. Mediastinum/Nodes: There are enlarged lymph nodes in mediastinum  measuring up to 1.2 cm in short axis. Lungs/Pleura: There is moderate sized patchy alveolar infiltrate in posterior segment of right upper lobe. Small patchy infiltrates are seen in lingula and both lower lobes. There is no pleural effusion or pneumothorax. Upper Abdomen: There is fatty infiltration in liver. Gallbladder stones are seen. Small hiatal hernia is seen. Musculoskeletal: No acute findings are seen. Review of the MIP images confirms the above findings. IMPRESSION: There is no evidence of central pulmonary artery embolism. There is no evidence of thoracic aortic dissection. Patchy alveolar infiltrate is seen in right upper lobe consistent  with pneumonia. There are other smaller patchy infiltrates in the lingula and both lower lobes suggesting other smaller foci of pneumonia/atelectasis. There is no pleural effusion or pneumothorax. There are enlarged lymph nodes in mediastinum which may be related to pneumonia. Short-term follow-up CT chest in 2-3 months should be considered to rule out any underlying neoplastic process. Fatty liver. Gallbladder stones. Small hiatal hernia. Coronary artery disease. Electronically Signed   By: Elmer Picker M.D.   On: 05/08/2022 20:24   EEG adult  Result Date: 05/09/2022 Lora Havens, MD     05/18/2022  5:38 PM Patient Name: Jalexus Puhl. [place] MRN: [id number] Epilepsy Attending: Lora Havens Referring Physician/Provider: Norval Morton, MD Date: 05/21/2022 Duration: 21.30 mins Patient history: 77yo F with ams. EEG to evaluate for seizure Level of alertness:  lethargic AEDs during EEG study: LEV Technical aspects: This EEG study was done with scalp electrodes positioned according to the 10-20 International system of electrode placement. Electrical activity was reviewed with band pass filter of 1-70Hz , sensitivity of 7 uV/mm, display speed of 86mm/sec with a 60Hz  notched filter applied as appropriate. EEG data were recorded continuously and digitally stored.  Video  monitoring was available and reviewed as appropriate. Description: EEG showed continuous 5 to 7 Hz theta slowing in right hemisphere and 2-5hz  theta-delta slowing in left hemisphere, at times with triphasic morphology. Hyperventilation and photic stimulation were not performed.   ABNORMALITY - Continuous slow, generalized and lateralized left hemisphere IMPRESSION: This study is suggestive of cortical dysfunction arising from left hemisphere likely secondary to underlying structural abnormality. Additionally there is moderate diffuse encephalopathy, nonspecific etiology. No seizures or epileptiform discharges were seen throughout the recording. Priyanka Barbra Sarks   CT CHEST ABDOMEN PELVIS WO CONTRAST  Result Date: 05/18/2022 CLINICAL DATA:  77 year old female with history of sepsis. EXAM: CT CHEST, ABDOMEN AND PELVIS WITHOUT CONTRAST TECHNIQUE: Multidetector CT imaging of the chest, abdomen and pelvis was performed following the standard protocol without IV contrast. RADIATION DOSE REDUCTION: This exam was performed according to the departmental dose-optimization program which includes automated exposure control, adjustment of the mA and/or kV according to patient size and/or use of iterative reconstruction technique. COMPARISON:  Chest CTA 12/07/2019. CT of the abdomen and pelvis 12/10/2019. FINDINGS: CT CHEST FINDINGS Cardiovascular: Heart size is normal. There is no significant pericardial fluid, thickening or pericardial calcification. Atherosclerotic calcifications in the thoracic aorta as well as the left anterior descending, left circumflex and right coronary arteries. Left-sided pacemaker device in place with lead tips terminating in the right atrium and right ventricle. Mediastinum/Nodes: Mildly enlarged right paratracheal lymph node measuring 1.6 cm in short axis (axial image 16 of series 3). Borderline enlarged low right paratracheal lymph node measuring 1.3 cm in short axis. Esophagus is unremarkable in  appearance. No axillary lymphadenopathy. Lungs/Pleura: Patchy peribronchovascular airspace consolidation and ground-glass attenuation in the right upper lobe, compatible with bronchopneumonia. Lungs otherwise appear relatively clear (although today's study is limited by considerable patient respiratory motion). No pleural effusions. No definite suspicious appearing pulmonary nodules or masses are noted. Musculoskeletal: There are no aggressive appearing lytic or blastic lesions noted in the visualized portions of the skeleton. CT ABDOMEN PELVIS FINDINGS Hepatobiliary: No definite suspicious cystic or solid hepatic lesions are confidently identified on today's noncontrast CT examination. Numerous tiny calcified gallstones lie dependently in the gallbladder. Gallbladder is not distended. No pericholecystic fluid or surrounding inflammatory changes. Pancreas: No definite pancreatic mass or peripancreatic fluid collections or inflammatory changes are noted on today's  noncontrast CT examination. Spleen: Unremarkable. Adrenals/Urinary Tract: 3.2 cm low-attenuation lesion in the posterior aspect of the interpolar region of the right kidney, incompletely characterized on today's noncontrast CT examination, but similar to the prior study and statistically likely a cyst (no imaging follow-up recommended). Unenhanced appearance of the left kidney and bilateral adrenal glands is otherwise unremarkable. No hydroureteronephrosis. Urinary bladder is unremarkable in appearance. Stomach/Bowel: Unenhanced appearance of the stomach is normal. No pathologic dilatation of small bowel. There is focal dilatation of the proximal sigmoid colon which measures up to 11.6 cm in diameter. More proximal aspects of the colon are otherwise unremarkable in appearance. Postoperative changes of partial small bowel resection are noted. The appendix is not confidently identified and may be surgically absent. Regardless, there are no inflammatory  changes noted adjacent to the cecum to suggest the presence of an acute appendicitis at this time. Vascular/Lymphatic: Atherosclerotic calcifications in the abdominal aorta and pelvic vasculature. No definite lymphadenopathy noted in the abdomen or pelvis. Reproductive: Numerous partially calcified lesions are noted in the uterus, largest of which extends off the anterior aspect of the uterine fundus measuring up to 2.6 cm in diameter, presumably multifocal fibroids. Ovaries are unremarkable in appearance. Other: No significant volume of ascites.  No pneumoperitoneum. Musculoskeletal: There are no aggressive appearing lytic or blastic lesions noted in the visualized portions of the skeleton. IMPRESSION: 1. Right upper lobe bronchopneumonia. 2. Borderline enlarged and mildly enlarged right paratracheal lymph nodes, likely reactive in the setting of pneumonia. 3. Isolated dilatation of the proximal sigmoid colon is of uncertain etiology and significance, however, given the lack of more proximal bowel dilatation, this is presumably benign, and appears relatively similar to prior study from 2021. 4. Aortic atherosclerosis, in addition to three-vessel coronary artery disease. Assessment for potential risk factor modification, dietary therapy or pharmacologic therapy may be warranted, if clinically indicated. 5. Cholelithiasis without evidence of acute cholecystitis. 6. Additional incidental findings, as above. Electronically Signed   By: Vinnie [name] M.D.   On: 05/10/2022 08:18   CT Head Wo Contrast  Result Date: 05/08/2022 CLINICAL DATA:  Mental status change, unknown cause. EXAM: CT HEAD WITHOUT CONTRAST TECHNIQUE: Contiguous axial images were obtained from the base of the skull through the vertex without intravenous contrast. RADIATION DOSE REDUCTION: This exam was performed according to the departmental dose-optimization program which includes automated exposure control, adjustment of the mA and/or kV  according to patient size and/or use of iterative reconstruction technique. COMPARISON:  Head CT 12/07/2019. FINDINGS: Brain: No acute hemorrhage, mass effect or midline shift. Gray-white differentiation is preserved. No hydrocephalus. No extra-axial collection. Basilar cisterns are patent. Vascular: No hyperdense vessel or unexpected calcification. Skull: No calvarial fracture or suspicious bone lesion. Skull base is unremarkable. Sinuses/Orbits: Unremarkable. Other: None. IMPRESSION: No acute intracranial abnormality. Electronically Signed   By: Emmit Alexanders M.D.   On: 05/23/2022 08:08   DG Chest Port 1 View  Result Date: 05/07/2022 CLINICAL DATA:  77 year old female with possible sepsis. EXAM: PORTABLE CHEST 1 VIEW COMPARISON:  Chest x-ray 09/07/2020. FINDINGS: Lung volumes are low. No consolidative airspace disease. No pleural effusions. No pneumothorax. No evidence of pulmonary edema. Heart size is mildly enlarged. Upper mediastinal contours are within normal limits. Left-sided pacemaker device in place with lead tips projecting over the expected location of the right atrium and right ventricle. IMPRESSION: 1. Low lung volumes without radiographic evidence of acute cardiopulmonary disease. 2. Mild cardiomegaly. Electronically Signed   By: Vinnie [name] M.D.   On:  05/07/2022 07:20    Microbiology Recent Results (from the past 240 hour(s))  Culture, Respiratory w Gram Stain     Status: None   Collection Time: 05/17/22  2:03 PM   Specimen: Tracheal Aspirate; Respiratory  Result Value Ref Range Status   Specimen Description TRACHEAL ASPIRATE  Final   Special Requests NONE  Final   Gram Stain   Final    ABUNDANT WBC PRESENT, PREDOMINANTLY PMN RARE GRAM POSITIVE COCCI IN PAIRS    Culture   Final    FEW STAPHYLOCOCCUS AUREUS SUSCEPTIBILITIES PERFORMED ON PREVIOUS CULTURE WITHIN THE LAST 5 DAYS. Performed at [facility], [place] [address], [place], [place] [address]    Report Status  05/20/2022 FINAL  Final  Culture, blood (Routine X 2) w Reflex to ID Panel     Status: None   Collection Time: 05/17/22  2:19 PM   Specimen: BLOOD RIGHT ARM  Result Value Ref Range Status   Specimen Description BLOOD RIGHT ARM  Final   Special Requests   Final    BOTTLES DRAWN AEROBIC ONLY Blood Culture results may not be optimal due to an inadequate volume of blood received in culture bottles   Culture   Final    NO GROWTH 5 DAYS Performed at [facility], [place] [address], [place], [place] [address]    Report Status 05/22/2022 FINAL  Final  Culture, blood (Routine X 2) w Reflex to ID Panel     Status: None   Collection Time: 05/17/22  2:20 PM   Specimen: BLOOD  Result Value Ref Range Status   Specimen Description BLOOD SITE NOT SPECIFIED  Final   Special Requests   Final    BOTTLES DRAWN AEROBIC ONLY Blood Culture results may not be optimal due to an inadequate volume of blood received in culture bottles   Culture   Final    NO GROWTH 5 DAYS Performed at [facility], [place] [address], [place], [place] [address]    Report Status 05/22/2022 FINAL  Final     Baltazar Apo, MD, PhD 05/26/2022, 11:56 AM [facility] [phone] or if no answer before 7:00PM call [phone] For any issues after 7:00PM please call eLink [phone]

## 2022-06-06 NOTE — Progress Notes (Signed)
Patient's TOD was 1955, pronounced by Wilmer Floor, RN and Raynald Kemp, RN. POA notified.

## 2022-06-06 NOTE — Progress Notes (Signed)
NAME:  Grace Short, MRN:  [id number], DOB:  February 12, 1946, LOS: 8 ADMISSION DATE:  05/22/2022, CONSULTATION DATE:  05/12/2022 REFERRING MD:  Dr. Tamala Julian, CHIEF COMPLAINT:  AMS, Sepsis   History of Present Illness:  Grace Short is a 77 y.o. F with PMH of HTN, HL, CHF, COPD on 2L home O2, seizures, chronic lymphedema who was noted to be altered with labored breathing at SNF on the day of admission. EMS was called and pt was 84% on 2L.  She was brought to the ED where she was placed on non-rebreather and fever up to 101.6, she was noted to be hypercarbic on ABG so transitioned to Bipap.  She had some shaking up the upper extremities so EEG was ordered. She underwent CT of the chest /abd/pelvis showing RUL PNA.  Labs significant for normal lactic acid, covid, flu and RSV negative, no leukocytosis, BNP and troponin WNL and procalcitonin <0.1.   She was treated with cefepime, vancomycin and flagyl and admitted to the hospital medicine service, however had repeated ABG's showing respiratory acidosis and AMS, so PCCM consulted   Pertinent  Medical History  has a past medical history of Chronic acquired lymphedema, Chronic heart failure with preserved ejection fraction (HFpEF) ([place]), COPD (chronic obstructive pulmonary disease) ([place]), CVA (cerebral vascular accident) ([place]), DM (diabetes mellitus) ([place]), HLD (hyperlipidemia), Hypertension, Morbid obesity with BMI of 50.0-59.9, adult ([place]), Partial seizure disorder ([place]), and SSS (sick sinus syndrome) ([place]).   Significant Hospital Events: Including procedures, antibiotic start and stop dates in addition to other pertinent events   3/5 presented to the ED from snf with AMS and hypoxia 3/6 persistent respiratory acidosis, PCCM consult, on bipap 3/7 Intubated 3/9 - agitation 3/10 - remains agitated 3/11 - extubated 3/12 - re-intubated d/t excess secretions, unable to clear 3/13 - need for four vasopressors  Interim History / Subjective:  Yesterday  patient  Objective   Blood pressure (!) 56/24, pulse 60, temperature 98.1 F (36.7 C), temperature source Oral, resp. rate (!) 30, height '5\' 2"'$  (1.575 m), weight 117.6 kg, SpO2 (!) 74 %.    Vent Mode: PRVC FiO2 (%):  [100 %] 100 % Set Rate:  [30 bmp] 30 bmp Vt Set:  [400 mL] 400 mL PEEP:  [12 cmH20] 12 cmH20 Plateau Pressure:  [36 cmH20-41 cmH20] 40 cmH20  Peak 20  Intake/Output Summary (Last 24 hours) at  1054 Last data filed at  1000 Gross per 24 hour  Intake 7210.45 ml  Output --  Net 7210.45 ml    Filed Weights   05/16/22 2344 05/17/22 0500 05/18/22 0350  Weight: 114.5 kg 114.5 kg 117.6 kg   Examination: General: Chronically ill-appearing, no acute distress HENT: NCAT. Moist mucous membranes, ET in place Respiratory: On ventilatory support. Wheezing, rhonchi appreciated throughout.  Cardiovascular: Regular rate, rhythm. No murmurs. GI: Hard, mildly distended, but not tympanic. No bowel sounds appreciated. Extremities: Chronic lymphedema with wraps in place. Feet and hands are cold to touch, no palpable pulses appreciated.  Neuro: Somnolent, not arousable, not following commands   Resolved Hospital Problem list   Hypernatremia  Assessment & Plan:  #Septic shock #Multi-organ failure #Lactic acidosis Patient's overall clinical picture has continued to deteriorate. MAP's have consistently been <50 with SpO2 70-80's on full vasopressor and ventilatory support. We have no clear source, but likely this is septic shock. Echo yesterday without evidence of cardiogenic shock. Lactic >9, renal function continues to worsen. Discussion with family yesterday led to patient being DNR. Discussed again  with POA this morning, she would like for [place] to continue with full measures, not ready for comfort measures now. Discussed with her diagnosis of multi-organ failure and that the patient is actively dying with little to no chance of meaningful recovery. She is interested in  discussing this further with palliative care.  - Continue vasopressors as needed - Continue cefepime, flagyl - Stress-dose steroids with IV hydrocortisone '100mg'$  every 8 hours - Continue bicarb gtt 100cc/hr - Follow-up blood cultures - Continue palliative conversations, follow-up with palliative care  #Acute hypoxemic and hypercapnic respiratory failure #Pneumomediastinum  #Metapneumovirus infection No acute changes on respiratory status, still requiring full ventilatory support.  - Continue bronchodilator treatments - Lung protective strategies  - VAP bundle, PPI - Precedex, fentanyl gtt  #Oliguric acute renal failure  #Anion gap metabolic acidosis #Hyperkalemia Continues to have minimal urine output over the last 24h. Renal function continues to worsen, most likely due to ATN given significant hypotension. AGMA likely related to lactic acidosis, renal failure. Will continue with bicarb  - Continue bicarb drip 100cc/hr - Maintain adequate renal perfusion - Switch to prophylactic heparin from [facility] - Daily BMP - Strict I/O  #Type 2 diabetes Sugars stable, on trickle feeds.   #Lymphedema -Continue Unna boots w/ twice twice weekly changing -Per WOC  #History of partial seizures - On Keppra  Best Practice (right click and "Reselect all SmartList Selections" daily)   Diet/type: holding tubefeeds DVT prophylaxis: prophylactic heparin  GI prophylaxis: PPI Lines: Central line Foley:  Yes, and it is still needed Code Status:  full code Last date of multidisciplinary goals of care discussion: discussed with POA this AM  Labs   CBC: Recent Labs  Lab 05/15/22 0128 05/16/22 0230 05/17/22 0200 05/17/22 0239 05/17/22 1228 05/18/22 0025 05/18/22 0314 05/18/22 0319 05/18/22 0423 05/18/22 1050 05/18/22 1606  WBC 8.0 7.9 5.5  --  7.4  --  10.2  --   --   --   --   HGB 11.5* 11.4* 14.7   < > 12.0   < > 13.1 14.3 14.3 12.6 11.6*  HCT 37.5 38.1 48.9*   < > 39.2   < > 42.7  42.0 42.0 37.0 34.0*  MCV 83.0 85.8 84.3  --  83.8  --  84.6  --   --   --   --   PLT 127* 129* 141*  --  137*  --  162  --   --   --   --    < > = values in this interval not displayed.     Basic Metabolic Panel: Recent Labs  Lab 05/13/22 1633 05/15/22 0128 05/16/22 0230 05/17/22 0200 05/17/22 0239 05/17/22 1746 05/18/22 0025 05/18/22 0314 05/18/22 0319 05/18/22 0857 05/18/22 1050 05/18/22 1606 05/18/22 2013 05/18/22 2336  0314  NA  --  152* 149* 146*   < > 138   < > 138   < > 141 138 140 138  --  138  K  --  3.7 4.4 4.2   < > 4.5   < > 5.2*   < > 5.5* 5.9* 5.5* 6.1* 5.5* 5.6*  CL  --  119* 115* 114*   < > 104  --  106  --  107  --   --  103  --  101  CO2  --  '24 25 23   '$ < > 18*  --  15*  --  18*  --   --  9*  --  8*  GLUCOSE  --  178* 177* 111*   < > 197*  --  107*  --  101*  --   --  86  --  136*  BUN  --  51* 43* 35*   < > 43*  --  46*  --  49*  --   --  54*  --  54*  CREATININE  --  0.62 0.62 0.77   < > 1.64*  --  1.88*  --  2.16*  --   --  2.58*  --  2.79*  CALCIUM  --  8.9 8.8* 9.0   < > 8.3*  --  8.5*  --  8.0*  --   --  8.2*  --  8.2*  MG 2.3 2.2 2.0 2.1  --   --   --  2.2  --   --   --   --   --   --   --   PHOS 2.7 2.3* 2.8 2.7  --   --   --  4.7*  --   --   --   --   --   --   --    < > = values in this interval not displayed.    GFR: Estimated Creatinine Clearance: 20.6 mL/min (A) (by C-G formula based on SCr of 2.79 mg/dL (H)). Recent Labs  Lab 05/16/22 0230 05/17/22 0200 05/17/22 1228 05/17/22 1715 05/18/22 0314 05/18/22 0822 05/18/22 1050 05/18/22 2013 05/18/22 2339  WBC 7.9 5.5 7.4  --  10.2  --   --   --   --   LATICACIDVEN  --   --  4.9*   < >  --  6.5* 6.8* >9.0* >9.0*   < > = values in this interval not displayed.     Liver Function Tests: No results for input(s): "AST", "ALT", "ALKPHOS", "BILITOT", "PROT", "ALBUMIN" in the last 168 hours.  No results for input(s): "LIPASE", "AMYLASE" in the last 168 hours. No results for  input(s): "AMMONIA" in the last 168 hours.   ABG    Component Value Date/Time   PHART 7.016 (LL) 05/18/2022 1606   PCO2ART 51.4 (H) 05/18/2022 1606   PO2ART 74 (L) 05/18/2022 1606   HCO3 13.0 (L) 05/18/2022 1606   TCO2 14 (L) 05/18/2022 1606   ACIDBASEDEF 17.0 (H) 05/18/2022 1606   O2SAT 83 05/18/2022 1606     Coagulation Profile: No results for input(s): "INR", "PROTIME" in the last 168 hours.   Cardiac Enzymes: No results for input(s): "CKTOTAL", "CKMB", "CKMBINDEX", "TROPONINI" in the last 168 hours.  HbA1C: Hgb A1c MFr Bld  Date/Time Value Ref Range Status  05/06/2022 10:57 PM 7.3 (H) 4.8 - 5.6 % Final    Comment:    (NOTE)         Prediabetes: 5.7 - 6.4         Diabetes: >6.4         Glycemic control for adults with diabetes: <7.0   09/07/2020 05:35 AM 6.8 (H) 4.8 - 5.6 % Final    Comment:    (NOTE)         Prediabetes: 5.7 - 6.4         Diabetes: >6.4         Glycemic control for adults with diabetes: <7.0     CBG: Recent Labs  Lab 05/18/22 1629 05/18/22 1937  0011  0329  0739  GLUCAP 86 73 122* 130* 96     Review  of Systems:     Past Medical History:  She,  has a past medical history of Chronic acquired lymphedema, Chronic heart failure with preserved ejection fraction (HFpEF) ([place]), COPD (chronic obstructive pulmonary disease) ([place]), CVA (cerebral vascular accident) ([place]), DM (diabetes mellitus) ([place]), HLD (hyperlipidemia), Hypertension, Morbid obesity with BMI of 50.0-59.9, adult ([place]), Partial seizure disorder ([place]), and SSS (sick sinus syndrome) ([place]).   Surgical History:   Past Surgical History:  Procedure Laterality Date   APPENDECTOMY     BREAST SURGERY     colectomy     rectal surgery   HERNIA REPAIR     PACEMAKER IMPLANT     TONSILLECTOMY       Social History:   reports that she has never smoked. She has never used smokeless tobacco. She reports that she does not drink alcohol and does not use drugs.    Family History:  Her family history includes Diabetes in her sister; Heart disease in her sister.   Allergies Allergies  Allergen Reactions   Ativan [Lorazepam] Other (See Comments)    "Stopped breathing"    Sanjuan Dame, MD Internal Medicine PGY-3 Pager: [phone]

## 2022-06-06 NOTE — Progress Notes (Signed)
Remaining 25 cc of Fentanyl wasted.100cc of Fentanyl wasted from new bag that is unable to be returned to pharmacy. Remaining 50 cc of Versed wasted. All wastes were witnessed by Satira Mccallum, RN.

## 2022-06-06 DEATH — deceased
